# Patient Record
Sex: Female | Born: 1975
Health system: Southern US, Community
[De-identification: ages and names within clinical notes are randomized; demographics above are authoritative.]

## PROBLEM LIST (undated history)

## (undated) DIAGNOSIS — G4700 Insomnia, unspecified: Secondary | ICD-10-CM

## (undated) DIAGNOSIS — Z973 Presence of spectacles and contact lenses: Secondary | ICD-10-CM

## (undated) DIAGNOSIS — J302 Other seasonal allergic rhinitis: Secondary | ICD-10-CM

## (undated) DIAGNOSIS — F411 Generalized anxiety disorder: Secondary | ICD-10-CM

## (undated) DIAGNOSIS — A609 Anogenital herpesviral infection, unspecified: Secondary | ICD-10-CM

## (undated) DIAGNOSIS — Z860101 Personal history of adenomatous and serrated colon polyps: Secondary | ICD-10-CM

## (undated) DIAGNOSIS — F4310 Post-traumatic stress disorder, unspecified: Secondary | ICD-10-CM

## (undated) DIAGNOSIS — F4312 Post-traumatic stress disorder, chronic: Secondary | ICD-10-CM

## (undated) DIAGNOSIS — J449 Chronic obstructive pulmonary disease, unspecified: Secondary | ICD-10-CM

## (undated) DIAGNOSIS — T148XXA Other injury of unspecified body region, initial encounter: Secondary | ICD-10-CM

## (undated) DIAGNOSIS — F3132 Bipolar disorder, current episode depressed, moderate: Secondary | ICD-10-CM

## (undated) DIAGNOSIS — G2581 Restless legs syndrome: Secondary | ICD-10-CM

## (undated) DIAGNOSIS — F32A Depression, unspecified: Secondary | ICD-10-CM

## (undated) DIAGNOSIS — F319 Bipolar disorder, unspecified: Secondary | ICD-10-CM

## (undated) DIAGNOSIS — F419 Anxiety disorder, unspecified: Secondary | ICD-10-CM

## (undated) DIAGNOSIS — J45991 Cough variant asthma: Secondary | ICD-10-CM

## (undated) DIAGNOSIS — K219 Gastro-esophageal reflux disease without esophagitis: Secondary | ICD-10-CM

## (undated) HISTORY — PX: WISDOM TOOTH EXTRACTION: SHX21

## (undated) HISTORY — PX: OTHER SURGICAL HISTORY: SHX169

## (undated) HISTORY — DX: Anogenital herpesviral infection, unspecified: A60.9

## (undated) HISTORY — DX: Other injury of unspecified body region, initial encounter: T14.8XXA

## (undated) HISTORY — DX: Depression, unspecified: F32.A

## (undated) HISTORY — PX: BARTHOLIN CYST MARSUPIALIZATION: SHX5383

## (undated) HISTORY — DX: Other seasonal allergic rhinitis: J30.2

## (undated) HISTORY — DX: Restless legs syndrome: G25.81

## (undated) HISTORY — DX: Anxiety disorder, unspecified: F41.9

---

## 2001-11-22 ENCOUNTER — Other Ambulatory Visit: Admission: RE | Admit: 2001-11-22 | Discharge: 2001-11-22 | Payer: Self-pay | Admitting: Gynecology

## 2002-06-21 ENCOUNTER — Other Ambulatory Visit: Admission: RE | Admit: 2002-06-21 | Discharge: 2002-06-21 | Payer: Self-pay | Admitting: Gynecology

## 2003-01-23 ENCOUNTER — Other Ambulatory Visit: Admission: RE | Admit: 2003-01-23 | Discharge: 2003-01-23 | Payer: Self-pay | Admitting: Gynecology

## 2003-07-04 ENCOUNTER — Other Ambulatory Visit: Admission: RE | Admit: 2003-07-04 | Discharge: 2003-07-04 | Payer: Self-pay | Admitting: Gynecology

## 2004-03-28 ENCOUNTER — Other Ambulatory Visit: Admission: RE | Admit: 2004-03-28 | Discharge: 2004-03-28 | Payer: Self-pay | Admitting: Gynecology

## 2005-04-04 ENCOUNTER — Other Ambulatory Visit: Admission: RE | Admit: 2005-04-04 | Discharge: 2005-04-04 | Payer: Self-pay | Admitting: Gynecology

## 2005-09-16 ENCOUNTER — Inpatient Hospital Stay (HOSPITAL_COMMUNITY): Admission: RE | Admit: 2005-09-16 | Discharge: 2005-09-20 | Payer: Self-pay | Admitting: Psychiatry

## 2005-09-17 ENCOUNTER — Ambulatory Visit: Payer: Self-pay | Admitting: Psychiatry

## 2005-09-21 ENCOUNTER — Inpatient Hospital Stay (HOSPITAL_COMMUNITY): Admission: RE | Admit: 2005-09-21 | Discharge: 2005-09-25 | Payer: Self-pay | Admitting: Psychiatry

## 2006-04-21 ENCOUNTER — Other Ambulatory Visit: Admission: RE | Admit: 2006-04-21 | Discharge: 2006-04-21 | Payer: Self-pay | Admitting: Gynecology

## 2006-05-04 ENCOUNTER — Other Ambulatory Visit (HOSPITAL_COMMUNITY): Admission: RE | Admit: 2006-05-04 | Discharge: 2006-06-03 | Payer: Self-pay | Admitting: Psychiatry

## 2006-05-06 ENCOUNTER — Ambulatory Visit: Payer: Self-pay | Admitting: Psychiatry

## 2006-10-11 ENCOUNTER — Inpatient Hospital Stay (HOSPITAL_COMMUNITY): Admission: AD | Admit: 2006-10-11 | Discharge: 2006-10-15 | Payer: Self-pay | Admitting: Psychiatry

## 2006-10-11 ENCOUNTER — Ambulatory Visit: Payer: Self-pay | Admitting: Psychiatry

## 2006-10-16 ENCOUNTER — Other Ambulatory Visit (HOSPITAL_COMMUNITY): Admission: RE | Admit: 2006-10-16 | Discharge: 2007-01-14 | Payer: Self-pay | Admitting: Psychiatry

## 2013-12-15 HISTORY — PX: BARTHOLIN CYST MARSUPIALIZATION: SHX5383

## 2014-07-05 ENCOUNTER — Encounter (HOSPITAL_COMMUNITY): Payer: Self-pay | Admitting: Emergency Medicine

## 2014-07-05 ENCOUNTER — Emergency Department (INDEPENDENT_AMBULATORY_CARE_PROVIDER_SITE_OTHER)
Admission: EM | Admit: 2014-07-05 | Discharge: 2014-07-05 | Disposition: A | Discharge status: Home / Self Care | Payer: Managed Care, Other (non HMO) | Source: Home / Self Care | Attending: Family Medicine | Admitting: Family Medicine

## 2014-07-05 DIAGNOSIS — N898 Other specified noninflammatory disorders of vagina: Secondary | ICD-10-CM

## 2014-07-05 DIAGNOSIS — R102 Pelvic and perineal pain: Secondary | ICD-10-CM

## 2014-07-05 DIAGNOSIS — N949 Unspecified condition associated with female genital organs and menstrual cycle: Secondary | ICD-10-CM

## 2014-07-05 HISTORY — DX: Bipolar disorder, unspecified: F31.9

## 2014-07-05 LAB — POCT URINALYSIS DIP (DEVICE)
Bilirubin Urine: NEGATIVE
GLUCOSE, UA: NEGATIVE mg/dL
Ketones, ur: NEGATIVE mg/dL
Leukocytes, UA: NEGATIVE
Nitrite: NEGATIVE
PROTEIN: NEGATIVE mg/dL
SPECIFIC GRAVITY, URINE: 1.015 (ref 1.005–1.030)
UROBILINOGEN UA: 0.2 mg/dL (ref 0.0–1.0)
pH: 7 (ref 5.0–8.0)

## 2014-07-05 LAB — POCT PREGNANCY, URINE: Preg Test, Ur: NEGATIVE

## 2014-07-05 MED ORDER — SODIUM BICARBONATE 4 % IV SOLN
INTRAVENOUS | Status: AC
  Filled 2014-07-05: qty 5

## 2014-07-05 MED ORDER — SULFAMETHOXAZOLE-TMP DS 800-160 MG PO TABS: 1.0000 | ORAL_TABLET | Freq: Two times a day (BID) | ORAL | Status: DC

## 2014-07-05 NOTE — ED Provider Notes (Signed)
CSN: [id number]     Arrival date & time 07/05/14  1332 History   First MD Initiated Contact with Patient 07/05/14 1414     Chief Complaint  Patient presents with  . Cyst   (Consider location/radiation/quality/duration/timing/severity/associated sxs/prior Treatment) HPI Comments: 38 year old female presents complaining of a lump on the right side of her vagina. This is been present for about 4 days and is extremely painful to touch. The swelling has gotten gradually worse. There has been no drainage. She has never had this before. No other symptoms. She has been doing sitz baths and applying antibiotic cream at home with no relief   Past Medical History  Diagnosis Date  . Bipolar disorder    History reviewed. No pertinent past surgical history. History reviewed. No pertinent family history. History  Substance Use Topics  . Smoking status: Never Smoker   . Smokeless tobacco: Not on file  . Alcohol Use: Not on file   OB History   Grav Para Term Preterm Abortions TAB SAB Ect Mult Living                 Review of Systems  Genitourinary: Positive for vaginal pain (see history of present illness).  All other systems reviewed and are negative.   Allergies  Review of patient's allergies indicates no known allergies.  Home Medications   Prior to Admission medications   Medication Sig Start Date End Date Taking? Authorizing Provider  FLUoxetine (PROZAC) 20 MG capsule Take 20 mg by mouth daily.   Yes Historical Provider, MD  lamoTRIgine (LAMICTAL) 100 MG tablet Take 75 mg by mouth daily.   Yes Historical Provider, MD  LORazepam (ATIVAN) 2 MG tablet Take 4 mg by mouth every 6 (six) hours as needed for anxiety.   Yes Historical Provider, MD  QUEtiapine (SEROQUEL XR) 400 MG 24 hr tablet Take 400 mg by mouth at bedtime.   Yes Historical Provider, MD  rOPINIRole (REQUIP) 3 MG tablet Take 3 mg by mouth at bedtime.   Yes Historical Provider, MD  zolpidem (AMBIEN) 10 MG tablet Take 10 mg by  mouth at bedtime as needed for sleep.   Yes Historical Provider, MD  sulfamethoxazole-trimethoprim (BACTRIM DS) 800-160 MG per tablet Take 1 tablet by mouth 2 (two) times daily. 07/05/14   Liam Graham, PA-C   BP 111/75  Pulse 88  Temp(Src) 99.4 F (37.4 C) (Oral)  Resp 14  SpO2 97% Physical Exam  Nursing note and vitals reviewed. Constitutional: She is oriented to person, place, and time. Vital signs are normal. She appears well-developed and well-nourished. No distress.  HENT:  Head: Normocephalic and atraumatic.  Pulmonary/Chest: Effort normal. No respiratory distress.  Genitourinary:    There is tenderness on the right labia.  Neurological: She is alert and oriented to person, place, and time. She has normal strength. Coordination normal.  Skin: Skin is warm and dry. No rash noted. She is not diaphoretic.  Psychiatric: She has a normal mood and affect. Judgment normal.    ED Course  Procedures (including critical care time) Labs Review Labs Reviewed  POCT URINALYSIS DIP (DEVICE) - Abnormal; Notable for the following:    Hgb urine dipstick SMALL (*)    All other components within normal limits  POCT PREGNANCY, URINE    Imaging Review No results found.  The tissue was prepped with Betadine swabs x2 and allowed to dry. Field block with local anesthetic, 2% lidocaine with epinephrine mixed 1-10 with bicarb, was injected around the  area with with good anesthesia obtained. A more thorough exam and revealed a firm nodule below this area of swelling that extends superiorly. A single straight incision was made into the area of fluctuance, and there was no drainage or purulence as expected. This incision was expanded slightly with blunt dissection and then probed a bit more, and there is no area to be drained.   MDM   1. Vaginal lesion   2. Vaginal pain    I cannot explain exactly what is causing this patient's symptoms. I expected there to be an abscess, but there was none.  This was too anterior/superior to be a Bartholin's gland in normal anatomy. There may be an infection without an abscess and she just has swelling, there may be a superficial phlebitis or thrombosis. Will attempt to treat this with Bactrim twice a day, if no improvement in 3-4 days she will followup with GYN or just at the [facility] hospital. Continue sitz baths  Discharge Medication List as of 07/05/2014  3:46 PM    START taking these medications   Details  sulfamethoxazole-trimethoprim (BACTRIM DS) 800-160 MG per tablet Take 1 tablet by mouth 2 (two) times daily., Starting 07/05/2014, Until Discontinued, Print           Liam Graham, PA-C 07/05/14 2220

## 2014-07-05 NOTE — ED Notes (Signed)
C/o vaginal irritation & "lump" x 4 days. Minimal relief w OTC and home treatments, antibiotic cream

## 2014-07-07 NOTE — ED Notes (Signed)
Message on answering machine from patient, asking for pain medication, as she was not provided w a Rx . Spoke w Dr Georgina Snell, who dis not see pt (Z Baker not here) patient offered option of returning to [facility] for recheck or to go to [facility] for recheck , if her pain is worsening. Discussed w patient, who seemed agreeable w plan

## 2014-07-07 NOTE — ED Provider Notes (Signed)
Medical screening examination/treatment/procedure(s) were performed by a resident physician or non-physician practitioner and as the supervising physician I was immediately available for consultation/collaboration.  Lynne Leader, MD    Gregor Hams, MD 07/07/14 [phone]

## 2014-07-10 ENCOUNTER — Encounter (HOSPITAL_COMMUNITY): Payer: Self-pay | Admitting: Emergency Medicine

## 2014-07-10 ENCOUNTER — Inpatient Hospital Stay (HOSPITAL_COMMUNITY)
Admission: AD | Admit: 2014-07-10 | Discharge: 2014-07-10 | Discharge status: Against Medical Advice | Payer: Managed Care, Other (non HMO) | Source: Ambulatory Visit | Attending: Obstetrics & Gynecology | Admitting: Obstetrics & Gynecology

## 2014-07-10 ENCOUNTER — Emergency Department (INDEPENDENT_AMBULATORY_CARE_PROVIDER_SITE_OTHER)
Admission: EM | Admit: 2014-07-10 | Discharge: 2014-07-10 | Disposition: A | Discharge status: Home / Self Care | Payer: Managed Care, Other (non HMO) | Source: Home / Self Care | Attending: Family Medicine | Admitting: Family Medicine

## 2014-07-10 DIAGNOSIS — Z5189 Encounter for other specified aftercare: Secondary | ICD-10-CM

## 2014-07-10 DIAGNOSIS — Z4800 Encounter for change or removal of nonsurgical wound dressing: Secondary | ICD-10-CM

## 2014-07-10 NOTE — Discharge Instructions (Signed)
Your wound is healing very well and i would continue sitz baths at home. You may use tylenol, ibuprofen or naproxen as directed on packaging for pain. If symptoms become suddenly worse or severe before you return home, please have yourself re-evaluated at [facility].

## 2014-07-10 NOTE — ED Provider Notes (Signed)
CSN: [id number]     Arrival date & time 07/10/14  1416 History   First MD Initiated Contact with Patient 07/10/14 1430     Chief Complaint  Patient presents with  . Wound Check   (Consider location/radiation/quality/duration/timing/severity/associated sxs/prior Treatment) HPI Comments: Was seen for I&D of small vaginal lesion on 07-05-2014 and is here for re-check. Reports area is still tender but that she is taking tylenol at home for pain.   Patient is a 38 y.o. female presenting with wound check. The history is provided by the patient.  Wound Check    Past Medical History  Diagnosis Date  . Bipolar disorder    History reviewed. No pertinent past surgical history. History reviewed. No pertinent family history. History  Substance Use Topics  . Smoking status: Never Smoker   . Smokeless tobacco: Not on file  . Alcohol Use: Not on file   OB History   Grav Para Term Preterm Abortions TAB SAB Ect Mult Living                 Review of Systems  All other systems reviewed and are negative.   Allergies  Review of patient's allergies indicates no known allergies.  Home Medications   Prior to Admission medications   Medication Sig Start Date End Date Taking? Authorizing Provider  FLUoxetine (PROZAC) 20 MG capsule Take 20 mg by mouth daily.    Historical Provider, MD  lamoTRIgine (LAMICTAL) 100 MG tablet Take 75 mg by mouth daily.    Historical Provider, MD  LORazepam (ATIVAN) 2 MG tablet Take 4 mg by mouth every 6 (six) hours as needed for anxiety.    Historical Provider, MD  QUEtiapine (SEROQUEL XR) 400 MG 24 hr tablet Take 400 mg by mouth at bedtime.    Historical Provider, MD  rOPINIRole (REQUIP) 3 MG tablet Take 3 mg by mouth at bedtime.    Historical Provider, MD  sulfamethoxazole-trimethoprim (BACTRIM DS) 800-160 MG per tablet Take 1 tablet by mouth 2 (two) times daily. 07/05/14   Liam Graham, PA-C  zolpidem (AMBIEN) 10 MG tablet Take 10 mg by mouth at bedtime as  needed for sleep.    Historical Provider, MD   BP 112/74  Pulse 87  Temp(Src) 98.4 F (36.9 C) (Oral)  Resp 20  Ht 5\' 7"  (1.702 m)  Wt 125 lb (56.7 kg)  BMI 19.57 kg/m2  SpO2 96% Physical Exam  Nursing note and vitals reviewed. Constitutional: She is oriented to person, place, and time. She appears well-developed and well-nourished. No distress.  HENT:  Head: Normocephalic and atraumatic.  Eyes: Conjunctivae are normal.  Cardiovascular: Normal rate.   Pulmonary/Chest: Effort normal.  Genitourinary:    Pelvic exam was performed with patient supine.  Musculoskeletal: Normal range of motion.  Neurological: She is alert and oriented to person, place, and time.  Skin: Skin is warm and dry. No rash noted. No erythema.    ED Course  Procedures (including critical care time) Labs Review Labs Reviewed - No data to display  Imaging Review No results found.   MDM   1. Encounter for wound re-check   Wound healing well and without issue. Advised to continue sitz baths as needed and may continue to use tylenol, ibuprofen, or naproxen for pain as directed on packaging.     [place], [place] 07/10/14 1540

## 2014-07-10 NOTE — ED Notes (Signed)
Recheck of perineal lesion w I&D done 7-22. C/o continued pain in her perineal area

## 2014-07-11 NOTE — ED Provider Notes (Signed)
Medical screening examination/treatment/procedure(s) were performed by resident physician or non-physician practitioner and as supervising physician I was immediately available for consultation/collaboration.   Pauline Good MD.   Billy Fischer, MD 07/11/14 [phone]

## 2015-01-21 DIAGNOSIS — F4310 Post-traumatic stress disorder, unspecified: Secondary | ICD-10-CM | POA: Insufficient documentation

## 2016-02-22 ENCOUNTER — Emergency Department (HOSPITAL_COMMUNITY)
Admission: EM | Admit: 2016-02-22 | Discharge: 2016-02-22 | Disposition: A | Discharge status: Home / Self Care | Payer: Commercial Managed Care - HMO | Source: Home / Self Care | Attending: Emergency Medicine | Admitting: Emergency Medicine

## 2016-02-22 ENCOUNTER — Encounter (HOSPITAL_COMMUNITY): Payer: Self-pay | Admitting: *Deleted

## 2016-02-22 DIAGNOSIS — N390 Urinary tract infection, site not specified: Secondary | ICD-10-CM

## 2016-02-22 DIAGNOSIS — N898 Other specified noninflammatory disorders of vagina: Secondary | ICD-10-CM

## 2016-02-22 DIAGNOSIS — R300 Dysuria: Secondary | ICD-10-CM | POA: Diagnosis not present

## 2016-02-22 LAB — POCT URINALYSIS DIP (DEVICE)
BILIRUBIN URINE: NEGATIVE
GLUCOSE, UA: NEGATIVE mg/dL
Hgb urine dipstick: NEGATIVE
Ketones, ur: NEGATIVE mg/dL
NITRITE: NEGATIVE
PH: 6.5 (ref 5.0–8.0)
Protein, ur: NEGATIVE mg/dL
Specific Gravity, Urine: 1.01 (ref 1.005–1.030)
Urobilinogen, UA: 0.2 mg/dL (ref 0.0–1.0)

## 2016-02-22 LAB — POCT PREGNANCY, URINE: PREG TEST UR: NEGATIVE

## 2016-02-22 MED ORDER — FLUCONAZOLE 150 MG PO TABS: 150.0000 mg | ORAL_TABLET | Freq: Once | ORAL | Status: DC

## 2016-02-22 MED ORDER — NITROFURANTOIN MONOHYD MACRO 100 MG PO CAPS: 100.0000 mg | ORAL_CAPSULE | Freq: Two times a day (BID) | ORAL | Status: DC

## 2016-02-22 MED ORDER — PHENAZOPYRIDINE HCL 200 MG PO TABS: 200.0000 mg | ORAL_TABLET | Freq: Three times a day (TID) | ORAL | Status: DC | PRN

## 2016-02-22 NOTE — ED Notes (Signed)
Pt  Reports  Symptoms  Of  Frequency  And  Burning  On  Urination  With  Onset  Of  Symptoms     Yesterday    Pt  Thinks  She  May  Have  A  Yeast  Infection  As  She  Has  Some  Whitish  Discharge  As  Well

## 2016-02-22 NOTE — ED Provider Notes (Signed)
HPI  SUBJECTIVE:  Beth [name] is a 40 y.o. female who presents with UTI like symptoms and a symptoms suggestive of a yeast infection starting yesterday. She reports urinary urgency, frequency, cloudy urine, dysuria, questionable hematuria. She reports non-odorous white vaginal discharge, vulvar itching starting yesterday as well. She reports some occasional nausea, no vomiting or fevers. She tried Azo, Monistat, ice pack. States that ice pack helps. Symptoms are worse with urination. No odorous urine. No genital rash. No  vulvar redness. No abdominal pain, back pain. No recent antibiotics. She is in a long-term monogamous relationship with a female who is asymptomatic. STDs are not a concern today. She states that today's symptoms feel identical to previous yeast infections. Past medical history of yeast infections. No history of UTI, pyelonephritis, nephrolithiasis, diabetes, hypertension. No history of gonorrhea, chlamydia, herpes, HIV, syphilis, BV, Trichomonas. LMP: Earlier this week. PMD: Dr. Bryon Lions.  Past Medical History  Diagnosis Date  . Bipolar disorder ([place])     History reviewed. No pertinent past surgical history.  History reviewed. No pertinent family history.  Social History  Substance Use Topics  . Smoking status: Never Smoker   . Smokeless tobacco: None  . Alcohol Use: None    No current facility-administered medications for this encounter.  Current outpatient prescriptions:  .  ziprasidone (GEODON) 40 MG capsule, Take 40 mg by mouth 2 (two) times daily with a meal., Disp: , Rfl:  .  fluconazole (DIFLUCAN) 150 MG tablet, Take 1 tablet (150 mg total) by mouth once. 1 tab po x 1. May repeat in 72 hours if no improvement, Disp: 2 tablet, Rfl: 1 .  FLUoxetine (PROZAC) 20 MG capsule, Take 20 mg by mouth daily., Disp: , Rfl:  .  lamoTRIgine (LAMICTAL) 100 MG tablet, Take 75 mg by mouth daily., Disp: , Rfl:  .  LORazepam (ATIVAN) 2 MG tablet, Take 4 mg by mouth every 6  (six) hours as needed for anxiety., Disp: , Rfl:  .  nitrofurantoin, macrocrystal-monohydrate, (MACROBID) 100 MG capsule, Take 1 capsule (100 mg total) by mouth 2 (two) times daily. X 5 days, Disp: 10 capsule, Rfl: 0 .  phenazopyridine (PYRIDIUM) 200 MG tablet, Take 1 tablet (200 mg total) by mouth 3 (three) times daily as needed for pain., Disp: 6 tablet, Rfl: 0 .  QUEtiapine (SEROQUEL XR) 400 MG 24 hr tablet, Take 400 mg by mouth at bedtime., Disp: , Rfl:  .  rOPINIRole (REQUIP) 3 MG tablet, Take 3 mg by mouth at bedtime., Disp: , Rfl:  .  zolpidem (AMBIEN) 10 MG tablet, Take 10 mg by mouth at bedtime as needed for sleep., Disp: , Rfl:   No Known Allergies   ROS  As noted in HPI.   Physical Exam  BP 117/72 mmHg  Pulse 67  Temp(Src) 97.7 F (36.5 C) (Oral)  SpO2 100%  LMP 02/17/2016  Constitutional: Well developed, well nourished, no acute distress Eyes:  EOMI, conjunctiva normal bilaterally HENT: Normocephalic, atraumatic,mucus membranes moist Respiratory: Normal inspiratory effort Cardiovascular: Normal rate GI: nondistended soft, nontender. No suprapubic tenderness  back: No CVA tenderness GU: Deferred skin: No rash, skin intact Musculoskeletal: no deformities Neurologic: Alert & oriented x 3, no focal neuro deficits Psychiatric: Speech and behavior appropriate   ED Course   Medications - No data to display  Orders Placed This Encounter  Procedures  . POCT urinalysis dip (device)    Standing Status: Standing     Number of Occurrences: 1     Standing  Expiration Date:   . Pregnancy, urine POC    Standing Status: Standing     Number of Occurrences: 1     Standing Expiration Date:     Results for orders placed or performed during the hospital encounter of 02/22/16 (from the past 24 hour(s))  POCT urinalysis dip (device)     Status: Abnormal   Collection Time: 02/22/16  1:39 PM  Result Value Ref Range   Glucose, UA NEGATIVE NEGATIVE mg/dL   Bilirubin Urine  NEGATIVE NEGATIVE   Ketones, ur NEGATIVE NEGATIVE mg/dL   Specific Gravity, Urine 1.010 1.005 - 1.030   Hgb urine dipstick NEGATIVE NEGATIVE   pH 6.5 5.0 - 8.0   Protein, ur NEGATIVE NEGATIVE mg/dL   Urobilinogen, UA 0.2 0.0 - 1.0 mg/dL   Nitrite NEGATIVE NEGATIVE   Leukocytes, UA SMALL (A) NEGATIVE  Pregnancy, urine POC     Status: None   Collection Time: 02/22/16  1:41 PM  Result Value Ref Range   Preg Test, Ur NEGATIVE NEGATIVE   No results found.  ED Clinical Impression  Dysuria  UTI (lower urinary tract infection)  Vaginal discharge   ED Assessment/Plan  UA has small leukocytes. No hematuria. She is not pregnant.  Patient declined pelvic exam and STD/vaginitis testing. Feel the patient is low risk enough that this can be deferred. She wishes to try treating empirically for UTI and yeast infection. Discussed with patient that we would not be able to rule everything else without doing a pelvic exam, however, patient opted to defer this. If she does not get better in several days, she will return here or follow-up with her primary care physician to be reevaluated.   History and labs most consistent with UTI perhaps with a yeast infection. Will  treat empirically for both with Macrobid and Diflucan. Follow-up with primary care or return here if not getting better. Pt agrees with plan.    *This clinic note was created using Dragon dictation software. Therefore, there may be occasional mistakes despite careful proofreading.  ?     Melynda Ripple, MD 02/22/16 1400

## 2016-08-22 ENCOUNTER — Ambulatory Visit: Payer: Self-pay | Admitting: Gynecology

## 2016-09-15 ENCOUNTER — Ambulatory Visit: Payer: Self-pay | Admitting: Gynecology

## 2016-10-09 ENCOUNTER — Ambulatory Visit: Payer: Self-pay | Admitting: Gynecology

## 2017-01-23 DIAGNOSIS — R69 Illness, unspecified: Secondary | ICD-10-CM | POA: Diagnosis not present

## 2017-03-20 ENCOUNTER — Ambulatory Visit (INDEPENDENT_AMBULATORY_CARE_PROVIDER_SITE_OTHER): Payer: Medicare HMO | Admitting: Gynecology

## 2017-03-20 ENCOUNTER — Encounter: Payer: Self-pay | Admitting: Gynecology

## 2017-03-20 VITALS — BP 112/76 | Ht 67.0 in | Wt 138.0 lb

## 2017-03-20 DIAGNOSIS — Z01419 Encounter for gynecological examination (general) (routine) without abnormal findings: Secondary | ICD-10-CM | POA: Diagnosis not present

## 2017-03-20 DIAGNOSIS — N979 Female infertility, unspecified: Secondary | ICD-10-CM

## 2017-03-20 DIAGNOSIS — Z1151 Encounter for screening for human papillomavirus (HPV): Secondary | ICD-10-CM

## 2017-03-20 DIAGNOSIS — Z8349 Family history of other endocrine, nutritional and metabolic diseases: Secondary | ICD-10-CM

## 2017-03-20 DIAGNOSIS — Z13220 Encounter for screening for lipoid disorders: Secondary | ICD-10-CM | POA: Diagnosis not present

## 2017-03-20 LAB — CBC WITH DIFFERENTIAL/PLATELET
Basophils Absolute: 114 cells/uL (ref 0–200)
Basophils Relative: 2 %
EOS ABS: 399 {cells}/uL (ref 15–500)
Eosinophils Relative: 7 %
HCT: 42.5 % (ref 35.0–45.0)
Hemoglobin: 13.8 g/dL (ref 11.7–15.5)
Lymphocytes Relative: 38 %
Lymphs Abs: 2166 cells/uL (ref 850–3900)
MCH: 30.3 pg (ref 27.0–33.0)
MCHC: 32.5 g/dL (ref 32.0–36.0)
MCV: 93.4 fL (ref 80.0–100.0)
MPV: 10.2 fL (ref 7.5–12.5)
Monocytes Absolute: 570 cells/uL (ref 200–950)
Monocytes Relative: 10 %
Neutro Abs: 2451 cells/uL (ref 1500–7800)
Neutrophils Relative %: 43 %
PLATELETS: 353 10*3/uL (ref 140–400)
RBC: 4.55 MIL/uL (ref 3.80–5.10)
RDW: 12.9 % (ref 11.0–15.0)
WBC: 5.7 10*3/uL (ref 3.8–10.8)

## 2017-03-20 LAB — COMPREHENSIVE METABOLIC PANEL
ALT: 8 U/L (ref 6–29)
AST: 13 U/L (ref 10–30)
Albumin: 4.7 g/dL (ref 3.6–5.1)
Alkaline Phosphatase: 65 U/L (ref 33–115)
BUN: 8 mg/dL (ref 7–25)
CHLORIDE: 106 mmol/L (ref 98–110)
CO2: 25 mmol/L (ref 20–31)
Calcium: 9.8 mg/dL (ref 8.6–10.2)
Creat: 0.88 mg/dL (ref 0.50–1.10)
GLUCOSE: 88 mg/dL (ref 65–99)
Potassium: 4.2 mmol/L (ref 3.5–5.3)
Sodium: 138 mmol/L (ref 135–146)
Total Bilirubin: 0.5 mg/dL (ref 0.2–1.2)
Total Protein: 7.2 g/dL (ref 6.1–8.1)

## 2017-03-20 LAB — LIPID PANEL
Cholesterol: 198 mg/dL (ref <200)
HDL: 63 mg/dL (ref >50)
LDL Cholesterol: 116 mg/dL — ABNORMAL HIGH (ref <100)
Total CHOL/HDL Ratio: 3.1 Ratio (ref <5.0)
Triglycerides: 94 mg/dL (ref <150)
VLDL: 19 mg/dL (ref <30)

## 2017-03-20 LAB — TSH: TSH: 0.96 mIU/L

## 2017-03-20 NOTE — Addendum Note (Signed)
Addended by: Nelva Nay on: 03/20/2017 11:21 AM   Modules accepted: Orders

## 2017-03-20 NOTE — Patient Instructions (Signed)
Call to Schedule your mammogram  Facilities in [place]: 1)  [facility]. [facility], [address] Phone: [phone] 2)  Dr. Bertrand at [facility]  [address] Phone: [phone]     Mammogram A mammogram is an X-ray test to find changes in a woman's breast. You should get a mammogram if:  You are 42 years of age or older  You have risk factors.   Your doctor recommends that you have one.  BEFORE THE TEST  Do not schedule the test the week before your period, especially if your breasts are sore during this time.  On the day of your mammogram:  Wash your breasts and armpits well. After washing, do not put on any deodorant or talcum powder on until after your test.   Eat and drink as you usually do.   Take your medicines as usual.   If you are diabetic and take insulin, make sure you:   Eat before coming for your test.   Take your insulin as usual.   If you cannot keep your appointment, call before the appointment to cancel. Schedule another appointment.  TEST  You will need to undress from the waist up. You will put on a hospital gown.   Your breast will be put on the mammogram machine, and it will press firmly on your breast with a piece of plastic called a compression paddle. This will make your breast flatter so that the machine can X-ray all parts of your breast.   Both breasts will be X-rayed. Each breast will be X-rayed from above and from the side. An X-ray might need to be taken again if the picture is not good enough.   The mammogram will last about 15 to 30 minutes.  AFTER THE TEST Finding out the results of your test Ask when your test results will be ready. Make sure you get your test results.  Document Released: 02/27/2009 Document Revised: 11/20/2011 Document Reviewed: 02/27/2009 Exit[employer] Patient Information 2012 [employer].   

## 2017-03-20 NOTE — Addendum Note (Signed)
Addended by: Joaquin Music on: 03/20/2017 03:46 PM   Modules accepted: Orders

## 2017-03-20 NOTE — Progress Notes (Signed)
    Beth [name] 08-03-1976 [id number]        41 y.o.  G0P0000 for annual exam.  Has not been in the office for a number of years.  Past medical history,surgical history, problem list, medications, allergies, family history and social history were all reviewed and documented as reviewed in the EPIC chart.  ROS:  Performed with pertinent positives and negatives included in the history, assessment and plan.   Additional significant findings :  None   Exam: Caryn Bee assistant Vitals:   03/20/17 1033  BP: 112/76  Weight: 138 lb (62.6 kg)  Height: 5\' 7"  (1.702 m)   Body mass index is 21.61 kg/m.  General appearance:  Normal affect, orientation and appearance. Skin: Grossly normal HEENT: Without gross lesions.  No cervical or supraclavicular adenopathy. Thyroid normal.  Lungs:  Clear without wheezing, rales or rhonchi Cardiac: RR, without RMG Abdominal:  Soft, nontender, without masses, guarding, rebound, organomegaly or hernia Breasts:  Examined lying and sitting without masses, retractions, discharge or axillary adenopathy. Pelvic:  Ext, BUS, Vagina: Normal  Cervix: Normal. Pap smear/HPV  Uterus: Anteverted, normal size, shape and contour, midline and mobile nontender   Adnexa: Without masses or tenderness    Anus and perineum: Normal   Rectovaginal: Normal sphincter tone without palpated masses or tenderness.    Assessment/Plan:  41 y.o. G0P0000 female for annual exam with regular menses no contraception.   1. Contraception. Patient has undergone extensive infertility workup to include failed IVF. Does not want to use contraception and would accept pregnancy if it occurs. Understands that she is on a number of medications that may have adverse effects as far as fetus and she accepts this. Declines referral as far as infertility/prenatal counseling with maternal-fetal. She did ask about checking her fertility reserve. Offered AMH screen and she wants to do this. 2. Pap smear  reported 2 years ago. No records from this. Pap smear/HPV done today. No history of significant abnormal Pap smears previously. 3. Mammography never. Recommended baseline screening mammography with names and numbers provided and she will call to arrange. SBE monthly reviewed. 4. Health maintenance. Patient requests baseline labs. CBC, CMP, lipid profile, TSH due to family history of thyroid dysfunction and urinalysis ordered along with her AMH. Follow up in one year sooner as needed.   Anastasio Auerbach MD, 10:57 AM 03/20/2017

## 2017-03-20 NOTE — Addendum Note (Signed)
Addended by: Joaquin Music on: 03/20/2017 11:52 AM   Modules accepted: Orders

## 2017-03-26 LAB — PAP IG AND HPV HIGH-RISK: HPV DNA HIGH RISK: NOT DETECTED

## 2017-08-27 ENCOUNTER — Other Ambulatory Visit: Payer: Self-pay | Admitting: Gynecology

## 2017-08-27 DIAGNOSIS — Z1231 Encounter for screening mammogram for malignant neoplasm of breast: Secondary | ICD-10-CM

## 2017-09-18 ENCOUNTER — Ambulatory Visit: Payer: Commercial Managed Care - HMO

## 2017-09-24 ENCOUNTER — Ambulatory Visit: Payer: Commercial Managed Care - HMO

## 2018-05-15 DIAGNOSIS — A609 Anogenital herpesviral infection, unspecified: Secondary | ICD-10-CM

## 2018-05-15 HISTORY — DX: Anogenital herpesviral infection, unspecified: A60.9

## 2018-05-28 ENCOUNTER — Encounter: Payer: Self-pay | Admitting: Gynecology

## 2018-05-28 ENCOUNTER — Ambulatory Visit: Payer: Medicare HMO | Admitting: Gynecology

## 2018-05-28 VITALS — BP 118/76

## 2018-05-28 DIAGNOSIS — R102 Pelvic and perineal pain: Secondary | ICD-10-CM | POA: Diagnosis not present

## 2018-05-28 NOTE — Progress Notes (Signed)
    Meleny Tregoning [birth year] [id number]        42 y.o.  G0P0000 resents complaining of questionable labial cyst.  Had pain at the introital opening of the last several weeks.  No precipitating events.  No drainage, discharge, irritation or odor.  Feels uncomfortable to the touch.  Past medical history,surgical history, problem list, medications, allergies, family history and social history were all reviewed and documented in the EPIC chart.  Directed ROS with pertinent positives and negatives documented in the history of present illness/assessment and plan.  Exam: Caryn Bee assistant Vitals:   05/28/18 1009  BP: 118/76   General appearance:  Normal External BUS vagina grossly normal.  The area she is pointing to is at the left posterior fourchette region.  There is no mucosal changes such as erythema, ulcerations or skin breaks.  No palpable underlying induration or masses.  Vaginal exam is normal.  Cervix normal.  Uterus normal size midline mobile nontender.  Adnexa without masses or tenderness  Assessment/Plan:  42 y.o. G0P0000 with reported discomfort at the introital opening on the left.  No evidence of disease on exam.  We discussed the differential to include viral although no skin breaks or other changes to suggest HSV outbreak.  She really has no classic presentation for any etiology such as infectious/boil/Bartholin's.  Very superficial skin and not vaginismus like.  I recommended the patient apply heat to this area and to monitor for now.  She will follow-up if it persists or certainly worsens.  If it totally resolves then will follow expectantly.    Anastasio Auerbach MD, 10:32 AM 05/28/2018

## 2018-05-28 NOTE — Patient Instructions (Signed)
Follow-up if the pain continues.

## 2018-05-31 ENCOUNTER — Telehealth: Payer: Self-pay | Admitting: *Deleted

## 2018-05-31 MED ORDER — FLUCONAZOLE 150 MG PO TABS: 150.0000 mg | ORAL_TABLET | Freq: Once | ORAL | 0 refills | Status: AC

## 2018-05-31 NOTE — Telephone Encounter (Signed)
Rx sent, pt aware 

## 2018-05-31 NOTE — Telephone Encounter (Signed)
Patient called to follow up from [place] on 05/28/18 c/o vaginal itching internally/externally along with irritation, no discharge, no odor. Pt asked if she could have Rx for possible yeast infection? Please advise

## 2018-05-31 NOTE — Telephone Encounter (Signed)
Diflucan 150 mg x 1 dose okay. 

## 2018-06-04 ENCOUNTER — Encounter: Payer: Self-pay | Admitting: Gynecology

## 2018-06-04 ENCOUNTER — Ambulatory Visit: Payer: Medicare PPO | Admitting: Gynecology

## 2018-06-04 ENCOUNTER — Telehealth: Payer: Self-pay | Admitting: *Deleted

## 2018-06-04 VITALS — BP 118/76

## 2018-06-04 DIAGNOSIS — N766 Ulceration of vulva: Secondary | ICD-10-CM

## 2018-06-04 MED ORDER — VALACYCLOVIR HCL 500 MG PO TABS
500.0000 mg | ORAL_TABLET | Freq: Two times a day (BID) | ORAL | 0 refills | Status: DC
Start: 2018-06-04 — End: 2018-06-08

## 2018-06-04 MED ORDER — LIDOCAINE 5 % EX OINT: 1.0000 "application " | TOPICAL_OINTMENT | CUTANEOUS | 0 refills | Status: DC | PRN

## 2018-06-04 NOTE — Telephone Encounter (Signed)
Pt informed will be here in 30 minutes.

## 2018-06-04 NOTE — Patient Instructions (Signed)
Take the Valtrex medication twice daily for 10 days. Apply the lidocaine ointment to the tender areas as needed. Office will call you with the culture results.

## 2018-06-04 NOTE — Progress Notes (Signed)
    Sala Tague [birth year] [id number]        42 y.o.  G0P0000 presents with continuing vulvar pain.  She was seen 05/28/2018 with left lower vulvar pain although no findings on physical exam.  She now presents with right upper vulvar pain and a "white" area.  Past medical history,surgical history, problem list, medications, allergies, family history and social history were all reviewed and documented in the EPIC chart.  Directed ROS with pertinent positives and negatives documented in the history of present illness/assessment and plan.  Exam: Caryn Bee assistant Vitals:   06/04/18 1117  BP: 118/76   General appearance:  Normal External BUS vagina with punctated ulcer upper right labia minora extremely tender to touch.  No cellulitis surrounding.  Tender right inguinal area without significant adenopathy.  With  Assessment/Plan:  42 y.o. 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  PCR testing done.  Discussed my suspicion with the patient.  She understands I am not guaranteeing this is the diagnosis but suspicious for this.  I suspect that the left discomfort she was having previously might have been an abortive outbreak without cutaneous manifestation.  Recommend patient start on Valtrex 500 mg twice daily x1 week.  Lidocaine ointment as needed for her discomfort.  Follow-up on the PCR results.  We had a lengthy discussion about HSV and various options to include daily suppressive Valtrex versus intermittent outbreak treatment.  Patient will follow-up for the PCR results and then will further discuss at that time.    Anastasio Auerbach MD, 11:34 AM 06/04/2018

## 2018-06-04 NOTE — Telephone Encounter (Signed)
Have her come right in 

## 2018-06-04 NOTE — Addendum Note (Signed)
Addended by: Nelva Nay on: 06/04/2018 12:00 PM   Modules accepted: Orders

## 2018-06-04 NOTE — Telephone Encounter (Signed)
Patient called back to follow up from [place] on 05/28/18, states now she has a white bump Vulvar area, painful with wiping and walking, using warming compressions and no relief. Would you like to work patient in for [place] today? Please advise

## 2018-06-07 LAB — SURESWAB HSV, TYPE 1/2 DNA, PCR
HSV 1 DNA: NOT DETECTED
HSV 2 DNA: DETECTED — AB

## 2018-06-08 ENCOUNTER — Other Ambulatory Visit: Payer: Self-pay | Admitting: Gynecology

## 2018-06-08 ENCOUNTER — Encounter: Payer: Self-pay | Admitting: Gynecology

## 2018-06-08 MED ORDER — VALACYCLOVIR HCL 500 MG PO TABS: ORAL_TABLET | ORAL | 6 refills | Status: DC

## 2018-06-14 ENCOUNTER — Telehealth: Payer: Self-pay

## 2018-06-14 MED ORDER — VALACYCLOVIR HCL 1 G PO TABS: 1000.0000 mg | ORAL_TABLET | Freq: Two times a day (BID) | ORAL | 0 refills | Status: DC

## 2018-06-14 NOTE — Telephone Encounter (Signed)
Patient informed. Rx sent 

## 2018-06-14 NOTE — Addendum Note (Signed)
Addended by: Ramond Craver on: 06/14/2018 02:44 PM   Modules accepted: Orders

## 2018-06-14 NOTE — Telephone Encounter (Signed)
I would increase the Valtrex to 1000 mg twice daily x10 days.

## 2018-06-14 NOTE — Telephone Encounter (Signed)
Patient just finished Valtrex x 10 days. She is still experiencing symptoms and is still very uncomfortable. It really did not help much. She said what concerns her is on the left side if feels like it is coming back again. Feels like it felt when it first started.

## 2018-07-12 ENCOUNTER — Ambulatory Visit: Payer: Medicare PPO | Admitting: Gynecology

## 2018-07-12 ENCOUNTER — Encounter: Payer: Self-pay | Admitting: Gynecology

## 2018-07-12 VITALS — BP 122/78 | Ht 67.0 in | Wt 152.0 lb

## 2018-07-12 DIAGNOSIS — Z01419 Encounter for gynecological examination (general) (routine) without abnormal findings: Secondary | ICD-10-CM

## 2018-07-12 DIAGNOSIS — Z9189 Other specified personal risk factors, not elsewhere classified: Secondary | ICD-10-CM | POA: Diagnosis not present

## 2018-07-12 DIAGNOSIS — Z13220 Encounter for screening for lipoid disorders: Secondary | ICD-10-CM

## 2018-07-12 DIAGNOSIS — A609 Anogenital herpesviral infection, unspecified: Secondary | ICD-10-CM | POA: Diagnosis not present

## 2018-07-12 LAB — COMPREHENSIVE METABOLIC PANEL
AG Ratio: 2.2 (calc) (ref 1.0–2.5)
ALBUMIN MSPROF: 4.8 g/dL (ref 3.6–5.1)
ALT: 43 U/L — ABNORMAL HIGH (ref 6–29)
AST: 28 U/L (ref 10–30)
Alkaline phosphatase (APISO): 75 U/L (ref 33–115)
BILIRUBIN TOTAL: 0.4 mg/dL (ref 0.2–1.2)
BUN: 11 mg/dL (ref 7–25)
CALCIUM: 9.4 mg/dL (ref 8.6–10.2)
CO2: 27 mmol/L (ref 20–32)
CREATININE: 0.69 mg/dL (ref 0.50–1.10)
Chloride: 103 mmol/L (ref 98–110)
Globulin: 2.2 g/dL (calc) (ref 1.9–3.7)
Glucose, Bld: 94 mg/dL (ref 65–99)
POTASSIUM: 4.3 mmol/L (ref 3.5–5.3)
Sodium: 137 mmol/L (ref 135–146)
Total Protein: 7 g/dL (ref 6.1–8.1)

## 2018-07-12 LAB — CBC WITH DIFFERENTIAL/PLATELET
Basophils Absolute: 88 cells/uL (ref 0–200)
Basophils Relative: 1.4 %
EOS ABS: 239 {cells}/uL (ref 15–500)
Eosinophils Relative: 3.8 %
HEMATOCRIT: 40.4 % (ref 35.0–45.0)
Hemoglobin: 14 g/dL (ref 11.7–15.5)
LYMPHS ABS: 1884 {cells}/uL (ref 850–3900)
MCH: 31.9 pg (ref 27.0–33.0)
MCHC: 34.7 g/dL (ref 32.0–36.0)
MCV: 92 fL (ref 80.0–100.0)
MPV: 10.8 fL (ref 7.5–12.5)
Monocytes Relative: 9.4 %
NEUTROS PCT: 55.5 %
Neutro Abs: 3497 cells/uL (ref 1500–7800)
PLATELETS: 336 10*3/uL (ref 140–400)
RBC: 4.39 10*6/uL (ref 3.80–5.10)
RDW: 12.8 % (ref 11.0–15.0)
TOTAL LYMPHOCYTE: 29.9 %
WBC: 6.3 10*3/uL (ref 3.8–10.8)
WBCMIX: 592 {cells}/uL (ref 200–950)

## 2018-07-12 LAB — LIPID PANEL
CHOL/HDL RATIO: 3.3 (calc) (ref <5.0)
Cholesterol: 198 mg/dL (ref <200)
HDL: 60 mg/dL (ref >50)
LDL Cholesterol (Calc): 117 mg/dL (calc) — ABNORMAL HIGH
Non-HDL Cholesterol (Calc): 138 mg/dL (calc) — ABNORMAL HIGH (ref <130)
Triglycerides: 106 mg/dL (ref <150)

## 2018-07-12 MED ORDER — VALACYCLOVIR HCL 500 MG PO TABS: 500.0000 mg | ORAL_TABLET | Freq: Every day | ORAL | 12 refills | Status: DC

## 2018-07-12 NOTE — Progress Notes (Signed)
    Beth [name] 01-12-1976 [id number]        42 y.o.  G0P0000 for annual gynecologic exam.  Recently diagnosed with HSV.  Past medical history,surgical history, problem list, medications, allergies, family history and social history were all reviewed and documented as reviewed in the EPIC chart.  ROS:  Performed with pertinent positives and negatives included in the history, assessment and plan.   Additional significant findings : None   Exam: Wandra Scot assistant Vitals:   07/12/18 0832  BP: 122/78  Weight: 152 lb (68.9 kg)  Height: 5\' 7"  (1.702 m)   Body mass index is 23.81 kg/m.  General appearance:  Normal affect, orientation and appearance. Skin: Grossly normal HEENT: Without gross lesions.  No cervical or supraclavicular adenopathy. Thyroid normal.  Lungs:  Clear without wheezing, rales or rhonchi Cardiac: RR, without RMG Abdominal:  Soft, nontender, without masses, guarding, rebound, organomegaly or hernia Breasts:  Examined lying and sitting without masses, retractions, discharge or axillary adenopathy. Pelvic:  Ext, BUS, Vagina: Normal  Cervix: Normal  Uterus: Anteverted, normal size, shape and contour, midline and mobile nontender   Adnexa: Without masses or tenderness    Anus and perineum: Normal   Rectovaginal: Normal sphincter tone without palpated masses or tenderness.    Assessment/Plan:  42 y.o. G0P0000 female for annual gynecologic exam with regular menses, no contraception.   1. No contraception.  Patient accepting pregnancy if recurs.  Is on multivitamin with folic acid.  Underwent infertility evaluation with failed IVF. 2. Pap smear/HPV 2018.  No Pap smear done today.  No history of abnormal Pap smears previously.  Plan repeat Pap smear/HPV at 5-year interval per current screening guidelines 3. Mammography never.  Again strongly recommended screening mammography.  Breast exam normal today. 4. HSV.  Recent vulvar outbreak with diagnoses.  PCR HSV-2  positive.  We discussed again options to include intermittent outbreak treatment with Valtrex versus daily suppression.  At this point the patient feels more comfortable with daily suppression.  Valtrex 500 mg daily refill x1 year provided. 5. Health maintenance.  CBC, CMP, lipid profile ordered.  Follow-up 1 year, sooner as needed.   Anastasio Auerbach MD, 8:58 AM 07/12/2018

## 2018-07-12 NOTE — Patient Instructions (Addendum)
Start on the Valtrex daily as we discussed.  Call me if you have any issues with this.  Follow-up in 1 year for annual exam   Call to Schedule your mammogram  Facilities in [place]: 1)  [facility]. [address] [place] Phone: [phone])  Dr. Isaiah Blakes at [facility] [address] Phone: [phone]     Mammogram A mammogram is an X-ray test to find changes in a woman's breast. You should get a mammogram if:  You are 18 years of age or older  You have risk factors.   Your doctor recommends that you have one.  BEFORE THE TEST  Do not schedule the test the week before your period, especially if your breasts are sore during this time.  On the day of your mammogram:  Wash your breasts and armpits well. After washing, do not put on any deodorant or talcum powder on until after your test.   Eat and drink as you usually do.   Take your medicines as usual.   If you are diabetic and take insulin, make sure you:   Eat before coming for your test.   Take your insulin as usual.   If you cannot keep your appointment, call before the appointment to cancel. Schedule another appointment.  TEST  You will need to undress from the waist up. You will put on a hospital gown.   Your breast will be put on the mammogram machine, and it will press firmly on your breast with a piece of plastic called a compression paddle. This will make your breast flatter so that the machine can X-ray all parts of your breast.   Both breasts will be X-rayed. Each breast will be X-rayed from above and from the side. An X-ray might need to be taken again if the picture is not good enough.   The mammogram will last about 15 to 30 minutes.  AFTER THE TEST Finding out the results of your test Ask when your test results will be ready. Make sure you get your test results.  Document Released: 02/27/2009 Document Revised: 11/20/2011  Document Reviewed: 02/27/2009 [facility] Patient Information 2012 [place].

## 2018-07-13 ENCOUNTER — Other Ambulatory Visit: Payer: Self-pay | Admitting: Gynecology

## 2018-07-13 DIAGNOSIS — R7989 Other specified abnormal findings of blood chemistry: Secondary | ICD-10-CM

## 2018-07-13 DIAGNOSIS — R945 Abnormal results of liver function studies: Principal | ICD-10-CM

## 2018-07-29 ENCOUNTER — Other Ambulatory Visit: Payer: Self-pay | Admitting: Gynecology

## 2018-07-29 DIAGNOSIS — Z1231 Encounter for screening mammogram for malignant neoplasm of breast: Secondary | ICD-10-CM

## 2018-08-19 ENCOUNTER — Telehealth: Payer: Self-pay | Admitting: *Deleted

## 2018-08-19 MED ORDER — VALACYCLOVIR HCL 500 MG PO TABS: ORAL_TABLET | ORAL | 4 refills | Status: DC

## 2018-08-19 NOTE — Telephone Encounter (Signed)
Patient called stating she takes the Valtex 500 mg daily, outbreak started last night. Asked what to do I told her she can take Rx twice daily for 3-5 days and then go back to daily. She asked if I could send new Rx in for with these directions. Rx sent for 3 month supply.

## 2018-08-25 ENCOUNTER — Ambulatory Visit: Payer: Commercial Managed Care - HMO

## 2018-09-02 ENCOUNTER — Other Ambulatory Visit: Payer: Self-pay | Admitting: Psychiatry

## 2018-09-07 LAB — HEPATIC FUNCTION PANEL
AG RATIO: 1.9 (calc) (ref 1.0–2.5)
ALT: 13 U/L (ref 6–29)
AST: 19 U/L (ref 10–30)
Albumin: 4.9 g/dL (ref 3.6–5.1)
Alkaline phosphatase (APISO): 63 U/L (ref 33–115)
BILIRUBIN DIRECT: 0.1 mg/dL (ref 0.0–0.2)
BILIRUBIN INDIRECT: 0.5 mg/dL (ref 0.2–1.2)
BILIRUBIN TOTAL: 0.6 mg/dL (ref 0.2–1.2)
GLOBULIN: 2.6 g/dL (ref 1.9–3.7)
Total Protein: 7.5 g/dL (ref 6.1–8.1)

## 2018-09-07 LAB — VITAMIN B12: Vitamin B-12: 343 pg/mL (ref 200–1100)

## 2018-09-07 LAB — IRON, TOTAL/TOTAL IRON BINDING CAP
%SAT: 19 % (calc) (ref 16–45)
Iron: 69 ug/dL (ref 40–190)
TIBC: 361 mcg/dL (calc) (ref 250–450)

## 2018-09-07 LAB — LAMOTRIGINE LEVEL: LAMOTRIGINE LVL: 3 ug/mL — AB (ref 4.0–18.0)

## 2018-09-10 ENCOUNTER — Encounter: Payer: Self-pay | Admitting: *Deleted

## 2018-09-17 ENCOUNTER — Encounter: Payer: Self-pay | Admitting: Neurology

## 2018-09-17 ENCOUNTER — Ambulatory Visit: Payer: Medicare PPO | Admitting: Psychiatry

## 2018-09-17 ENCOUNTER — Encounter: Payer: Self-pay | Admitting: Psychiatry

## 2018-09-17 VITALS — BP 97/62 | HR 61 | Ht 67.0 in

## 2018-09-17 DIAGNOSIS — F4310 Post-traumatic stress disorder, unspecified: Secondary | ICD-10-CM | POA: Diagnosis not present

## 2018-09-17 DIAGNOSIS — G2581 Restless legs syndrome: Secondary | ICD-10-CM

## 2018-09-17 DIAGNOSIS — F411 Generalized anxiety disorder: Secondary | ICD-10-CM

## 2018-09-17 DIAGNOSIS — F315 Bipolar disorder, current episode depressed, severe, with psychotic features: Secondary | ICD-10-CM

## 2018-09-17 DIAGNOSIS — F5101 Primary insomnia: Secondary | ICD-10-CM

## 2018-09-17 MED ORDER — LAMOTRIGINE 200 MG PO TABS: 300.0000 mg | ORAL_TABLET | Freq: Every day | ORAL | 2 refills | Status: DC

## 2018-09-17 MED ORDER — CLONAZEPAM 0.5 MG PO TABS: ORAL_TABLET | ORAL | 1 refills | Status: DC

## 2018-09-17 MED ORDER — ESZOPICLONE 3 MG PO TABS: 3.0000 mg | ORAL_TABLET | Freq: Every day | ORAL | 1 refills | Status: DC

## 2018-09-17 MED ORDER — DOXAZOSIN MESYLATE 2 MG PO TABS: ORAL_TABLET | ORAL | 1 refills | Status: DC

## 2018-09-17 MED ORDER — DIVALPROEX SODIUM ER 500 MG PO TB24
500.0000 mg | ORAL_TABLET | Freq: Every day | ORAL | 1 refills | Status: DC
Start: 2018-09-17 — End: 2018-09-22

## 2018-09-17 MED ORDER — SERTRALINE HCL 100 MG PO TABS: 100.0000 mg | ORAL_TABLET | Freq: Every day | ORAL | 2 refills | Status: DC

## 2018-09-17 MED ORDER — ROPINIROLE HCL 4 MG PO TABS: 8.0000 mg | ORAL_TABLET | Freq: Every day | ORAL | 2 refills | Status: DC

## 2018-09-17 NOTE — Progress Notes (Signed)
Beth [name] [id number] 09/06/1976 42 y.o.  Subjective:   Patient ID:  Beth [name] is a 42 y.o. (DOB 1976-11-09) female.  Chief Complaint:  Chief Complaint  Patient presents with  . Depression  . Anxiety  . Insomnia    HPI Beth [name] presents to the office today for follow-up of mood, anxiety, and insomnia. Reports that she is repeatedly awakening at night and has difficulty returning to sleep. Reports frequent nightmares and that this significantly contributes to sleep disturbance.  Reports intrusive memories and flashbacks. Denies change in appetite.  She reports worsening RLS and leg pain. Describes mood as "more depressed." Denies any change in irritability. Describes anxiety as "high." Energy has been low and c/o feeling "tired." Motivation is also low. She c/o difficulty with concentration and focus. Denies any recent hypomanic s/s to include impulsive or risky behaviors. Reports having SI recently without intent. Denies current SI. Denies paranoia.  Medications: I have reviewed the patient's current medications. Side effects include RLS and n/v.  Allergies: No Known Allergies  Past Medical History:  Diagnosis Date  . Bipolar disorder ([place])   . HSV (herpes simplex virus) anogenital infection 05/2018  . RLS (restless legs syndrome)     Past Surgical History:  Procedure Laterality Date  . BARTHOLIN CYST MARSUPIALIZATION    . IVF      Family History  Problem Relation Age of Onset  . Thyroid disease Mother   . COPD Mother   . Bipolar disorder Mother   . Parkinson's disease Father   . Breast cancer Paternal Grandmother 57  . Bipolar disorder Sister   . Schizophrenia Other     Social History   Socioeconomic History  . Marital status: Married    Spouse name: Not on file  . Number of children: Not on file  . Years of education: Not on file  . Highest education level: Not on file  Occupational History  . Not on file  Social Needs  . Financial resource  strain: Not on file  . Food insecurity:    Worry: Not on file    Inability: Not on file  . Transportation needs:    Medical: Not on file    Non-medical: Not on file  Tobacco Use  . Smoking status: Former Research scientist (life sciences)  . Smokeless tobacco: Never Used  Substance and Sexual Activity  . Alcohol use: No  . Drug use: No  . Sexual activity: Yes    Birth control/protection: None    Comment: 1st intercourse 17 yo-5 partners  Lifestyle  . Physical activity:    Days per week: Not on file    Minutes per session: Not on file  . Stress: Not on file  Relationships  . Social connections:    Talks on phone: Not on file    Gets together: Not on file    Attends religious service: Not on file    Active member of club or organization: Not on file    Attends meetings of clubs or organizations: Not on file    Relationship status: Not on file  . Intimate partner violence:    Fear of current or ex partner: Not on file    Emotionally abused: Not on file    Physically abused: Not on file    Forced sexual activity: Not on file  Other Topics Concern  . Not on file  Social History Narrative  . Not on file    Past Medical History, Surgical history, Social history, and Family history were  reviewed and updated as appropriate.   Please see review of systems for further details on the patient's review from today.   Review of Systems:  Review of Systems  Gastrointestinal: Positive for vomiting.  Musculoskeletal: Negative for gait problem.  Allergic/Immunologic: Negative.   Neurological: Negative for tremors.       Reports worsening RLS and legs hurting    Objective:   Physical Exam:  BP 97/62   Pulse 61   Ht 5\' 7"  (1.702 m)   BMI 23.81 kg/m   Physical Exam  Constitutional: She is oriented to person, place, and time. She appears well-developed. No distress.  Musculoskeletal: Normal range of motion.  Neurological: She is alert and oriented to person, place, and time. Coordination normal.   Psychiatric: Her speech is normal. Judgment and thought content normal. Her mood appears anxious. Her affect is not angry, not blunt, not labile and not inappropriate. She is slowed and withdrawn. Cognition and memory are normal. She exhibits a depressed mood. She expresses no homicidal and no suicidal ideation. She expresses no suicidal plans and no homicidal plans.    Lab Review:     Component Value Date/Time   NA 137 07/12/2018 0859   K 4.3 07/12/2018 0859   CL 103 07/12/2018 0859   CO2 27 07/12/2018 0859   GLUCOSE 94 07/12/2018 0859   BUN 11 07/12/2018 0859   CREATININE 0.69 07/12/2018 0859   CALCIUM 9.4 07/12/2018 0859   PROT 7.5 09/02/2018 1058   ALBUMIN 4.7 03/20/2017 1100   AST 19 09/02/2018 1058   ALT 13 09/02/2018 1058   ALKPHOS 65 03/20/2017 1100   BILITOT 0.6 09/02/2018 1058       Component Value Date/Time   WBC 6.3 07/12/2018 0859   RBC 4.39 07/12/2018 0859   HGB 14.0 07/12/2018 0859   HCT 40.4 07/12/2018 0859   PLT 336 07/12/2018 0859   MCV 92.0 07/12/2018 0859   MCH 31.9 07/12/2018 0859   MCHC 34.7 07/12/2018 0859   RDW 12.8 07/12/2018 0859   LYMPHSABS 1,884 07/12/2018 0859   MONOABS 570 03/20/2017 1100   EOSABS 239 07/12/2018 0859   BASOSABS 88 07/12/2018 0859     Assessment: Plan:    Patient seen for 30 minutes and greater than 50% of visit spent counseling patient regarding potential treatment options to include ECT considering limited improvement in signs and symptoms with multiple medications.  Case was staffed with Dr. Clovis Pu who recommended having patient scheduled to see him for an hour for a second opinion.  Will also request neurology consult for RLE's that has been unimproved with Requip, gabapentin, Lyrica, benzodiazepines, or reduction in atypical antipsychotics.  Will decrease Depakote ER to 500 mg at bedtime since this may be causing her nausea.  Will increase lamotrigine to 300 mg p.o. daily since patient reports that lamotrigine may have  been the most beneficial for her mood symptoms.  We will also start doxazosin 2 mg one half tab p.o. nightly for 1 week, then increase to 1 tablet p.o. nightly if tolerated for nightmares since patient reports that nightmares significantly interfere with her sleep.  Discussed with patient that doxazosin could lower blood pressure and pulse and to discontinue if dizziness occurs or pulse is low.  Patient reports that she will monitor her pulse and discontinue if pulse is lower than 60.  Patient to follow-up with this provider in 4 to 5 weeks or sooner if clinically indicated. Bipolar disorder, current episode depressed, severe, with psychotic features ([place]) -  Plan: divalproex (DEPAKOTE ER) 500 MG 24 hr tablet, lamoTRIgine (LAMICTAL) 200 MG tablet, sertraline (ZOLOFT) 100 MG tablet  Primary insomnia - Plan: doxazosin (CARDURA) 2 MG tablet, Eszopiclone (ESZOPICLONE) 3 MG TABS  Generalized anxiety disorder - Plan: clonazePAM (KLONOPIN) 0.5 MG tablet, sertraline (ZOLOFT) 100 MG tablet  PTSD (post-traumatic stress disorder) - Plan: doxazosin (CARDURA) 2 MG tablet, sertraline (ZOLOFT) 100 MG tablet  RLS (restless legs syndrome) - Plan: rOPINIRole (REQUIP) 4 MG tablet, Ambulatory referral to Neurology  Please see After Visit Summary for patient specific instructions.  Future Appointments  Date Time Provider [place]  09/21/2018  4:20 PM [facility]    Orders Placed This Encounter  Procedures  . Ambulatory referral to Neurology      -------------------------------

## 2018-09-20 ENCOUNTER — Other Ambulatory Visit: Payer: Self-pay

## 2018-09-21 ENCOUNTER — Telehealth: Payer: Self-pay | Admitting: Psychiatry

## 2018-09-21 ENCOUNTER — Ambulatory Visit
Admission: RE | Admit: 2018-09-21 | Discharge: 2018-09-21 | Disposition: A | Discharge status: Home / Self Care | Payer: Commercial Managed Care - HMO | Source: Ambulatory Visit | Attending: Gynecology | Admitting: Gynecology

## 2018-09-21 DIAGNOSIS — Z1231 Encounter for screening mammogram for malignant neoplasm of breast: Secondary | ICD-10-CM

## 2018-09-21 DIAGNOSIS — F411 Generalized anxiety disorder: Secondary | ICD-10-CM

## 2018-09-21 DIAGNOSIS — F315 Bipolar disorder, current episode depressed, severe, with psychotic features: Secondary | ICD-10-CM

## 2018-09-21 MED ORDER — CLONAZEPAM 0.5 MG PO TABS: ORAL_TABLET | ORAL | 0 refills | Status: DC

## 2018-09-21 MED ORDER — ASENAPINE MALEATE 5 MG SL SUBL: 5.0000 mg | SUBLINGUAL_TABLET | Freq: Every day | SUBLINGUAL | 1 refills | Status: DC

## 2018-09-21 NOTE — Telephone Encounter (Signed)
Pt called and reported to staff that she is not sleeping and asks about resuming Saphris. She also reports clarification is needed on Klonopin QID since quantity sent was #30.   Saphris 5 mg SL QHS sent and Klonopin QID #120 sent to pharmacy and pt notified in My Chart.

## 2018-09-22 ENCOUNTER — Other Ambulatory Visit: Payer: Self-pay

## 2018-09-22 DIAGNOSIS — F315 Bipolar disorder, current episode depressed, severe, with psychotic features: Secondary | ICD-10-CM

## 2018-09-22 MED ORDER — DIVALPROEX SODIUM ER 250 MG PO TB24: 750.0000 mg | ORAL_TABLET | Freq: Every day | ORAL | 0 refills | Status: DC

## 2018-09-29 ENCOUNTER — Telehealth: Payer: Self-pay | Admitting: *Deleted

## 2018-09-29 ENCOUNTER — Other Ambulatory Visit: Payer: Self-pay | Admitting: Gynecology

## 2018-09-29 ENCOUNTER — Other Ambulatory Visit: Payer: Self-pay

## 2018-09-29 DIAGNOSIS — F315 Bipolar disorder, current episode depressed, severe, with psychotic features: Secondary | ICD-10-CM

## 2018-09-29 MED ORDER — DIVALPROEX SODIUM ER 250 MG PO TB24: 750.0000 mg | ORAL_TABLET | Freq: Every day | ORAL | 0 refills | Status: DC

## 2018-09-29 NOTE — Telephone Encounter (Signed)
Patent called had screening mammogram this month and was told by radiologist Georgina Snell to tell me his name) yesterday while she was with her mother's appointment at breast center she will need to have a MRI of breast due to dense breast and being "high risk". I told her the mammogram result in chart notes screening mammogram in 1 year, so no documentation to confirm this "high risk". I told her I would relay to you. Please advise

## 2018-09-29 NOTE — Telephone Encounter (Signed)
Recommend forwarding message to radiologist who read the films for their response

## 2018-09-29 NOTE — Telephone Encounter (Signed)
I explained to patient and advised to call breast center so she can have the radiologist can enter this information in chart.

## 2018-10-11 ENCOUNTER — Telehealth: Payer: Self-pay | Admitting: Gynecology

## 2018-10-11 DIAGNOSIS — G4700 Insomnia, unspecified: Secondary | ICD-10-CM

## 2018-10-11 DIAGNOSIS — F319 Bipolar disorder, unspecified: Secondary | ICD-10-CM

## 2018-10-11 DIAGNOSIS — F411 Generalized anxiety disorder: Secondary | ICD-10-CM

## 2018-10-11 NOTE — Telephone Encounter (Signed)
Tell patient that I spoke with the radiologist who said that they did a risk calculation and that they discussed possible MRI of the breast.  In review of the patient's history she has a paternal grandmother with breast cancer.  Her mammogram showed a breast density category C which is not the densest reading.  Just looking at these factors it does not appear MRI is indicated and my fear is that the insurance company will not pay for it and it would be expensive for her.  If she would like she could discuss her situation with the genetic counselor who can run several risk calculations and if they determined that her risk of breast cancer exceeds 20% lifetime risk then MRI is clearly recommended.  If she is interested in this then arrange an appointment for her.

## 2018-10-12 ENCOUNTER — Encounter: Payer: Self-pay | Admitting: *Deleted

## 2018-10-12 NOTE — Telephone Encounter (Signed)
Sent mychart message

## 2018-10-18 ENCOUNTER — Ambulatory Visit (INDEPENDENT_AMBULATORY_CARE_PROVIDER_SITE_OTHER): Payer: Medicare PPO | Admitting: Neurology

## 2018-10-18 ENCOUNTER — Other Ambulatory Visit: Payer: Self-pay

## 2018-10-18 ENCOUNTER — Other Ambulatory Visit (INDEPENDENT_AMBULATORY_CARE_PROVIDER_SITE_OTHER): Payer: Medicare PPO

## 2018-10-18 ENCOUNTER — Encounter: Payer: Self-pay | Admitting: Neurology

## 2018-10-18 VITALS — BP 108/76 | HR 80 | Ht 67.0 in | Wt 166.0 lb

## 2018-10-18 DIAGNOSIS — G4700 Insomnia, unspecified: Secondary | ICD-10-CM

## 2018-10-18 DIAGNOSIS — F411 Generalized anxiety disorder: Secondary | ICD-10-CM

## 2018-10-18 DIAGNOSIS — G2581 Restless legs syndrome: Secondary | ICD-10-CM

## 2018-10-18 DIAGNOSIS — F319 Bipolar disorder, unspecified: Secondary | ICD-10-CM | POA: Diagnosis not present

## 2018-10-18 LAB — FERRITIN: FERRITIN: 54.5 ng/mL (ref 10.0–291.0)

## 2018-10-18 MED ORDER — CYCLOBENZAPRINE HCL 10 MG PO TABS: 10.0000 mg | ORAL_TABLET | Freq: Every day | ORAL | 3 refills | Status: DC

## 2018-10-18 NOTE — Patient Instructions (Addendum)
1.  At this point, I want to submit for pre-authorization for Horizant.  Once we get started on that, we will start tapering down Requip 2.  In meantime, start cyclobenzaprine 10mg  1 to 2 hours prior to bedtime. 3.  Check ferritin level  Your provider requests that you have LABS drawn today.  We share a lab with [facility] - they are located in [address] (second floor) of this building.  Once you get there, please have a seat and the phlebotomist will call your name.  If you have waited more than 15 minutes, please advise the front desk  4.  Follow up in 3 months.

## 2018-10-18 NOTE — Progress Notes (Signed)
NEUROLOGY CONSULTATION NOTE  Beth [name] MRN: [id number] DOB: 04/27/1976  Referring provider: Thayer Headings, PMHNP Primary care provider: Glendale Chard, MD  Reason for consult: Restless leg syndrome  HISTORY OF PRESENT ILLNESS: Beth [name] is a 42 year oldfemale with Bipolar disorder, PTSD, generalized anxiety disorder and primary insomnia who presents for restless leg syndrome.  History supplemented by referring providers note.  She started experiencing restless leg symptoms 13 years ago when she started bipolar medications.  Every night in bed, she notes painful needles sensation in the legs that causes her to move her legs around.  However, moving the legs or walking around may only mildly help alleviate it .  Comorbidities include bipolar disorder with depression, generalized anxiety disorder, and primary insomnia.  Current medication:  Ropinirole 8mg  at bedtime, Depakote ER 500mg  at bedtime, lamotrigine 300mg  daily, doxazosin 2mg  at bedtime, eszopiclone 3mg  at bedtime, clonazepam 0.5mg  four times daily, sertraline 100mg  daily, gabapentin 800mg /800mg /1600mg , iron supplement  Past medications:  Lyrica (weight gain)  09/02/18 LABS:  Hepatic panel with t bili 0.6, ALP 63, AST 19, ALT 13; iron 69, TIBC 361; B12 343.  Father had Parkinson's disease.  PAST MEDICAL HISTORY: Past Medical History:  Diagnosis Date  . Bipolar disorder ([place])   . HSV (herpes simplex virus) anogenital infection 05/2018  . RLS (restless legs syndrome)     PAST SURGICAL HISTORY: Past Surgical History:  Procedure Laterality Date  . BARTHOLIN CYST MARSUPIALIZATION    . IVF      MEDICATIONS: Current Outpatient Medications on File Prior to Visit  Medication Sig Dispense Refill  . asenapine (SAPHRIS) 5 MG SUBL 24 hr tablet Place 1 tablet (5 mg total) under the tongue at bedtime. 30 tablet 1  . clonazePAM (KLONOPIN) 0.5 MG tablet 1 tab four times daily 120 tablet 0  . divalproex (DEPAKOTE ER) 250  MG 24 hr tablet Take 3 tablets (750 mg total) by mouth at bedtime. 90 tablet 0  . doxazosin (CARDURA) 2 MG tablet Take 1/2 tab po qhs x 3 nights, then increase to 1 tab po qhs as tolerated 30 tablet 1  . Eszopiclone (ESZOPICLONE) 3 MG TABS Take 1 tablet (3 mg total) by mouth at bedtime. Take immediately before bedtime 30 tablet 1  . gabapentin (NEURONTIN) 400 MG capsule Take 800 mg by mouth daily. 1 po BID and 2 po QHS    . lamoTRIgine (LAMICTAL) 200 MG tablet Take 1.5 tablets (300 mg total) by mouth daily. 30 tablet 2  . lidocaine (XYLOCAINE) 5 % ointment Apply 1 application topically as needed. (Patient not taking: Reported on 09/17/2018) 35.44 g 0  . LORazepam (ATIVAN) 1 MG tablet Take 1 mg by mouth 4 (four) times daily.    [name] loxapine (LOXITANE) 10 MG capsule Take 10 mg by mouth at bedtime.    [name] rOPINIRole (REQUIP) 4 MG tablet Take 2 tablets (8 mg total) by mouth at bedtime. 60 tablet 2  . sertraline (ZOLOFT) 100 MG tablet Take 1 tablet (100 mg total) by mouth daily. 30 tablet 2  . valACYclovir (VALTREX) 500 MG tablet Take one tablet by mouth twice daily for 3-5 days then daily as needed. 90 tablet 4   No current facility-administered medications on file prior to visit.     ALLERGIES: No Known Allergies  FAMILY HISTORY: Family History  Problem Relation Age of Onset  . Thyroid disease Mother   . COPD Mother   . Bipolar disorder Mother   . Parkinson's disease Father   .  Breast cancer Paternal Grandmother 43  . Bipolar disorder Sister   . Schizophrenia Other    SOCIAL HISTORY: Social History   Socioeconomic History  . Marital status: Married    Spouse name: Not on file  . Number of children: Not on file  . Years of education: Not on file  . Highest education level: Not on file  Occupational History  . Not on file  Social Needs  . Financial resource strain: Not on file  . Food insecurity:    Worry: Not on file    Inability: Not on file  . Transportation needs:    Medical:  Not on file    Non-medical: Not on file  Tobacco Use  . Smoking status: Former Research scientist (life sciences)  . Smokeless tobacco: Never Used  Substance and Sexual Activity  . Alcohol use: No  . Drug use: No  . Sexual activity: Yes    Birth control/protection: None    Comment: 1st intercourse 18 yo-5 partners  Lifestyle  . Physical activity:    Days per week: Not on file    Minutes per session: Not on file  . Stress: Not on file  Relationships  . Social connections:    Talks on phone: Not on file    Gets together: Not on file    Attends religious service: Not on file    Active member of club or organization: Not on file    Attends meetings of clubs or organizations: Not on file    Relationship status: Not on file  . Intimate partner violence:    Fear of current or ex partner: Not on file    Emotionally abused: Not on file    Physically abused: Not on file    Forced sexual activity: Not on file  Other Topics Concern  . Not on file  Social History Narrative  . Not on file    REVIEW OF SYSTEMS: Constitutional: No fevers, chills, or sweats, no generalized fatigue, change in appetite Eyes: No visual changes, double vision, eye pain Ear, nose and throat: No hearing loss, ear pain, nasal congestion, sore throat Cardiovascular: No chest pain, palpitations Respiratory:  No shortness of breath at rest or with exertion, wheezes GastrointestinaI: No nausea, vomiting, diarrhea, abdominal pain, fecal incontinence Genitourinary:  No dysuria, urinary retention or frequency Musculoskeletal:  No neck pain, back pain Integumentary: No rash, pruritus, skin lesions Neurological: as above Psychiatric: No depression, insomnia, anxiety Endocrine: No palpitations, fatigue, diaphoresis, mood swings, change in appetite, change in weight, increased thirst Hematologic/Lymphatic:  No purpura, petechiae. Allergic/Immunologic: no itchy/runny eyes, nasal congestion, recent allergic reactions, rashes  PHYSICAL  EXAM: Blood pressure 108/76, pulse 80, height 5\' 7"  (1.702 m), weight 166 lb (75.3 kg), SpO2 96 %. General: No acute distress.  Patient appears well-groomed.   Head:  Normocephalic/atraumatic Eyes:  fundi examined but not visualized Neck: supple, no paraspinal tenderness, full range of motion Back: No paraspinal tenderness Heart: regular rate and rhythm Lungs: Clear to auscultation bilaterally. Vascular: No carotid bruits. Neurological Exam: Mental status: alert and oriented to person, place, and time, recent and remote memory intact, fund of knowledge intact, attention and concentration intact, speech fluent and not dysarthric, language intact. Cranial nerves: CN I: not tested CN II: pupils equal, round and reactive to light, visual fields intact CN III, IV, VI:  full range of motion, no nystagmus, no ptosis CN V: facial sensation intact CN VII: upper and lower face symmetric CN VIII: hearing intact CN IX, X: gag intact,  uvula midline CN XI: sternocleidomastoid and trapezius muscles intact CN XII: tongue midline Bulk & Tone: normal, no fasciculations. Motor:  5/5 throughout  Sensation:  temperature and vibration sensation intact.  Deep Tendon Reflexes:  2+ throughout, toes downgoing.   Finger to nose testing:  Without dysmetria.   Heel to shin:  Without dysmetria.   Gait:  Normal station and stride.  Romberg negative  IMPRESSION: Restless leg syndrome.  She has likely reached augmentation with Requip.  PLAN: 1.  I will submit for pre-authorization for Horizant.  She has failed multiple medications including Requip, Klonopin, gabapentin, Lyrica and iron supplementation.  Once we start Horizant, we can start tapering down Requip with goal of then switching over to Mirapex. 2.  Will check ferritin level to determine if she needs to increase iron supplementation (level should be greater than 75). 3.  Follow up in 3 months.  Thank you for allowing me to take part in the care of this  patient.  Metta Clines, DO  CC:  Thayer Headings  Glendale Chard

## 2018-10-19 ENCOUNTER — Other Ambulatory Visit: Payer: Self-pay | Admitting: Neurology

## 2018-10-19 MED ORDER — GABAPENTIN ENACARBIL ER 600 MG PO TBCR: 600.0000 mg | EXTENDED_RELEASE_TABLET | Freq: Every day | ORAL | 5 refills | Status: DC

## 2018-10-21 ENCOUNTER — Ambulatory Visit: Payer: Medicare PPO | Admitting: Psychiatry

## 2018-10-22 ENCOUNTER — Telehealth: Payer: Self-pay | Admitting: Neurology

## 2018-10-22 NOTE — Telephone Encounter (Signed)
Called and advised Pt I rcvd fax from pharmacy yesterday, and I am working on [place]

## 2018-10-22 NOTE — Telephone Encounter (Signed)
Patient states medication horizant is too expensive and states if a prior Josem Kaufmann is done she might can get it cheaper. Patient wanting to know what she needs to do to get medication.

## 2018-10-25 ENCOUNTER — Other Ambulatory Visit: Payer: Self-pay | Admitting: Psychiatry

## 2018-10-25 ENCOUNTER — Encounter: Payer: Self-pay | Admitting: Psychiatry

## 2018-10-25 ENCOUNTER — Ambulatory Visit (INDEPENDENT_AMBULATORY_CARE_PROVIDER_SITE_OTHER): Payer: Medicare PPO | Admitting: Psychiatry

## 2018-10-25 VITALS — BP 114/73 | HR 63 | Resp 16

## 2018-10-25 DIAGNOSIS — G2581 Restless legs syndrome: Secondary | ICD-10-CM | POA: Diagnosis not present

## 2018-10-25 DIAGNOSIS — F5105 Insomnia due to other mental disorder: Secondary | ICD-10-CM

## 2018-10-25 DIAGNOSIS — F4310 Post-traumatic stress disorder, unspecified: Secondary | ICD-10-CM

## 2018-10-25 DIAGNOSIS — F315 Bipolar disorder, current episode depressed, severe, with psychotic features: Secondary | ICD-10-CM

## 2018-10-25 DIAGNOSIS — F411 Generalized anxiety disorder: Secondary | ICD-10-CM | POA: Diagnosis not present

## 2018-10-25 DIAGNOSIS — F99 Mental disorder, not otherwise specified: Secondary | ICD-10-CM

## 2018-10-25 MED ORDER — DIVALPROEX SODIUM ER 500 MG PO TB24: 1000.0000 mg | ORAL_TABLET | Freq: Every day | ORAL | 1 refills | Status: DC

## 2018-10-25 MED ORDER — LORAZEPAM 1 MG PO TABS: 1.0000 mg | ORAL_TABLET | Freq: Four times a day (QID) | ORAL | 1 refills | Status: DC

## 2018-10-25 NOTE — Progress Notes (Signed)
[name] [id number] 1976/03/03 42 y.o.  Subjective:   Patient ID:  Beth [name] is a 42 y.o. (DOB [birth year]) female.  Chief Complaint:  Chief Complaint  Patient presents with  . Medication Problem    multiple intolerances/changes  . Hallucinations  . Sleeping Problem    RLS    HPI Beth [name] presents to the office today for follow-up of unstable mood disorder with hallucinations and RLS.  Multiple med failures and seeing Cottle MD for better control of sx and to explore other options.  Doxazosin helped NM.  Had NM of F every night and would interfere.  Tired of dreaming of him.  They stopped..   Saphris helped her go to sleep.  Before it couldn't get to sleep.  Now average 5 hours sleep.  No naps.   Anxiety all the time generalized about everything always been like that.  Last panic about 2 days ago.  3 times weekly.  Usually triggered by situations like H who might trigger her anger.  Has a lot of anger problems, cycle into that and then into anxiety.  Worry about money, disability review.  Did disability review a couple of months ago.   May avoid going out places DT anxiety and no motivation.  No initiative, enthusiasm.  Also sadness most days.  May cry alone.  M sees it.  Doesn't remember last time depression was under control for at least 8 weeks, not in years.  Blames chronic stressors.  Current stressors $, fear of losing disability.  Hallucinations-hears her F and then a girl.  Negative things that she's not good enough, the world would be better without her.  Not suicidal.  Voices usually once daily, no particular time of day.  Shuts down when hears the voices.  Had good realtionship with father who died 3 years ago.  Remote SI but did not hurt herself, was hospitalized.  Prior psychiatric medication trials include Latuda which is because of restless legs, Fanapt which cause restless legs, Saphris which caused weight gain, olanzapine weight gain, Geodon, Seroquel  weight gain and restless legs, risperidone restless legs, Lyrica weight gain, lithium side effects, lamotrigine, he Equetro with mildly elevated liver enzymes, Lexapro, duloxetine, venlafaxine, bupropion, fluoxetine, doxepin ineffective, Restoril lost effect, sertraline, Ambien, mirtazapine increased appetite, trazodone restlessness.  She has not been on pramipexole to her knowledge  Review of Systems:  Review of Systems  Constitutional: Negative.   HENT: Negative.   Eyes: Negative.   Respiratory: Negative.   Cardiovascular: Negative.   Gastrointestinal: Negative.   Endocrine: Negative.   Genitourinary: Negative.   Musculoskeletal: Negative.   Allergic/Immunologic: Negative.   Neurological: Negative.  Negative for tremors and weakness.  Hematological: Negative.   Psychiatric/Behavioral: Positive for decreased concentration, dysphoric mood and hallucinations. Negative for agitation, behavioral problems, confusion, self-injury, sleep disturbance and suicidal ideas. The patient is nervous/anxious. The patient is not hyperactive.     Medications: I have reviewed the patient's current medications.  Current Outpatient Medications  Medication Sig Dispense Refill  . asenapine (SAPHRIS) 5 MG SUBL 24 hr tablet Place 1 tablet (5 mg total) under the tongue at bedtime. 30 tablet 1  . cyclobenzaprine (FLEXERIL) 10 MG tablet Take 1 tablet (10 mg total) by mouth at bedtime. 30 tablet 3  . divalproex (DEPAKOTE ER) 500 MG 24 hr tablet Take 2 tablets (1,000 mg total) by mouth at bedtime. 60 tablet 1  . doxazosin (CARDURA) 2 MG tablet Take 1/2 tab po qhs x 3 nights, then increase  to 1 tab po qhs as tolerated 30 tablet 1  . Eszopiclone (ESZOPICLONE) 3 MG TABS Take 1 tablet (3 mg total) by mouth at bedtime. Take immediately before bedtime 30 tablet 1  . gabapentin (NEURONTIN) 400 MG capsule Take 800 mg by mouth daily. 1 po BID and 2 po QHS    . lamoTRIgine (LAMICTAL) 200 MG tablet Take 1.5 tablets (300 mg  total) by mouth daily. 30 tablet 2  . Lurasidone HCl (LATUDA) 60 MG TABS Take 60 mg by mouth daily.    [name] rOPINIRole (REQUIP) 4 MG tablet Take 2 tablets (8 mg total) by mouth at bedtime. 60 tablet 2  . sertraline (ZOLOFT) 100 MG tablet Take 1 tablet (100 mg total) by mouth daily. 30 tablet 2  . valACYclovir (VALTREX) 500 MG tablet Take one tablet by mouth twice daily for 3-5 days then daily as needed. 90 tablet 4  . Gabapentin Enacarbil (HORIZANT) 600 MG TBCR Take 1 tablet (600 mg total) by mouth daily. Take with food around 5 PM (Patient not taking: Reported on 10/25/2018) 30 tablet 5  . LORazepam (ATIVAN) 1 MG tablet Take 1 tablet (1 mg total) by mouth 4 (four) times daily. 1201 tablet 1   No current facility-administered medications for this visit.     Medication Side Effects: Other: RLS  Allergies: No Known Allergies  Past Medical History:  Diagnosis Date  . Bipolar disorder ([place])   . HSV (herpes simplex virus) anogenital infection 05/2018  . RLS (restless legs syndrome)     Family History  Problem Relation Age of Onset  . Thyroid disease Mother   . COPD Mother   . Bipolar disorder Mother   . Parkinson's disease Father   . Breast cancer Paternal Grandmother 8  . Bipolar disorder Sister   . Schizophrenia Other     Social History   Socioeconomic History  . Marital status: Married    Spouse name: Not on file  . Number of children: Not on file  . Years of education: Not on file  . Highest education level: Not on file  Occupational History  . Not on file  Social Needs  . Financial resource strain: Not on file  . Food insecurity:    Worry: Not on file    Inability: Not on file  . Transportation needs:    Medical: Not on file    Non-medical: Not on file  Tobacco Use  . Smoking status: Former Research scientist (life sciences)  . Smokeless tobacco: Never Used  Substance and Sexual Activity  . Alcohol use: No  . Drug use: No  . Sexual activity: Yes    Birth control/protection: None     Comment: 1st intercourse 65 yo-5 partners  Lifestyle  . Physical activity:    Days per week: Not on file    Minutes per session: Not on file  . Stress: Not on file  Relationships  . Social connections:    Talks on phone: Not on file    Gets together: Not on file    Attends religious service: Not on file    Active member of club or organization: Not on file    Attends meetings of clubs or organizations: Not on file    Relationship status: Not on file  . Intimate partner violence:    Fear of current or ex partner: Not on file    Emotionally abused: Not on file    Physically abused: Not on file    Forced sexual activity: Not on  file  Other Topics Concern  . 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     Past Medical History, Surgical history, Social history, and Family history were reviewed and updated as appropriate.   Seeing neurologist Dr. [name] for RLS.  On disability for psych reasons for 10 years.  Not seeing therapist in the last several months.  Stopped for $ reasons.  I need to work on that.  Please see review of systems for further details on the patient's review from today.   Objective:   Physical Exam:  There were no vitals taken for this visit.  Physical Exam  Constitutional: She appears well-nourished. She is cooperative.  Neurological: She is alert. She displays no tremor. Coordination and gait normal.  Psychiatric: Her speech is normal and behavior is normal. Judgment and thought content normal. Her mood appears anxious. Her affect is angry. She is actively hallucinating. Thought content is not paranoid. Cognition and memory are normal. She exhibits a depressed mood. She expresses no homicidal and no suicidal ideation.  Insight and judgment fair. Voices critical. She is attentive.    Lab Review:      Component Value Date/Time   NA 137 07/12/2018 0859   K 4.3 07/12/2018 0859   CL 103 07/12/2018 0859   CO2 27 07/12/2018 0859   GLUCOSE 94 07/12/2018 0859   BUN 11 07/12/2018 0859   CREATININE 0.69 07/12/2018 0859   CALCIUM 9.4 07/12/2018 0859   PROT 7.5 09/02/2018 1058   ALBUMIN 4.7 03/20/2017 1100   AST 19 09/02/2018 1058   ALT 13 09/02/2018 1058   ALKPHOS 65 03/20/2017 1100   BILITOT 0.6 09/02/2018 1058       Component Value Date/Time   WBC 6.3 07/12/2018 0859   RBC 4.39 07/12/2018 0859   HGB 14.0 07/12/2018 0859   HCT 40.4 07/12/2018 0859   PLT 336 07/12/2018 0859   MCV 92.0 07/12/2018 0859   MCH 31.9 07/12/2018 0859   MCHC 34.7 07/12/2018 0859   RDW 12.8 07/12/2018 0859   LYMPHSABS 1,884 07/12/2018 0859   MONOABS 570 03/20/2017 1100   EOSABS 239 07/12/2018 0859   BASOSABS 88 07/12/2018 0859    No results found for: POCLITH, LITHIUM   No results found for: PHENYTOIN, PHENOBARB, VALPROATE, CBMZ   .res Assessment: Plan:    Bipolar disorder, current episode depressed, severe, with psychotic features ([place]) - Plan: divalproex (DEPAKOTE ER) 500 MG 24 hr tablet  Generalized anxiety disorder  Insomnia due to other mental disorder  Restless legs syndrome  Please see After Visit Summary for patient specific instructions. Greater than 50% of face to face time with patient was spent on counseling and coordination of care. We discussed difficulty in finding medication that will stabilize her mood and help with the auditory hallucinations which will not also worsen the restless legs.  Extensive history of psychiatric meds tried discussed with the patient.  We discussed in detail the interaction between antipsychotics and restless legs medicines in regard to dopamine.  She has severe chronic mental illness and it is uncertain how much wellness she can achieve but it is certainly reasonable to try some more options. We will continue the doxazosin because it has resolved the  nightmares. Clozapine option.  Discussed this in detail including the risk of aplastic  anemia. Option retry CBZ and follow liver enzymes as they were only mildly elevated. Not adequate response for depression from [place], wean by dropping dosage to 30mg  and then maybe she can tolerate more Saphris for voices and as mood stabilizer.  Will defer increase in Saphris to 10 or higher until she tries Horizant and the higher dosage of Depakote. It is quite possible that the combination of Latuda and Saphris are aggravating the restless legs because of the combination which may be less of a problem if she is only on 1 of the 2 alternatives.  She feels the Saphris has helped her with her sleep. Alternative pramipexole to ropinirole to help RLS and depression.  Defer until she's had trial of Horizant per neuro. Increase Depakote to Er 1000 HS a more typical dosage and maybe higher. Hold off loxapine for now, but it remains an alternative to Saphris.  Discussed potential metabolic side effects associated with atypical antipsychotics, as well as potential risk for movement side effects. Advised pt to contact office if movement side effects occur.   Likes Ativan better than clonazepam, but relaxed. Therefore, DC clonazepam and return to lorazepam. We discussed the short-term risks associated with benzodiazepines including sedation and increased fall risk among others.  Discussed long-term side effect risk including dependence, potential withdrawal symptoms, and the potential eventual dose-related risk of dementia. We discussed side effects of each of the meds that she is on.   She agrees to this plan  FU 3-4 weeks.  Today's appt 50 mins  Rec restart psychotherapy.  Lynder Parents, MD, DFAPA   Future Appointments  Date Time Provider [place]  11/26/2018  1:30 PM Cottle, Billey Co., MD [facility] None  02/09/2019  9:50 AM Pieter Partridge, DO [facility] None    No orders of the defined types were  placed in this encounter.     -------------------------------

## 2018-10-25 NOTE — Patient Instructions (Signed)
Cut Latuda to 1/2 of 30 mg daily with meal.  Increase Depakote ER to 1000mg  daily at night.

## 2018-10-27 ENCOUNTER — Telehealth: Payer: Self-pay

## 2018-10-27 NOTE — Telephone Encounter (Signed)
Called Pt, LMOVM for return call

## 2018-10-27 NOTE — Telephone Encounter (Signed)
-----   Message from Pieter Partridge, DO sent at 10/20/2018 11:38 AM EST ----- Ferritin level is okay.  It is 54.5.  For restless leg, it is recommended that the level be over 50.  If the restless leg is severe, we aim for over 75.  If she is already taking ferrous sulfate 325mg  daily, we consider IV iron supplementation.  However, insurance typically won't cover it and I doubt it will make much difference.

## 2018-10-28 ENCOUNTER — Telehealth: Payer: Self-pay

## 2018-10-28 NOTE — Telephone Encounter (Signed)
Called and talked with Pt, advised her of results

## 2018-10-28 NOTE — Telephone Encounter (Signed)
-----   Message from Pieter Partridge, DO sent at 10/20/2018 11:38 AM EST ----- Ferritin level is okay.  It is 54.5.  For restless leg, it is recommended that the level be over 50.  If the restless leg is severe, we aim for over 75.  If she is already taking ferrous sulfate 325mg  daily, we consider IV iron supplementation.  However, insurance typically won't cover it and I doubt it will make much difference.

## 2018-11-08 ENCOUNTER — Other Ambulatory Visit: Payer: Self-pay | Admitting: Psychiatry

## 2018-11-08 ENCOUNTER — Telehealth: Payer: Self-pay | Admitting: Neurology

## 2018-11-08 ENCOUNTER — Telehealth: Payer: Self-pay | Admitting: Psychiatry

## 2018-11-08 MED ORDER — ZOLPIDEM TARTRATE 10 MG PO TABS: 5.0000 mg | ORAL_TABLET | Freq: Every evening | ORAL | 0 refills | Status: DC | PRN

## 2018-11-08 MED ORDER — PRAMIPEXOLE DIHYDROCHLORIDE 1 MG PO TABS: 2.0000 mg | ORAL_TABLET | ORAL | 3 refills | Status: DC

## 2018-11-08 NOTE — Telephone Encounter (Signed)
Called and spoke with Pt. 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 She wants to start Mirapex

## 2018-11-08 NOTE — Telephone Encounter (Signed)
Discuss with provider

## 2018-11-08 NOTE — Telephone Encounter (Signed)
Pt called and said that her lunesta that was prescribed for sleep is not working. The pt would like to back on [place].

## 2018-11-08 NOTE — Telephone Encounter (Addendum)
Called and advised Pt 

## 2018-11-08 NOTE — Progress Notes (Signed)
OK retry zolpidem.  Will only give a 15 day supply bc it did not work last time she took it.

## 2018-11-08 NOTE — Telephone Encounter (Signed)
Patient left vm about medication not working. Please call her back at [phone]. Thanks!

## 2018-11-08 NOTE — Telephone Encounter (Signed)
Stop Requip.  Start Mirapex 2mg  two to three hours before bedtime.

## 2018-11-09 NOTE — Telephone Encounter (Signed)
She's aware. 

## 2018-11-16 ENCOUNTER — Encounter

## 2018-11-16 ENCOUNTER — Ambulatory Visit: Payer: Commercial Managed Care - HMO | Admitting: Neurology

## 2018-11-26 ENCOUNTER — Encounter: Payer: Self-pay | Admitting: Psychiatry

## 2018-11-26 ENCOUNTER — Ambulatory Visit (INDEPENDENT_AMBULATORY_CARE_PROVIDER_SITE_OTHER): Payer: Medicare PPO | Admitting: Psychiatry

## 2018-11-26 DIAGNOSIS — F5101 Primary insomnia: Secondary | ICD-10-CM

## 2018-11-26 DIAGNOSIS — F315 Bipolar disorder, current episode depressed, severe, with psychotic features: Secondary | ICD-10-CM | POA: Diagnosis not present

## 2018-11-26 DIAGNOSIS — F5105 Insomnia due to other mental disorder: Secondary | ICD-10-CM | POA: Diagnosis not present

## 2018-11-26 DIAGNOSIS — G2581 Restless legs syndrome: Secondary | ICD-10-CM | POA: Diagnosis not present

## 2018-11-26 DIAGNOSIS — F411 Generalized anxiety disorder: Secondary | ICD-10-CM

## 2018-11-26 DIAGNOSIS — F4310 Post-traumatic stress disorder, unspecified: Secondary | ICD-10-CM

## 2018-11-26 DIAGNOSIS — F99 Mental disorder, not otherwise specified: Secondary | ICD-10-CM

## 2018-11-26 MED ORDER — LAMOTRIGINE 200 MG PO TABS: 300.0000 mg | ORAL_TABLET | Freq: Every day | ORAL | 0 refills | Status: DC

## 2018-11-26 MED ORDER — LORAZEPAM 1 MG PO TABS: 1.0000 mg | ORAL_TABLET | Freq: Four times a day (QID) | ORAL | 1 refills | Status: DC

## 2018-11-26 MED ORDER — LOXAPINE SUCCINATE 10 MG PO CAPS: ORAL_CAPSULE | ORAL | 1 refills | Status: DC

## 2018-11-26 MED ORDER — DOXAZOSIN MESYLATE 2 MG PO TABS: 2.0000 mg | ORAL_TABLET | Freq: Every day | ORAL | 2 refills | Status: DC

## 2018-11-26 MED ORDER — ZOLPIDEM TARTRATE 10 MG PO TABS: 5.0000 mg | ORAL_TABLET | Freq: Every evening | ORAL | 1 refills | Status: DC | PRN

## 2018-11-26 MED ORDER — GABAPENTIN 400 MG PO CAPS: ORAL_CAPSULE | ORAL | 1 refills | Status: DC

## 2018-11-26 MED ORDER — SERTRALINE HCL 100 MG PO TABS: 100.0000 mg | ORAL_TABLET | Freq: Every day | ORAL | 0 refills | Status: DC

## 2018-11-26 NOTE — Patient Instructions (Addendum)
Stop the Latuda  Stop the Saphris  Start loxapine 10 mg twice a day for 1 week and then 1 in the morning and 2 at night  Call if that is not helping you sleep or if your sleep gets worse.  Bring meds to the next appointment

## 2018-11-26 NOTE — Progress Notes (Signed)
Beth [name] [id number] 1976-03-15 42 y.o.  Subjective:   Patient ID:  Beth [name] is a 42 y.o. (DOB 08/30/1976) female.  Chief Complaint:  Chief Complaint  Patient presents with  . Depression  . Anxiety  . Sleeping Problem    HPI Beth [name] presents to the office today for follow-up of bipolar unstable and sleep problems. At last visit latuda was cut in 1/2 without problems and plan to wean DT NR.  Increased Depakote.  Neuro changed her from Ropinirole to Mirapex but she doesn't know the dosage.  Lunesta NR.  Sleep better with Ambien.  No naps.  Doxazosin helped NM.  Had NM of F every night and would interfere.  Tired of dreaming of him.  They stopped..   Saphris helped her go to sleep.  Before it couldn't get to sleep.  Now average 5 hours sleep.  No naps.   Anxiety all the time generalized about everything always been like that.  Last panic about 2 days ago.  3 times weekly.  Usually triggered by situations like H who might trigger her anger.  Has a lot of anger problems, cycle into that and then into anxiety.  Worry about money, disability review.  Did disability review a couple of months ago.   May avoid going out places DT anxiety and no motivation.  No initiative, enthusiasm.  Also sadness most days.  May cry alone.  M sees it.  Doesn't remember last time depression was under control for at least 8 weeks, not in years.  Blames chronic stressors. Depressed and anxious continue.   Insurance change demand she stop Saphris.  Current stressors $, fear of losing disability.  Hallucinations-hears her F and then a girl.  Negative things that she's not good enough, the world would be better without her.  Not suicidal.  Voices usually once daily, no particular time of day.  Shuts down when hears the voices.  Had good realtionship with father who died 3 years ago.  Remote SI but did not hurt herself, was hospitalized.  Prior psychiatric medication trials include Latuda which is  because of restless legs, Fanapt which cause restless legs, Saphris which caused weight gain, olanzapine weight gain, Geodon, Seroquel weight gain and restless legs, risperidone restless legs, Lyrica weight gain, lithium side effects, lamotrigine, he Equetro with mildly elevated liver enzymes, Lexapro, duloxetine, venlafaxine, bupropion, fluoxetine, doxepin ineffective, Restoril lost effect, sertraline, Ambien, mirtazapine increased appetite, trazodone restlessness.   Review of Systems:  Review of Systems  Neurological: Negative for tremors, weakness and headaches.       RLS controlled   Psychiatric/Behavioral: Positive for decreased concentration, dysphoric mood, hallucinations and sleep disturbance. Negative for agitation, behavioral problems, confusion, self-injury and suicidal ideas. The patient is nervous/anxious. The patient is not hyperactive.     Medications: I have reviewed the patient's current medications.  Current Outpatient Medications  Medication Sig Dispense Refill  . cyclobenzaprine (FLEXERIL) 10 MG tablet Take 1 tablet (10 mg total) by mouth at bedtime. 30 tablet 3  . divalproex (DEPAKOTE ER) 500 MG 24 hr tablet TAKE 2 TABLETS(1000 MG) BY MOUTH AT BEDTIME 180 tablet 1  . doxazosin (CARDURA) 2 MG tablet Take 1/2 tab po qhs x 3 nights, then increase to 1 tab po qhs as tolerated (Patient taking differently: Take 2 mg by mouth at bedtime. ) 30 tablet 1  . gabapentin (NEURONTIN) 400 MG capsule Take 800 mg by mouth daily. 1 po BID and 2 po QHS    .  lamoTRIgine (LAMICTAL) 200 MG tablet Take 1.5 tablets (300 mg total) by mouth daily. 30 tablet 2  . LORazepam (ATIVAN) 1 MG tablet Take 1 tablet (1 mg total) by mouth 4 (four) times daily. 1201 tablet 1  . Lurasidone HCl (LATUDA) 60 MG TABS Take 30 mg by mouth daily.     . pramipexole (MIRAPEX) 1 MG tablet Take 2 tablets (2 mg total) by mouth as directed. Take 2-3 hours before bedtime 60 tablet 3  . sertraline (ZOLOFT) 100 MG tablet Take 1  tablet (100 mg total) by mouth daily. 30 tablet 2  . valACYclovir (VALTREX) 500 MG tablet Take one tablet by mouth twice daily for 3-5 days then daily as needed. 90 tablet 4  . zolpidem (AMBIEN) 10 MG tablet Take 0.5-1 tablets (5-10 mg total) by mouth at bedtime as needed for sleep. 15 tablet 0  . asenapine (SAPHRIS) 5 MG SUBL 24 hr tablet Place 1 tablet (5 mg total) under the tongue at bedtime. 30 tablet 1  . Gabapentin Enacarbil (HORIZANT) 600 MG TBCR Take 1 tablet (600 mg total) by mouth daily. Take with food around 5 PM (Patient not taking: Reported on 10/25/2018) 30 tablet 5   No current facility-administered medications for this visit.     Medication Side Effects: Other: RLS  Allergies: No Known Allergies  Past Medical History:  Diagnosis [name]  . Bipolar disorder ([place])   . HSV (herpes simplex virus) anogenital infection 05/2018  . RLS (restless legs syndrome)     Family History  Problem Relation Age of Onset  . Thyroid disease Mother   . COPD Mother   . Bipolar disorder Mother   . Parkinson's disease Father   . Breast cancer Paternal Grandmother 69  . Bipolar disorder Sister   . Schizophrenia Other     Social History   Socioeconomic History  . Marital status: Married    Spouse name: Not on file  . Number of children: Not on file  . Years of education: Not on file  . Highest education level: Not on file  Occupational History  . Not on file  Social Needs  . Financial resource strain: Not on file  . Food insecurity:    Worry: Not on file    Inability: Not on file  . Transportation needs:    Medical: Not on file    Non-medical: Not on file  Tobacco Use  . Smoking status: Former Research scientist (life sciences)  . Smokeless tobacco: Never Used  Substance and Sexual Activity  . Alcohol use: No  . Drug use: No  . Sexual activity: Yes    Birth control/protection: None    Comment: 1st intercourse 60 yo-5 partners  Lifestyle  . Physical activity:    Days per week: Not on file     Minutes per session: Not on file  . Stress: Not on file  Relationships  . Social connections:    Talks on phone: Not on file    Gets together: Not on file    Attends religious service: Not on file    Active member of club or organization: Not on file    Attends meetings of clubs or organizations: Not on file    Relationship status: Not on file  . Intimate partner violence:    Fear of current or ex partner: Not on file    Emotionally abused: Not on file    Physically abused: Not on file    Forced sexual activity: Not on file  Other Topics  Concern  . 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     Past Medical History, Surgical history, Social history, and Family history were reviewed and updated as appropriate.   Please see review of systems for further details on the patient's review from today.   Objective:   Physical Exam:  There were no vitals taken for this visit.  Physical Exam Constitutional:      Appearance: Normal appearance.  Neurological:     Mental Status: She is alert.     Motor: No tremor.     Gait: Gait normal.  Psychiatric:        Attention and Perception: She is attentive. She perceives auditory hallucinations.        Mood and Affect: Mood is anxious and depressed.        Speech: Speech normal.        Behavior: Behavior normal. Behavior is cooperative.        Thought Content: Thought content is paranoid. Thought content does not include homicidal or suicidal ideation.        Cognition and Memory: Cognition normal.     Comments: Insight and judgment fair. Paranoid a long time that someone is out to get her.     Lab Review:     Component Value [name]/Time   NA 137 07/12/2018 0859   K 4.3 07/12/2018 0859   CL 103 07/12/2018 0859   CO2 27 07/12/2018 0859   GLUCOSE 94 07/12/2018 0859   BUN 11  07/12/2018 0859   CREATININE 0.69 07/12/2018 0859   CALCIUM 9.4 07/12/2018 0859   PROT 7.5 09/02/2018 1058   ALBUMIN 4.7 03/20/2017 1100   AST 19 09/02/2018 1058   ALT 13 09/02/2018 1058   ALKPHOS 65 03/20/2017 1100   BILITOT 0.6 09/02/2018 1058       Component Value [name]/Time   WBC 6.3 07/12/2018 0859   RBC 4.39 07/12/2018 0859   HGB 14.0 07/12/2018 0859   HCT 40.4 07/12/2018 0859   PLT 336 07/12/2018 0859   MCV 92.0 07/12/2018 0859   MCH 31.9 07/12/2018 0859   MCHC 34.7 07/12/2018 0859   RDW 12.8 07/12/2018 0859   LYMPHSABS 1,884 07/12/2018 0859   MONOABS 570 03/20/2017 1100   EOSABS 239 07/12/2018 0859   BASOSABS 88 07/12/2018 0859    No results found for: POCLITH, LITHIUM   No results found for: PHENYTOIN, PHENOBARB, VALPROATE, CBMZ   .res Assessment: Plan:    Bipolar disorder, current episode depressed, severe, with psychotic features ([place])  Generalized anxiety disorder  Insomnia due to other mental disorder  Restless legs syndrome  PTSD (post-traumatic stress disorder)   Brexley has treatment resistant bipolar disorder with psychotic features versus schizoaffective disorder and other psychiatric diagnoses as well.  She has been very difficult to treat in part because she is very sensitive to antipsychotics and yet has chronic auditory hallucinations.  We discussed difficulty in finding medication that will stabilize her mood and help with the auditory hallucinations which will not also worsen the restless legs.  Extensive history of psychiatric meds tried discussed with the patient.  We discussed in detail the interaction between antipsychotics and restless legs medicines in regard to dopamine.  She has severe chronic mental illness and it is uncertain how much wellness she can  achieve but it is certainly reasonable to try some more options. We will continue the doxazosin because it has resolved the nightmares. Clozapine option.  Discussed this in detail including  the risk of aplastic anemia. Option retry CBZ and follow liver enzymes as they were only mildly elevated.  Option Abilify but wouldn't help sleep.    We will pursue the Loxitane option because it is something that could also help with sleep and since she stopping the Saphris she is likely to to have more trouble with sleep and she is worried about that.  This could also help with voices and is fairly low risk and causing extraparametal side effects.  We will start very low Stop the Latuda  Stop the Saphris  Start loxapine 10 mg twice a day for 1 week and then 1 in the morning and 2 at night  Call if that is not helping you sleep or if your sleep gets worse.  Bring meds next time to review the pramipexole.  Consider using it as an antidepressant.  That is clearly off label and there is some risk of psychosis which was discussed.  It is also less predictable when used with an antipsychotic regarding the fact  DC Latuda DT NR.    This was a 40-minute appointment.  Follow-up 4 weeks  Lynder Parents MD, DFAPA  Please see After Visit Summary for patient specific instructions.  Future Appointments  [name] Time Provider [place]  02/09/2019  9:50 AM Metta Clines R, DO [facility] None    No orders of the defined types were placed in this encounter.     -------------------------------

## 2018-12-06 ENCOUNTER — Telehealth: Payer: Self-pay | Admitting: Psychiatry

## 2018-12-06 NOTE — Telephone Encounter (Signed)
She's only been on loxapine 20mg  for 3 days.  That's not long enough to help the voices, but could help the sleep in a few doses. She's been difficult to treat so give this more time.  No change in the meds.  Call back next week if not better.

## 2018-12-06 NOTE — Telephone Encounter (Signed)
Left voicemail with  Detailed information

## 2018-12-06 NOTE — Telephone Encounter (Signed)
PATIENT LM ON VM STATING SHE IS STILL NOT SLEEPING,HEARING VOICES, HAVING NIGHTMARES ALONG WITH RESTLESS LEG SYNDROME, PT IS CURRENTLY TAKING 2 MG OF MIRAPEX

## 2018-12-14 ENCOUNTER — Telehealth: Payer: Self-pay | Admitting: Psychiatry

## 2018-12-14 NOTE — Telephone Encounter (Signed)
Pt. Called and said that she is not sleeping and having nightmares again. What do you suggest to do. Please call [phone]

## 2018-12-14 NOTE — Telephone Encounter (Signed)
Called pt. back. She verbalized understanding to increase the doxazosin to 3 of the 2 mg tablets at night. She understands it may increase dizziness in the morning so she needs to give her self time to adjust before getting up to quickly. Pt. Stated that she did start the loxapine and is up to 3 daily. She will call if she has any further questions or complications.

## 2018-12-14 NOTE — Telephone Encounter (Signed)
Tell her to increase the doxazosin to 3 of the 2 mg tablets at night.  She will need to be careful about dizziness in the morning.  This should resolve the nightmares.  Did she start the loxapine?  Did she get up to 3 daily?  Lynder Parents, MD, DFAPA

## 2019-01-04 ENCOUNTER — Ambulatory Visit: Payer: Medicare PPO | Admitting: Psychiatry

## 2019-01-20 ENCOUNTER — Other Ambulatory Visit: Payer: Self-pay | Admitting: Neurology

## 2019-01-21 ENCOUNTER — Other Ambulatory Visit: Payer: Self-pay

## 2019-01-21 MED ORDER — ROPINIROLE HCL 4 MG PO TABS: 8.0000 mg | ORAL_TABLET | Freq: Every day | ORAL | 3 refills | Status: DC

## 2019-01-26 ENCOUNTER — Other Ambulatory Visit: Payer: Self-pay | Admitting: Psychiatry

## 2019-02-01 ENCOUNTER — Encounter: Payer: Self-pay | Admitting: Psychiatry

## 2019-02-01 ENCOUNTER — Other Ambulatory Visit: Payer: Self-pay | Admitting: Psychiatry

## 2019-02-01 ENCOUNTER — Ambulatory Visit (INDEPENDENT_AMBULATORY_CARE_PROVIDER_SITE_OTHER): Payer: Medicare HMO | Admitting: Psychiatry

## 2019-02-01 DIAGNOSIS — F5105 Insomnia due to other mental disorder: Secondary | ICD-10-CM

## 2019-02-01 DIAGNOSIS — F4310 Post-traumatic stress disorder, unspecified: Secondary | ICD-10-CM

## 2019-02-01 DIAGNOSIS — G2581 Restless legs syndrome: Secondary | ICD-10-CM

## 2019-02-01 DIAGNOSIS — F5101 Primary insomnia: Secondary | ICD-10-CM

## 2019-02-01 DIAGNOSIS — F411 Generalized anxiety disorder: Secondary | ICD-10-CM | POA: Diagnosis not present

## 2019-02-01 DIAGNOSIS — F99 Mental disorder, not otherwise specified: Secondary | ICD-10-CM

## 2019-02-01 DIAGNOSIS — F315 Bipolar disorder, current episode depressed, severe, with psychotic features: Secondary | ICD-10-CM

## 2019-02-01 MED ORDER — ARIPIPRAZOLE 10 MG PO TABS: 10.0000 mg | ORAL_TABLET | Freq: Every day | ORAL | 1 refills | Status: DC

## 2019-02-01 MED ORDER — DOXAZOSIN MESYLATE 2 MG PO TABS: 4.0000 mg | ORAL_TABLET | Freq: Every day | ORAL | 2 refills | Status: DC

## 2019-02-01 NOTE — Patient Instructions (Addendum)
Call if worsening voices or mood swings.  Stop loxapine.   Increase doxazosin to 4 mg at night for nightmares  Start Abilify, aripiprazole, 10 mg 1 daily for mood and voices

## 2019-02-01 NOTE — Progress Notes (Signed)
Beth [name] [id number] [birth year] 43 y.o.  Subjective:   Patient ID:  Beth [name] is a 43 y.o. (DOB 06-10-1976) female.  Chief Complaint:  Chief Complaint  Patient presents with  . Follow-up    Medication Management  . Medication Problem    availability DT insurance  . Weight Gain  . Depression   Last seen December 13. HPI  Beth [name] presents to the office today for follow-up of TRD bipolar unstable and sleep problems.  At last visit Latuda stopped sna started loxapine.  Noticed increase weight 10#.  Did not help sleep.  EFA.  Not much sleep.  Depressed a lot without change. NM 4 times weekly. Taking ambien 10 and doxazosin.  Doxazosin helped NM.  Had NM of F every night and would interfere.  Tired of dreaming of him.  They stopped..   Saphris helped her go to sleep.  Before it couldn't get to sleep.  Now average 5 hours sleep.  No naps.  No RLS right now with ropinirole better than pramipexole.  Anxiety all the time generalized about everything always been like that.  Last panic about 2 days ago.  3 times weekly.  Usually triggered by situations like H who might trigger her anger.  Has a lot of anger problems, cycle into that and then into anxiety.  Worry about money, disability review.  Did disability review a couple of months ago.   May avoid going out places DT anxiety and no motivation.  No initiative, enthusiasm.  Also sadness most days.  May cry alone.  M sees it.  Doesn't remember last time depression was under control for at least 8 weeks, not in years.  Blames chronic stressors. Depressed and anxious continue.  Last manic sx a couple of days a few weeks ago.  Insurance change demand she stop Saphris, Latuda, loxapine is $100/month.  She looked up what she can afford risperidone, Geodon, haloperidol, Tegretol  Current stressors $, fear of losing disability. Expects to hear about disability in a month.  Hallucinations-hears her F and then a girl.  Negative things  that she's not good enough, the world would be better without her.  Not suicidal.  Voices usually once daily, no particular time of day.  Shuts down when hears the voices.  Had good realtionship with father who died 3 years ago.  Remote SI but did not hurt herself, was hospitalized.  Prior psychiatric medication trials include Latuda  restless legs, Fanapt which cause restless legs, Saphris which caused weight gain, olanzapine weight gain, Geodon, Seroquel weight gain and restless legs, risperidone restless legs, Lyrica weight gain, lithium side effects, lamotrigine, Equetro with mildly elevated liver enzymes, Lexapro, duloxetine, venlafaxine, bupropion, fluoxetine, doxepin ineffective, Restoril lost effect, sertraline, Ambien, mirtazapine increased appetite, trazodone restlessness, loxapine cost and weight, Fanapt samples, gabapentin.  Review of Systems:  Review of Systems  Neurological: Negative for tremors, weakness and headaches.       RLS controlled   Psychiatric/Behavioral: Positive for decreased concentration, dysphoric mood, hallucinations and sleep disturbance. Negative for agitation, behavioral problems, confusion, self-injury and suicidal ideas. The patient is nervous/anxious. The patient is not hyperactive.     Medications: I have reviewed the patient's current medications.  Current Outpatient Medications  Medication Sig Dispense Refill  . cyclobenzaprine (FLEXERIL) 10 MG tablet Take 1 tablet (10 mg total) by mouth at bedtime. 30 tablet 3  . divalproex (DEPAKOTE ER) 500 MG 24 hr tablet TAKE 2 TABLETS(1000 MG) BY MOUTH AT BEDTIME 180 tablet 1  .  doxazosin (CARDURA) 2 MG tablet Take 1 tablet (2 mg total) by mouth at bedtime. 30 tablet 2  . gabapentin (NEURONTIN) 400 MG capsule 1 po BID and 2 po QHS 360 capsule 1  . lamoTRIgine (LAMICTAL) 200 MG tablet Take 1.5 tablets (300 mg total) by mouth daily. 135 tablet 0  . LORazepam (ATIVAN) 1 MG tablet TAKE 1 TABLET(1 MG) BY MOUTH FOUR  TIMES DAILY 120 tablet 0  . loxapine (LOXITANE) 10 MG capsule 1 twice daily for 1 week, then 1 in am and 2 at night 90 capsule 1  . rOPINIRole (REQUIP) 4 MG tablet Take 2 tablets (8 mg total) by mouth at bedtime. 60 tablet 3  . sertraline (ZOLOFT) 100 MG tablet Take 1 tablet (100 mg total) by mouth daily. 90 tablet 0  . valACYclovir (VALTREX) 500 MG tablet Take one tablet by mouth twice daily for 3-5 days then daily as needed. 90 tablet 4  . zolpidem (AMBIEN) 10 MG tablet TAKE 1/2 TO 1 TABLET(5 TO 10 MG) BY MOUTH AT BEDTIME AS NEEDED FOR SLEEP 30 tablet 0   No current facility-administered medications for this visit.     Medication Side Effects: Other: RLS  Allergies: No Known Allergies  Past Medical History:  Diagnosis Date  . Bipolar disorder ([place])   . HSV (herpes simplex virus) anogenital infection 05/2018  . RLS (restless legs syndrome)     Family History  Problem Relation Age of Onset  . Thyroid disease Mother   . COPD Mother   . Bipolar disorder Mother   . Parkinson's disease Father   . Breast cancer Paternal Grandmother 69  . Bipolar disorder Sister   . Schizophrenia Other     Social History   Socioeconomic History  . Marital status: Married    Spouse name: Not on file  . Number of children: Not on file  . Years of education: Not on file  . Highest education level: Not on file  Occupational History  . Not on file  Social Needs  . Financial resource strain: Not on file  . Food insecurity:    Worry: Not on file    Inability: Not on file  . Transportation needs:    Medical: Not on file    Non-medical: Not on file  Tobacco Use  . Smoking status: Former Research scientist (life sciences)  . Smokeless tobacco: Never Used  Substance and Sexual Activity  . Alcohol use: No  . Drug use: No  . Sexual activity: Yes    Birth control/protection: None    Comment: 1st intercourse 28 yo-5 partners  Lifestyle  . Physical activity:    Days per week: Not on file    Minutes per session: Not on  file  . Stress: Not on file  Relationships  . Social connections:    Talks on phone: Not on file    Gets together: Not on file    Attends religious service: Not on file    Active member of club or organization: Not on file    Attends meetings of clubs or organizations: Not on file    Relationship status: Not on file  . Intimate partner violence:    Fear of current or ex partner: Not on file    Emotionally abused: Not on file    Physically abused: Not on file    Forced sexual activity: Not on file  Other Topics Concern  . 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     Past Medical History, Surgical history, Social history, and Family history were reviewed and updated as appropriate.   Please see review of systems for further details on the patient's review from today.   Objective:   Physical Exam:  There were no vitals taken for this visit.  Physical Exam Constitutional:      Appearance: Normal appearance.  Neurological:     Mental Status: She is alert.     Motor: No tremor.     Gait: Gait normal.  Psychiatric:        Attention and Perception: She is attentive. She perceives auditory hallucinations.        Mood and Affect: Mood is anxious and depressed.        Speech: Speech normal.        Behavior: Behavior normal. Behavior is cooperative.        Thought Content: Thought content is paranoid. Thought content does not include homicidal or suicidal ideation.        Cognition and Memory: Cognition normal.     Comments: Insight and judgment fair. Paranoid a long time that someone is out to get her.     Lab Review:     Component Value Date/Time   NA 137 07/12/2018 0859   K 4.3 07/12/2018 0859   CL 103 07/12/2018 0859   CO2 27 07/12/2018 0859   GLUCOSE 94 07/12/2018 0859   BUN 11 07/12/2018 0859   CREATININE  0.69 07/12/2018 0859   CALCIUM 9.4 07/12/2018 0859   PROT 7.5 09/02/2018 1058   ALBUMIN 4.7 03/20/2017 1100   AST 19 09/02/2018 1058   ALT 13 09/02/2018 1058   ALKPHOS 65 03/20/2017 1100   BILITOT 0.6 09/02/2018 1058       Component Value Date/Time   WBC 6.3 07/12/2018 0859   RBC 4.39 07/12/2018 0859   HGB 14.0 07/12/2018 0859   HCT 40.4 07/12/2018 0859   PLT 336 07/12/2018 0859   MCV 92.0 07/12/2018 0859   MCH 31.9 07/12/2018 0859   MCHC 34.7 07/12/2018 0859   RDW 12.8 07/12/2018 0859   LYMPHSABS 1,884 07/12/2018 0859   MONOABS 570 03/20/2017 1100   EOSABS 239 07/12/2018 0859   BASOSABS 88 07/12/2018 0859    No results found for: POCLITH, LITHIUM   No results found for: PHENYTOIN, PHENOBARB, VALPROATE, CBMZ   .res Assessment: Plan:    Bipolar disorder, current episode depressed, severe, with psychotic features ([place])  Insomnia due to other mental disorder  Generalized anxiety disorder  RLS (restless legs syndrome)  PTSD (post-traumatic stress disorder)   Beth [name] has treatment resistant bipolar disorder with psychotic features versus schizoaffective disorder and other psychiatric diagnoses as well.  She has been very difficult to treat in part because she is very sensitive to antipsychotics and yet has chronic auditory hallucinations.  We discussed difficulty in finding medication that will stabilize her mood and help with the auditory hallucinations which will not also worsen the restless legs.  Extensive history of psychiatric meds tried discussed with the patient.  We discussed in detail the interaction between antipsychotics and restless legs medicines in regard to dopamine.  She has severe chronic mental illness and it is uncertain how much wellness she can achieve but it is certainly reasonable to try some more options. We will increase the doxazosin to  resolve the nightmares to 4 mg HS.[name]  Clozapine option.  Discussed this in detail including the risk of aplastic  anemia and weight gain.  Option retry CBZ and follow liver enzymes as they were only mildly elevated.Disc DDI complications of CBZ and would need to check liver enzymes DT past history of mild elevations.  This is the only option that won't worsen RLS except clozapine.  Option Abilify but wouldn't help sleep.  Easier than CBZ yes. Rx Abilify 10 mg bc needs a minimum dosage.  Discussed potential metabolic side effects associated with atypical antipsychotics, as well as potential risk for movement side effects. Advised pt to contact office if movement side effects occur.   Call if that is not helping you sleep or if your sleep gets worse.  Bring meds next time to review the pramipexole.  Did not bring meds as asked.  Consider using it as an antidepressant.  That is clearly off label and there is some risk of psychosis which was discussed.  It is also less predictable when used with an antipsychotic regarding the fact  All the insurance preferred antipsychotics she listed are highly likely to worsen RLS based on her sensitivity.  Wean loxapine DT cost.  This was a 30-minute appointment.    Follow-up 6 weeks  Call if worsening voices or mood swings.  Lynder Parents MD, DFAPA  Please see After Visit Summary for patient specific instructions.  Future Appointments  Date Time Provider [place]  02/09/2019  9:50 AM Metta Clines R, DO [facility] None    No orders of the defined types were placed in this encounter.     -------------------------------

## 2019-02-02 ENCOUNTER — Other Ambulatory Visit: Payer: Self-pay

## 2019-02-02 ENCOUNTER — Telehealth: Payer: Self-pay | Admitting: Psychiatry

## 2019-02-02 NOTE — Telephone Encounter (Signed)
Doxazosin 2mg  for 2 hs sent to pharmacy yesterday is for only #30- Please update to #60 for one month supply at [facility]

## 2019-02-02 NOTE — Progress Notes (Signed)
Pharmacy sent message about a 90 supply of doxazosin and the quantity incorrectly submitted from provider. Verified with provider rx doxazosin 4 mg tablet one at bedtime #90 no refills

## 2019-02-02 NOTE — Telephone Encounter (Signed)
Pt aware and pharmacy updated with correct rx

## 2019-02-07 NOTE — Progress Notes (Signed)
NEUROLOGY FOLLOW UP OFFICE NOTE  Beth [name] [id number]  HISTORY OF PRESENT ILLNESS: Beth [name] is a 43 year old woman with bipolar disorder, PTSD, generalized anxiety disorder and primary insomnia who follows up for restless leg syndrome.  UPDATE: Her insurance would not cover [place].  Her insurance would not cover regular gabapentin anymore as well.  Instead, she was switched from ropinirole to Mirapex 2 mg 2 to 3 hours before bedtime.  However, she didn't think it was helpful so her psychiatrist changed her back to ropinirole.  Her psychiatrist also started her on Abilify which may aggravated restless leg.  Current medications: ropinirole 8mg  at bedtime, Depakote ER 500mg  at bedtime, lamotrigine 300mg  daily, doxazosin 2mg  at bedtime, eszopiclone 3mg  at bedtime, clonazepam 0.5mg  four times daily, sertraline 100mg  daily, ferrous sulfate 325mg , cyclobenzaprine 10mg  at bedtime  Ferritin level was 54.5  HISTORY:  She started experiencing restless leg symptoms when she was 43 years old after initiating medications for her bipolar disorder.  Every night in bed, she notes painful needles sensation in the legs that causes her to move her legs around.  However, moving the legs or walking around may only mildly help alleviate it.  Comorbidities include bipolar disorder with depression, generalized anxiety disorder, and primary insomnia.  Past medications: Lyrica (weight gain), Mirapex 2mg  2-3 hours before bedtime, gabapentin 800mg /800mg /1600mg , iron supplement  Her father had Parkinson's disease.  PAST MEDICAL HISTORY: Past Medical History:  Diagnosis Date  . Bipolar disorder ([place])   . HSV (herpes simplex virus) anogenital infection 05/2018  . RLS (restless legs syndrome)     MEDICATIONS: Current Outpatient Medications on File Prior to Visit  Medication Sig Dispense Refill  . ARIPiprazole (ABILIFY) 10 MG tablet Take 1 tablet (10 mg total) by mouth daily. 30 tablet 1  .  cyclobenzaprine (FLEXERIL) 10 MG tablet Take 1 tablet (10 mg total) by mouth at bedtime. 30 tablet 3  . divalproex (DEPAKOTE ER) 500 MG 24 hr tablet TAKE 2 TABLETS(1000 MG) BY MOUTH AT BEDTIME 180 tablet 1  . doxazosin (CARDURA) 4 MG tablet Take 1 tablet (4 mg total) by mouth at bedtime. 90 tablet 0  . gabapentin (NEURONTIN) 400 MG capsule 1 po BID and 2 po QHS 360 capsule 1  . lamoTRIgine (LAMICTAL) 200 MG tablet Take 1.5 tablets (300 mg total) by mouth daily. 135 tablet 0  . LORazepam (ATIVAN) 1 MG tablet TAKE 1 TABLET(1 MG) BY MOUTH FOUR TIMES DAILY 120 tablet 0  . rOPINIRole (REQUIP) 4 MG tablet Take 2 tablets (8 mg total) by mouth at bedtime. 60 tablet 3  . sertraline (ZOLOFT) 100 MG tablet Take 1 tablet (100 mg total) by mouth daily. 90 tablet 0  . valACYclovir (VALTREX) 500 MG tablet Take one tablet by mouth twice daily for 3-5 days then daily as needed. 90 tablet 4  . zolpidem (AMBIEN) 10 MG tablet TAKE 1/2 TO 1 TABLET(5 TO 10 MG) BY MOUTH AT BEDTIME AS NEEDED FOR SLEEP 30 tablet 0   No current facility-administered medications on file prior to visit.     ALLERGIES: No Known Allergies  FAMILY HISTORY: Family History  Problem Relation Age of Onset  . Thyroid disease Mother   . COPD Mother   . Bipolar disorder Mother   . Parkinson's disease Father   . Breast cancer Paternal Grandmother 20  . Bipolar disorder Sister   . Schizophrenia Other    SOCIAL HISTORY: Social History   Socioeconomic History  . Marital status: Married  Spouse name: Not on file  . Number of children: Not on file  . Years of education: Not on file  . Highest education level: Not on file  Occupational History  . Not on file  Social Needs  . Financial resource strain: Not on file  . Food insecurity:    Worry: Not on file    Inability: Not on file  . Transportation needs:    Medical: Not on file    Non-medical: Not on file  Tobacco Use  . Smoking status: Former Research scientist (life sciences)  . Smokeless tobacco:  Never Used  Substance and Sexual Activity  . Alcohol use: No  . Drug use: No  . Sexual activity: Yes    Birth control/protection: None    Comment: 1st intercourse 43 yo-5 partners  Lifestyle  . Physical activity:    Days per week: Not on file    Minutes per session: Not on file  . Stress: Not on file  Relationships  . Social connections:    Talks on phone: Not on file    Gets together: Not on file    Attends religious service: Not on file    Active member of club or organization: Not on file    Attends meetings of clubs or organizations: Not on file    Relationship status: Not on file  . Intimate partner violence:    Fear of current or ex partner: Not on file    Emotionally abused: Not on file    Physically abused: Not on file    Forced sexual activity: Not on file  Other Topics Concern  . 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     REVIEW OF SYSTEMS: Constitutional: No fevers, chills, or sweats, no generalized fatigue, change in appetite Eyes: No visual changes, double vision, eye pain Ear, nose and throat: No hearing loss, ear pain, nasal congestion, sore throat Cardiovascular: No chest pain, palpitations Respiratory:  No shortness of breath at rest or with exertion, wheezes GastrointestinaI: No nausea, vomiting, diarrhea, abdominal pain, fecal incontinence Genitourinary:  No dysuria, urinary retention or frequency Musculoskeletal:  No neck pain, back pain Integumentary: No rash, pruritus, skin lesions Neurological: as above Psychiatric: anxiety, insomnia Endocrine: No palpitations, fatigue, diaphoresis, mood swings, change in appetite, change in weight, increased thirst Hematologic/Lymphatic:  No purpura, petechiae. Allergic/Immunologic: no itchy/runny eyes, nasal congestion, recent allergic reactions,  rashes  PHYSICAL EXAM: Blood pressure 104/76, pulse (!) 108, height 5\' 7"  (1.702 m), weight 169 lb (76.7 kg), SpO2 97 %. General: No acute distress.  Patient appears well-groomed.  Head:  Normocephalic/atraumatic Eyes:  Fundi examined but not visualized Neck: supple, no paraspinal tenderness, full range of motion Heart:  Regular rate and rhythm Lungs:  Clear to auscultation bilaterally Back: No paraspinal tenderness Neurological Exam: alert and oriented to person, place, and time. Attention span and concentration intact, recent and remote memory intact, fund of knowledge intact.  Speech fluent and not dysarthric, language intact.  CN II-XII intact. Bulk and tone normal, muscle strength 5/5 throughout.  Sensation to light touch  intact.  Deep tendon reflexes 2+ throughout.  Finger to nose testing intact.  Gait normal, Romberg negative.  IMPRESSION: Restless leg syndrome  PLAN: 1.  In addition to ropinirole 8mg  2-3 hours before bedtime, take carbidopa-levodopa CR 25/100mg  2.  Increase cyclobenzaprine to 20mg  at bedtime 3.  Follow up in 4 months  20 minutes spent face to face with patient, over 50% spent discussing management.  Metta Clines, DO  CC: Glendale Chard, MD

## 2019-02-09 ENCOUNTER — Ambulatory Visit (INDEPENDENT_AMBULATORY_CARE_PROVIDER_SITE_OTHER): Payer: Medicare HMO | Admitting: Neurology

## 2019-02-09 ENCOUNTER — Encounter: Payer: Self-pay | Admitting: Neurology

## 2019-02-09 VITALS — BP 104/76 | HR 108 | Ht 67.0 in | Wt 169.0 lb

## 2019-02-09 DIAGNOSIS — G2581 Restless legs syndrome: Secondary | ICD-10-CM | POA: Diagnosis not present

## 2019-02-09 MED ORDER — CARBIDOPA-LEVODOPA ER 25-100 MG PO TBCR: 1.0000 | EXTENDED_RELEASE_TABLET | Freq: Every day | ORAL | 3 refills | Status: DC

## 2019-02-09 MED ORDER — CARBIDOPA-LEVODOPA 25-100 MG PO TABS: 1.0000 | ORAL_TABLET | Freq: Every day | ORAL | 3 refills | Status: DC

## 2019-02-09 MED ORDER — CYCLOBENZAPRINE HCL 10 MG PO TABS: 20.0000 mg | ORAL_TABLET | Freq: Every day | ORAL | 3 refills | Status: DC

## 2019-02-09 NOTE — Patient Instructions (Addendum)
1.  In addition to ropinirole 8mg  2-3 hours at bedtime, take carbidopa-levodopa CR 25/100mg .  Increase cyclobenzaprine to 20mg  at bedtime 2.  Follow up in 4 months

## 2019-02-19 ENCOUNTER — Other Ambulatory Visit: Payer: Self-pay | Admitting: Psychiatry

## 2019-02-19 DIAGNOSIS — F5101 Primary insomnia: Secondary | ICD-10-CM

## 2019-02-19 DIAGNOSIS — F4310 Post-traumatic stress disorder, unspecified: Secondary | ICD-10-CM

## 2019-02-20 ENCOUNTER — Other Ambulatory Visit: Payer: Self-pay | Admitting: Psychiatry

## 2019-02-20 DIAGNOSIS — F315 Bipolar disorder, current episode depressed, severe, with psychotic features: Secondary | ICD-10-CM

## 2019-02-23 ENCOUNTER — Other Ambulatory Visit: Payer: Self-pay

## 2019-02-23 MED ORDER — LORAZEPAM 1 MG PO TABS: ORAL_TABLET | ORAL | 0 refills | Status: DC

## 2019-02-23 MED ORDER — ZOLPIDEM TARTRATE 10 MG PO TABS: ORAL_TABLET | ORAL | 0 refills | Status: DC

## 2019-02-23 NOTE — Progress Notes (Signed)
Refill request from [facility] for Zolpidem 10mg  #30 and Lorazepam 1mg  #120  Next office visit 03/14/2019

## 2019-03-14 ENCOUNTER — Other Ambulatory Visit: Payer: Self-pay

## 2019-03-14 ENCOUNTER — Ambulatory Visit (INDEPENDENT_AMBULATORY_CARE_PROVIDER_SITE_OTHER): Payer: Medicare HMO | Admitting: Psychiatry

## 2019-03-14 ENCOUNTER — Encounter: Payer: Self-pay | Admitting: Psychiatry

## 2019-03-14 DIAGNOSIS — F4310 Post-traumatic stress disorder, unspecified: Secondary | ICD-10-CM

## 2019-03-14 DIAGNOSIS — F411 Generalized anxiety disorder: Secondary | ICD-10-CM | POA: Diagnosis not present

## 2019-03-14 DIAGNOSIS — F5105 Insomnia due to other mental disorder: Secondary | ICD-10-CM | POA: Diagnosis not present

## 2019-03-14 DIAGNOSIS — F99 Mental disorder, not otherwise specified: Secondary | ICD-10-CM

## 2019-03-14 DIAGNOSIS — F315 Bipolar disorder, current episode depressed, severe, with psychotic features: Secondary | ICD-10-CM

## 2019-03-14 MED ORDER — DIVALPROEX SODIUM ER 500 MG PO TB24: ORAL_TABLET | ORAL | 1 refills | Status: DC

## 2019-03-14 MED ORDER — SERTRALINE HCL 100 MG PO TABS: 100.0000 mg | ORAL_TABLET | Freq: Every day | ORAL | 0 refills | Status: DC

## 2019-03-14 MED ORDER — HALOPERIDOL 2 MG PO TABS: 2.0000 mg | ORAL_TABLET | Freq: Every day | ORAL | 1 refills | Status: DC

## 2019-03-14 NOTE — Progress Notes (Signed)
Beth [name] [id number] Mar 08, 1976 43 y.o.  Subjective:   Patient ID:  Beth [name] is a 43 y.o. (DOB 1976/11/12) female.  Chief Complaint:  Chief Complaint  Patient presents with  . Depression  . Sleeping Problem    nightmares  . Hallucinations  . Follow-up    med changes  . Anxiety  HPI Beth [name] presents to the office today for follow-up of TRD bipolar unstable and sleep problems.  Depression is worst sx without motivation.  Last seen February 01, 2019.  She has been very difficult to treat failed met multiple meds as noted below.  She needed some sort of mood stabilizer and last visit we prescribed Abilify 10 mg.  She was also having nightmares and was prescribed doxazosin 4 mg to improve sleep and get rid of the nightmares.  More depressed than normal.  Abilify making me gain weightand no known benefit..  So hard to lose weight.  Current 167 without change since here.  Weight shot up with Lyrica and lithium.  Some mood swings over the last few months but mostly depression.  Pt reports that mood is Anxious, Depressed, Hopeless and Worthless and describes anxiety as Moderate. Anxiety symptoms include: Excessive Worry,. Pt reports has interrupted sleep, has frequent nighttime awakenings and total 4-5 hours nightly. Unchanged since here and for years.. Pt reports that appetite is good. Pt reports that energy is poor and loss of interest or pleasure in usual activities and poor motivation. Concentration is poor. Suicidal thoughts:  denied by patient.  No history of suicide attempts.  Meds for RLS changed by doctor and it's better.No dizziness with doxazosin.  "Is there anything to take besides Abilify?."  NM 4 times weekly reduced to 1 weekly.. Taking ambien 10 and doxazosin.  Doxazosin helped NM.  Had NM of F every night and would interfere.  Tired of dreaming of him.  They stopped..   Saphris helped her go to sleep.  Before it couldn't get to sleep.  Now average 5 hours sleep.   No naps.  No RLS right now with ropinirole better than pramipexole.  Anxiety all the time generalized about everything always been like that.  Last panic about 2 days ago.  3 times weekly.  Usually triggered by situations like H who might trigger her anger.  Has a lot of anger problems, cycle into that and then into anxiety.  Worry about money, disability review.  Did disability review a couple of months ago.   May avoid going out places DT anxiety and no motivation.  No initiative, enthusiasm.  Also sadness most days.  May cry alone.  M sees it.  Doesn't remember last time depression was under control for at least 8 weeks, not in years.  Blames chronic stressors. Depressed and anxious continue.  Last manic sx a couple of days a few weeks ago.  Insurance change demand she stop Saphris, Latuda, loxapine is $100/month.  She looked up what she can afford risperidone, Geodon, haloperidol, Tegretol  She can't afford Vraylar.  Current stressors $, fear of losing disability. Expects to hear about disability soon.  Hallucinations-hears her F and then a girl especially at night.   Mind won't shut off at night.  Negative things that she's not good enough, the world would be better without her.  Not suicidal.  Voices usually once daily, no particular time of day.  Shuts down when hears the voices.  Had good realtionship with father who died 3 years ago.  Remote SI  but did not hurt herself, was hospitalized.  Prior psychiatric medication trials include Latuda  restless legs, Fanapt which cause restless legs, Loxapine weight, Abilfiy 10, Saphris which caused weight gain and $, olanzapine weight gain, Geodon, Seroquel weight gain and restless legs, risperidone restless legs, Lyrica weight gain, lithium side effects, lamotrigine, Equetro with mildly elevated liver enzymes, Lexapro, duloxetine, venlafaxine, bupropion, fluoxetine, doxepin ineffective, Restoril lost effect, sertraline, Ambien, mirtazapine  increased appetite, trazodone restlessness, loxapine cost and weight, Fanapt samples, gabapentin. Latuda helped mood but can't afford.  Liked saphris some.  Review of Systems:  Review of Systems  Neurological: Negative for tremors, weakness and headaches.       RLS controlled   Psychiatric/Behavioral: Positive for decreased concentration, dysphoric mood, hallucinations and sleep disturbance. Negative for agitation, behavioral problems, confusion, self-injury and suicidal ideas. The patient is nervous/anxious. The patient is not hyperactive.     Medications: I have reviewed the patient's current medications.  Current Outpatient Medications  Medication Sig Dispense Refill  . ARIPiprazole (ABILIFY) 10 MG tablet Take 1 tablet (10 mg total) by mouth daily. 30 tablet 1  . Carbidopa-Levodopa ER (SINEMET CR) 25-100 MG tablet controlled release Take 1 tablet by mouth at bedtime. 30 tablet 3  . cyclobenzaprine (FLEXERIL) 10 MG tablet Take 2 tablets (20 mg total) by mouth at bedtime. 60 tablet 3  . divalproex (DEPAKOTE ER) 500 MG 24 hr tablet TAKE 2 TABLETS(1000 MG) BY MOUTH AT BEDTIME 180 tablet 1  . doxazosin (CARDURA) 4 MG tablet Take 1 tablet (4 mg total) by mouth at bedtime. 90 tablet 0  . lamoTRIgine (LAMICTAL) 200 MG tablet TAKE 1 AND 1/2 TABLETS(300 MG) BY MOUTH DAILY 135 tablet 0  . LORazepam (ATIVAN) 1 MG tablet TAKE 1 TABLET(1 MG) BY MOUTH FOUR TIMES DAILY 120 tablet 0  . rOPINIRole (REQUIP) 4 MG tablet Take 2 tablets (8 mg total) by mouth at bedtime. 60 tablet 3  . sertraline (ZOLOFT) 100 MG tablet Take 1 tablet (100 mg total) by mouth daily. 90 tablet 0  . valACYclovir (VALTREX) 500 MG tablet Take one tablet by mouth twice daily for 3-5 days then daily as needed. 90 tablet 4  . zolpidem (AMBIEN) 10 MG tablet TAKE 1/2 TO 1 TABLET(5 TO 10 MG) BY MOUTH AT BEDTIME AS NEEDED FOR SLEEP 30 tablet 0   No current facility-administered medications for this visit.     Medication Side Effects:  Other: RLS  Allergies: No Known Allergies  Past Medical History:  Diagnosis Date  . Bipolar disorder ([place])   . HSV (herpes simplex virus) anogenital infection 05/2018  . RLS (restless legs syndrome)     Family History  Problem Relation Age of Onset  . Thyroid disease Mother   . COPD Mother   . Bipolar disorder Mother   . Parkinson's disease Father   . Breast cancer Paternal Grandmother 63  . Bipolar disorder Sister   . Schizophrenia Other     Social History   Socioeconomic History  . Marital status: Married    Spouse name: Not on file  . Number of children: Not on file  . Years of education: Not on file  . Highest education level: Not on file  Occupational History  . Not on file  Social Needs  . Financial resource strain: Not on file  . Food insecurity:    Worry: Not on file    Inability: Not on file  . Transportation needs:    Medical: Not on file  Non-medical: Not on file  Tobacco Use  . Smoking status: Former Research scientist (life sciences)  . Smokeless tobacco: Never Used  Substance and Sexual Activity  . Alcohol use: No  . Drug use: No  . Sexual activity: Yes    Birth control/protection: None    Comment: 1st intercourse 47 yo-5 partners  Lifestyle  . Physical activity:    Days per week: Not on file    Minutes per session: Not on file  . Stress: Not on file  Relationships  . Social connections:    Talks on phone: Not on file    Gets together: Not on file    Attends religious service: Not on file    Active member of club or organization: Not on file    Attends meetings of clubs or organizations: Not on file    Relationship status: Not on file  . Intimate partner violence:    Fear of current or ex partner: Not on file    Emotionally abused: Not on file    Physically abused: Not on file    Forced sexual activity: Not on file  Other Topics Concern  . 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     Past Medical History, Surgical history, Social history, and Family history were reviewed and updated as appropriate.   Please see review of systems for further details on the patient's review from today.   Objective:   Physical Exam:  There were no vitals taken for this visit.  Physical Exam Constitutional:      Appearance: Normal appearance.  Neurological:     Mental Status: She is alert.     Motor: No tremor.     Gait: Gait normal.  Psychiatric:        Attention and Perception: She is attentive. She perceives auditory hallucinations.        Mood and Affect: Mood is anxious and depressed.        Speech: Speech normal.        Behavior: Behavior normal. Behavior is cooperative.        Thought Content: Thought content is paranoid. Thought content does not include homicidal or suicidal ideation.        Cognition and Memory: Cognition normal.     Comments: Insight and judgment fair. Paranoid a long time that someone is out to get her Effectively and objectively she does not appear as distressed as she describes.  This observation is chronic.     Lab Review:     Component Value Date/Time   NA 137 07/12/2018 0859   K 4.3 07/12/2018 0859   CL 103 07/12/2018 0859   CO2 27 07/12/2018 0859   GLUCOSE 94 07/12/2018 0859   BUN 11 07/12/2018 0859   CREATININE 0.69 07/12/2018 0859   CALCIUM 9.4 07/12/2018 0859   PROT 7.5 09/02/2018 1058   ALBUMIN 4.7 03/20/2017 1100   AST 19 09/02/2018 1058   ALT 13 09/02/2018 1058   ALKPHOS 65 03/20/2017 1100   BILITOT 0.6 09/02/2018 1058       Component Value Date/Time   WBC 6.3 07/12/2018 0859   RBC 4.39 07/12/2018 0859   HGB 14.0 07/12/2018 0859   HCT 40.4 07/12/2018 0859   PLT 336 07/12/2018 0859   MCV 92.0 07/12/2018 0859  MCH 31.9 07/12/2018 0859   MCHC 34.7 07/12/2018 0859   RDW 12.8 07/12/2018 0859   LYMPHSABS 1,884 07/12/2018 0859    MONOABS 570 03/20/2017 1100   EOSABS 239 07/12/2018 0859   BASOSABS 88 07/12/2018 0859    No results found for: POCLITH, LITHIUM   No results found for: PHENYTOIN, PHENOBARB, VALPROATE, CBMZ   .res Assessment: Plan:    Bipolar disorder, current episode depressed, severe, with psychotic features ([place])  Insomnia due to other mental disorder  Generalized anxiety disorder  PTSD (post-traumatic stress disorder)   Beth [name] has treatment resistant bipolar disorder with psychotic features versus schizoaffective disorder and other psychiatric diagnoses as well.  She has been very difficult to treat in part because she is very sensitive to antipsychotics and yet has chronic auditory hallucinations.  We discussed difficulty in finding medication that will stabilize her mood and help with the auditory hallucinations which will not also worsen the restless legs.  Extensive history of psychiatric meds tried discussed with the patient.  We discussed in detail the interaction between antipsychotics and restless legs medicines in regard to dopamine.  She has severe chronic mental illness and it is uncertain how much wellness she can achieve but it is certainly reasonable to try some more options.  Clozapine option.  Discussed this in detail including the risk of aplastic anemia and weight gain.  Option retry CBZ and follow liver enzymes as they were only mildly elevated.Disc DDI complications of CBZ and would need to check liver enzymes DT past history of mild elevations.  There are a lot of medication interactions.  This is the only option that won't worsen RLS except clozapine.  Option increase lamotrigine but DDI with Depakote and already at decent dose.  DT insurance  Discussed potential metabolic side effects associated with atypical antipsychotics, as well as potential risk for movement side effects. Advised pt to contact office if movement side effects occur.   Call if that is not helping you sleep  or if your sleep gets worse.  Her other physician changed her restless med legs medicine back to ropinirole and added Sinemet and her restless legs is improved.  She is failed multiple antidepressants and multiple classes.  Given the insurance limitations and options the medic next best option might be low-dose haloperidol because she wants one with low weight gain risk. Prescribed haloperidol 2 mg tablet 1 nightly for bipolar disorder chronic hallucinations and insomnia and hopefully it will help her depression.  All the insurance preferred antipsychotics she listed are highly likely to worsen RLS based on her sensitivity.  The prognosis for improvement is very guarded from the use of medication.  She is very prone to EPS or restless legs from medications.  Her insurance will not pay for branded medications which tend to be better for depression and that is her chief complaint.  She is waiting to hear about her disability review and this may be affecting her symptoms.  She could consider ECT.  This was a 45-minute appointment.    Follow-up 6 weeks  Lynder Parents MD, DFAPA  Please see After Visit Summary for patient specific instructions.  Future Appointments  Date Time Provider Finger  06/15/2019 10:50 AM Metta Clines R, DO [facility] None    No orders of the defined types were placed in this encounter.     -------------------------------

## 2019-03-21 ENCOUNTER — Telehealth: Payer: Self-pay | Admitting: Psychiatry

## 2019-03-21 NOTE — Telephone Encounter (Signed)
Patient stated Haldol  Is giving her facial ticks and restless leg. Having nightmares also. Please advise

## 2019-03-21 NOTE — Telephone Encounter (Signed)
Patient has failed multiple, multiple medications in part because she has restless legs with most antipsychotic medications and is concerned about weight gain limiting options.  Stop haloperidol until the restless legs is resolved and then retry it at one half of a 2 mg tablet daily

## 2019-03-22 ENCOUNTER — Other Ambulatory Visit: Payer: Self-pay | Admitting: Psychiatry

## 2019-03-22 DIAGNOSIS — F411 Generalized anxiety disorder: Secondary | ICD-10-CM

## 2019-03-22 DIAGNOSIS — F4310 Post-traumatic stress disorder, unspecified: Secondary | ICD-10-CM

## 2019-03-22 DIAGNOSIS — F315 Bipolar disorder, current episode depressed, severe, with psychotic features: Secondary | ICD-10-CM

## 2019-03-22 NOTE — Telephone Encounter (Signed)
Left voicemail to call back and get recommendation from Dr. Clovis Pu

## 2019-03-23 NOTE — Telephone Encounter (Signed)
Left voicemail to call back for information

## 2019-03-24 ENCOUNTER — Other Ambulatory Visit: Payer: Self-pay | Admitting: Psychiatry

## 2019-03-24 NOTE — Telephone Encounter (Signed)
90 day was submitted by provider 03/14/2019

## 2019-03-24 NOTE — Telephone Encounter (Signed)
Using [facility] or [facility]

## 2019-03-24 NOTE — Telephone Encounter (Signed)
She's aware and verbalized understanding.

## 2019-04-25 ENCOUNTER — Ambulatory Visit: Payer: Medicare HMO | Admitting: Psychiatry

## 2019-04-25 ENCOUNTER — Encounter: Payer: Self-pay | Admitting: Psychiatry

## 2019-04-25 ENCOUNTER — Other Ambulatory Visit: Payer: Self-pay

## 2019-04-25 DIAGNOSIS — F5101 Primary insomnia: Secondary | ICD-10-CM

## 2019-04-25 DIAGNOSIS — F411 Generalized anxiety disorder: Secondary | ICD-10-CM

## 2019-04-25 DIAGNOSIS — G2581 Restless legs syndrome: Secondary | ICD-10-CM

## 2019-04-25 DIAGNOSIS — F315 Bipolar disorder, current episode depressed, severe, with psychotic features: Secondary | ICD-10-CM | POA: Diagnosis not present

## 2019-04-25 DIAGNOSIS — F4310 Post-traumatic stress disorder, unspecified: Secondary | ICD-10-CM | POA: Diagnosis not present

## 2019-04-25 DIAGNOSIS — F5105 Insomnia due to other mental disorder: Secondary | ICD-10-CM | POA: Diagnosis not present

## 2019-04-25 DIAGNOSIS — F99 Mental disorder, not otherwise specified: Secondary | ICD-10-CM

## 2019-04-25 MED ORDER — DOXAZOSIN MESYLATE 4 MG PO TABS: 4.0000 mg | ORAL_TABLET | Freq: Every day | ORAL | 0 refills | Status: DC

## 2019-04-25 MED ORDER — GABAPENTIN 400 MG PO CAPS: ORAL_CAPSULE | ORAL | 2 refills | Status: DC

## 2019-04-25 NOTE — Progress Notes (Signed)
Beth [name] [id number] [birth year] 43 y.o.   Virtual Visit via Telephone Note  I connected with pt by telephone and verified that I am speaking with the correct person using two identifiers.   I discussed the limitations, risks, security and privacy concerns of performing an evaluation and management service by telephone and the availability of in person appointments. I also discussed with the patient that there may be a patient responsible charge related to this service. The patient expressed understanding and agreed to proceed.  I discussed the assessment and treatment plan with the patient. The patient was provided an opportunity to ask questions and all were answered. The patient agreed with the plan and demonstrated an understanding of the instructions.   The patient was advised to call back or seek an in-person evaluation if the symptoms worsen or if the condition fails to improve as anticipated.  I provided 30 minutes of non-face-to-face time during this encounter. The call started at 1030  and ended at 1100. The patient was located at home and the provider was located office.   Subjective:   Patient ID:  Beth [name] is a 43 y.o. (DOB 1976/01/02) female.  Chief Complaint:  Chief Complaint  Patient presents with  . Follow-up    Medication Management  . Anxiety    Increased  . Depression    Increased  . Medication Reaction    Haldol restless legs, Depakote Nausea and vomiting  Anxiety  Symptoms include decreased concentration and nervous/anxious behavior. Patient reports no confusion or suicidal ideas.    Depression         Associated symptoms include decreased concentration.  Associated symptoms include no headaches and no suicidal ideas.  Past medical history includes anxiety.    Beth [name] presents to the office today for follow-up of TRD bipolar unstable and sleep problems.    Last seen March 14, 2019.  At that time low-dose Haldol 2 mg was added for her chronic  hallucinations and Abilify was stopped.  She CO lack of benefit and weight gain with it..  She called back on April 6 stating it was causing facial tics and restless legs and having nightmares.  She was instructed to stop the medication wait till the symptoms resolve and then retry at one half of a 2 mg tablet.  She is failed multiple psych meds because of restless legs with most antipsychotic medications and is concerned about weight gain and insurance will not pay for branded medications which greatly limits options for treatment.  Still has RLS but not as bad  As on the haloperidol 1 mg.  Facial tics resolved.  Still really depressed, moreso than normal but doesn't know why.  Can get gabapentin through [facility] but insurance wouldn't cover. Wants to restart it.  It helped with anxiety and some with RLS.  Was on 400 mg 1 BID and 2 HS..  Tolerated it well.    Voices are some better with the Haldol.  Takes a long time to go to sleep bc mind races generally with focus mainly at night.  Not limited to worry subjects.  Problem with Depakote causing vomiting at night.  Going on for awhile.    .  She has been very difficult to treat failed met multiple meds as noted below.   She was also having nightmares and was prescribed doxazosin 4 mg to improve sleep and get rid of the nightmares.  More depressed than normal.  Abilify making me gain weightand no known  benefit..  So hard to lose weight.  Current 167 without change since here.  Weight shot up with Lyrica and lithium.  Some mood swings over the last few months but mostly depression.  Pt reports that mood is Anxious, Depressed, Hopeless and Worthless and describes anxiety as Moderate. Anxiety symptoms include: Excessive Worry,. Pt reports has interrupted sleep, has frequent nighttime awakenings and total 4-5 hours nightly. Unchanged since here and for years.. Pt reports that appetite is good. Pt reports that energy is poor and loss of interest or pleasure in  usual activities and poor motivation. Concentration is poor. Suicidal thoughts:  denied by patient.  No history of suicide attempts.  Meds for RLS changed by doctor and it's better.No dizziness with doxazosin.  NM 4 times weekly reduced to 1 weekly.. Taking ambien 10 and doxazosin.  Doxazosin helped NM.  Had NM of F every night and would interfere.  Tired of dreaming of him.  They stopped..   Saphris helped her go to sleep.  Before it couldn't get to sleep.  Now average 5 hours sleep.  No naps.  No RLS right now with ropinirole better than pramipexole.  Anxiety all the time generalized about everything always been like that.  Last panic about 2 days ago.  3 times weekly.  Usually triggered by situations like H who might trigger her anger.  Has a lot of anger problems, cycle into that and then into anxiety.  Worry about money, disability review.  Did disability review a couple of months ago.   May avoid going out places DT anxiety and no motivation.  No initiative, enthusiasm.  Also sadness most days.  May cry alone.  M sees it.  Doesn't remember last time depression was under control for at least 8 weeks, not in years.  Blames chronic stressors. Depressed and anxious continue.  Insurance change demand she stop Saphris, Latuda, loxapine is $100/month.  She looked up what she can afford risperidone, Geodon, haloperidol, Tegretol  She can't afford Vraylar.  Current stressors $, fear of losing disability. Expects to hear about disability soon.  Hallucinations-hears her F and then a girl especially at night.   Mind won't shut off at night.  Negative things that she's not good enough, the world would be better without her.  Not suicidal.  Voices usually once daily, no particular time of day.  Shuts down when hears the voices.  Had good realtionship with father who died 3 years ago.  Remote SI but did not hurt herself, was hospitalized.  Prior psychiatric medication trials include Latuda  restless  legs, Fanapt which cause restless legs, Loxapine weight, Abilfiy 10, Saphris which caused weight gain and $, olanzapine weight gain, Geodon, Seroquel weight gain and restless legs, risperidone restless legs, Lyrica weight gain, lithium side effects, lamotrigine, Equetro with mildly elevated liver enzymes, Lexapro, duloxetine, venlafaxine, bupropion, fluoxetine, doxepin ineffective, Restoril lost effect, sertraline, Ambien, mirtazapine increased appetite, trazodone restlessness, loxapine cost and weight, Fanapt samples, gabapentin. Latuda helped mood but can't afford.  Liked saphris some.  Review of Systems:  Review of Systems  Neurological: Negative for tremors, weakness and headaches.       RLS controlled   Psychiatric/Behavioral: Positive for decreased concentration, depression, dysphoric mood, hallucinations and sleep disturbance. Negative for agitation, behavioral problems, confusion, self-injury and suicidal ideas. The patient is nervous/anxious. The patient is not hyperactive.     Medications: I have reviewed the patient's current medications.  Current Outpatient Medications  Medication Sig Dispense Refill  .  Carbidopa-Levodopa ER (SINEMET CR) 25-100 MG tablet controlled release Take 1 tablet by mouth at bedtime. 30 tablet 3  . cyclobenzaprine (FLEXERIL) 10 MG tablet Take 2 tablets (20 mg total) by mouth at bedtime. 60 tablet 3  . divalproex (DEPAKOTE ER) 500 MG 24 hr tablet TAKE 2 TABLETS(1000 MG) BY MOUTH AT BEDTIME 180 tablet 1  . doxazosin (CARDURA) 4 MG tablet Take 1 tablet (4 mg total) by mouth at bedtime. 90 tablet 0  . haloperidol (HALDOL) 2 MG tablet Take 1 tablet (2 mg total) by mouth at bedtime. (Patient taking differently: Take 1 mg by mouth at bedtime. ) 30 tablet 1  . lamoTRIgine (LAMICTAL) 200 MG tablet TAKE 1 AND 1/2 TABLETS(300 MG) BY MOUTH DAILY 135 tablet 0  . LORazepam (ATIVAN) 1 MG tablet TAKE 1 TABLET BY MOUTH FOUR TIMES DAILY 120 tablet 2  . rOPINIRole (REQUIP) 4 MG  tablet Take 2 tablets (8 mg total) by mouth at bedtime. 60 tablet 3  . sertraline (ZOLOFT) 100 MG tablet Take 1 tablet (100 mg total) by mouth daily. 90 tablet 0  . valACYclovir (VALTREX) 500 MG tablet Take one tablet by mouth twice daily for 3-5 days then daily as needed. 90 tablet 4  . zolpidem (AMBIEN) 10 MG tablet TAKE 1/2 TO 1 TABLET BY MOUTH EVERY NIGHT AT BEDTIME AS NEEDED FOR SLEEP 30 tablet 2  . gabapentin (NEURONTIN) 400 MG capsule 1 twice daily and 2 in the evening (Patient not taking: Reported on 04/25/2019) 120 capsule 2   No current facility-administered medications for this visit.     Medication Side Effects: Other: RLS  Allergies: No Known Allergies  Past Medical History:  Diagnosis Date  . Bipolar disorder ([place])   . HSV (herpes simplex virus) anogenital infection 05/2018  . RLS (restless legs syndrome)     Family History  Problem Relation Age of Onset  . Thyroid disease Mother   . COPD Mother   . Bipolar disorder Mother   . Parkinson's disease Father   . Breast cancer Paternal Grandmother 70  . Bipolar disorder Sister   . Schizophrenia Other     Social History   Socioeconomic History  . Marital status: Married    Spouse name: Not on file  . Number of children: Not on file  . Years of education: Not on file  . Highest education level: Not on file  Occupational History  . Not on file  Social Needs  . Financial resource strain: Not on file  . Food insecurity:    Worry: Not on file    Inability: Not on file  . Transportation needs:    Medical: Not on file    Non-medical: Not on file  Tobacco Use  . Smoking status: Former Research scientist (life sciences)  . Smokeless tobacco: Never Used  Substance and Sexual Activity  . Alcohol use: No  . Drug use: No  . Sexual activity: Yes    Birth control/protection: None    Comment: 1st intercourse 53 yo-5 partners  Lifestyle  . Physical activity:    Days per week: Not on file    Minutes per session: Not on file  . Stress: Not on  file  Relationships  . Social connections:    Talks on phone: Not on file    Gets together: Not on file    Attends religious service: Not on file    Active member of club or organization: Not on file    Attends meetings of clubs or organizations:  Not on file    Relationship status: Not on file  . Intimate partner violence:    Fear of current or ex partner: Not on file    Emotionally abused: Not on file    Physically abused: Not on file    Forced sexual activity: Not on file  Other Topics Concern  . 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     Past Medical History, Surgical history, Social history, and Family history were reviewed and updated as appropriate.   Please see review of systems for further details on the patient's review from today.   Objective:   Physical Exam:  There were no vitals taken for this visit.  Physical Exam Constitutional:      Appearance: Normal appearance.  Neurological:     Mental Status: She is alert.     Motor: No tremor.     Gait: Gait normal.  Psychiatric:        Attention and Perception: She is attentive. She perceives auditory hallucinations.        Mood and Affect: Mood is anxious and depressed.        Speech: Speech normal.        Behavior: Behavior normal. Behavior is cooperative.        Thought Content: Thought content is paranoid. Thought content does not include homicidal or suicidal ideation.        Cognition and Memory: Cognition normal.     Comments: Insight and judgment fair. Paranoid a long time that someone is out to get her Effectively and objectively she does not appear as distressed as she describes.  This observation is chronic.     Lab Review:     Component Value Date/Time   NA 137 07/12/2018 0859   K 4.3 07/12/2018 0859   CL 103 07/12/2018 0859   CO2 27  07/12/2018 0859   GLUCOSE 94 07/12/2018 0859   BUN 11 07/12/2018 0859   CREATININE 0.69 07/12/2018 0859   CALCIUM 9.4 07/12/2018 0859   PROT 7.5 09/02/2018 1058   ALBUMIN 4.7 03/20/2017 1100   AST 19 09/02/2018 1058   ALT 13 09/02/2018 1058   ALKPHOS 65 03/20/2017 1100   BILITOT 0.6 09/02/2018 1058       Component Value Date/Time   WBC 6.3 07/12/2018 0859   RBC 4.39 07/12/2018 0859   HGB 14.0 07/12/2018 0859   HCT 40.4 07/12/2018 0859   PLT 336 07/12/2018 0859   MCV 92.0 07/12/2018 0859   MCH 31.9 07/12/2018 0859   MCHC 34.7 07/12/2018 0859   RDW 12.8 07/12/2018 0859   LYMPHSABS 1,884 07/12/2018 0859   MONOABS 570 03/20/2017 1100   EOSABS 239 07/12/2018 0859   BASOSABS 88 07/12/2018 0859    No results found for: POCLITH, LITHIUM   No results found for: PHENYTOIN, PHENOBARB, VALPROATE, CBMZ   .res Assessment: Plan:    Bipolar disorder, current episode depressed, severe, with psychotic features ([place])  Primary insomnia - Plan: doxazosin (CARDURA) 4 MG tablet  PTSD (post-traumatic stress disorder) - Plan: doxazosin (CARDURA) 4 MG tablet  Insomnia due to other mental disorder  RLS (restless legs syndrome)  Generalized anxiety disorder   Beth [name] has treatment resistant bipolar disorder with psychotic features versus schizoaffective disorder and other psychiatric diagnoses as well.  She  has been very difficult to treat in part because she is very sensitive to antipsychotics and yet has chronic auditory hallucinations.  We discussed difficulty in finding medication that will stabilize her mood and help with the auditory hallucinations which will not also worsen the restless legs.  Extensive history of psychiatric meds tried discussed with the patient.  We discussed in detail the interaction between antipsychotics and restless legs medicines in regard to dopamine.  She has severe chronic mental illness and it is uncertain how much wellness she can achieve but it is certainly  reasonable to try some more options.  Clozapine option.  Discussed this in detail including the risk of aplastic anemia and weight gain.  Option retry CBZ and follow liver enzymes as they were only mildly elevated.Disc DDI complications of CBZ and would need to check liver enzymes DT past history of mild elevations.  There are a lot of medication interactions.  This is the only option that won't worsen RLS except clozapine.  Option increase lamotrigine but DDI with Depakote and already at decent dose. Split Depakote to 500 ER BID should stop the N/V.  She agrees.  DT insurance  Discussed potential metabolic side effects associated with atypical antipsychotics, as well as potential risk for movement side effects. Advised pt to contact office if movement side effects occur.   Call if that is not helping you sleep or if your sleep gets worse.  Doxazosin is helping NM.    Restart Gabapentin for off label anxiety and RLS at previous dosage.  $00 BID and 800 pm.  Disc SE and timing.  Her other physician changed her restless med legs medicine back to ropinirole and added Sinemet and her restless legs is improved.  She is failed multiple antidepressants and multiple classes.  Given the insurance limitations and options the medic next best option might be low-dose haloperidol because she wants one with low weight gain risk. She reports some improvement in hallucinations on the haloperidol 1 mg and is tolerating it reasonably well.  All the insurance preferred antipsychotics she listed are highly likely to worsen RLS based on her sensitivity.  The prognosis for improvement is very guarded from the use of medication.  She is very prone to EPS or restless legs from medications.  Her insurance will not pay for branded medications which tend to be better for depression and that is her chief complaint.  She is waiting to hear about her disability review and this may be affecting her symptoms.  She could  consider ECT.  Follow-up 6 weeks  Lynder Parents MD, DFAPA  Please see After Visit Summary for patient specific instructions.  Future Appointments  Date Time Provider [place]  06/07/2019 10:30 AM Cottle, Billey Co., MD [facility] None  06/15/2019 10:50 AM Pieter Partridge, DO [facility] None    No orders of the defined types were placed in this encounter.     -------------------------------

## 2019-04-26 ENCOUNTER — Telehealth: Payer: Self-pay | Admitting: Neurology

## 2019-04-26 NOTE — Telephone Encounter (Signed)
Called and advised Pt, Per Dr. [name], psychiatrist should make that decision since that Dr is changing and adding medications.

## 2019-04-26 NOTE — Telephone Encounter (Signed)
Patient called regarding her taking Carbidopa Levodopa medication and her Psychiatrist putting her on Neurontin. She is needing to know should she stop her Levodopa? Please Call. Thanks

## 2019-05-17 ENCOUNTER — Other Ambulatory Visit: Payer: Self-pay

## 2019-05-17 MED ORDER — ROPINIROLE HCL 4 MG PO TABS: 8.0000 mg | ORAL_TABLET | Freq: Every day | ORAL | 5 refills | Status: DC

## 2019-05-20 ENCOUNTER — Other Ambulatory Visit: Payer: Self-pay | Admitting: Psychiatry

## 2019-05-20 DIAGNOSIS — F315 Bipolar disorder, current episode depressed, severe, with psychotic features: Secondary | ICD-10-CM

## 2019-06-07 ENCOUNTER — Ambulatory Visit (INDEPENDENT_AMBULATORY_CARE_PROVIDER_SITE_OTHER): Payer: Medicare HMO | Admitting: Psychiatry

## 2019-06-07 ENCOUNTER — Encounter: Payer: Self-pay | Admitting: Psychiatry

## 2019-06-07 ENCOUNTER — Other Ambulatory Visit: Payer: Self-pay

## 2019-06-07 DIAGNOSIS — F5105 Insomnia due to other mental disorder: Secondary | ICD-10-CM

## 2019-06-07 DIAGNOSIS — F4310 Post-traumatic stress disorder, unspecified: Secondary | ICD-10-CM | POA: Diagnosis not present

## 2019-06-07 DIAGNOSIS — G2581 Restless legs syndrome: Secondary | ICD-10-CM

## 2019-06-07 DIAGNOSIS — F315 Bipolar disorder, current episode depressed, severe, with psychotic features: Secondary | ICD-10-CM | POA: Diagnosis not present

## 2019-06-07 DIAGNOSIS — F99 Mental disorder, not otherwise specified: Secondary | ICD-10-CM

## 2019-06-07 DIAGNOSIS — F411 Generalized anxiety disorder: Secondary | ICD-10-CM

## 2019-06-07 MED ORDER — DIVALPROEX SODIUM ER 500 MG PO TB24: ORAL_TABLET | ORAL | 1 refills | Status: DC

## 2019-06-07 MED ORDER — SERTRALINE HCL 100 MG PO TABS: 100.0000 mg | ORAL_TABLET | Freq: Every day | ORAL | 1 refills | Status: DC

## 2019-06-07 NOTE — Progress Notes (Signed)
Beth [name] [id number] 1976/03/24 43 y.o.    Subjective:   Patient ID:  Beth [name] is a 42 y.o. (DOB Apr 22, 1976) female.  Chief Complaint:  Chief Complaint  Patient [name] with  . Hallucinations  . Anxiety  . Depression  . Medication Problem    NV  Anxiety Symptoms include decreased concentration and nervous/anxious behavior. Patient reports no confusion or suicidal ideas.    Depression        Associated symptoms include decreased concentration.  Associated symptoms include no headaches and no suicidal ideas.  Past medical history includes anxiety.    Beth [name] to the office today for follow-up of TRD bipolar unstable which is difficult to treat due to med sensitivity and sleep problems.    Last seen May 11.  The Depakote was split to 500 twice daily in hopes of reducing complaints of nausea and vomiting.  Also for gabapentin due to anxiety it was increased to 400 twice daily and 800 mg nightly and also hopes of helping her sleep.  Overall still gets NV only at night after meds.  Doesn't like Haldol, no benefit and gets RLS with it.  No mood swings.  Pt reports that mood is Anxious, Depressed and both moderate.  Anxiety symptoms include: Excessive Worry,. Pt reports sleep affecting by NV when goes to bed and 5-6 hours now. Can't nap DT RLS Pt reports that appetite is good. Exercise and losing weight.  Pt reports that energy is improved and improved. Concentration is improved. Suicidal thoughts:  denied by patient.  Can get gabapentin through [facility] but insurance wouldn't cover.   It helped with anxiety and some with RLS on 400 mg 1 BID and 2 HS..  Tolerated it well.    Voices not as bad.  Month of 06/26/2023 is hard bc Dad died 4 years ago and father's day and his birthday is this month.  Problem with Depakote causing vomiting at night.  Going on for awhile.    On Sinemet 3 mos per neuro and sees him next month.  Reduced caffeine to 1 coffee am and 1 soda.  .   She has been very difficult to treat failed met multiple meds as noted below.   She was also having nightmares and was prescribed doxazosin 4 mg to improve sleep and get rid of the nightmares.  Meds for RLS changed by doctor and it's better.No dizziness with doxazosin.  NM 4 times weekly reduced to 1 weekly.. Taking ambien 10 and doxazosin.  Doxazosin helped NM.  Had NM of F every night and would interfere.  Tired of dreaming of him.  They stopped..   Saphris helped her go to sleep.  Before it couldn't get to sleep.  Now average 5 hours sleep.  No naps.  No RLS right now with ropinirole better than pramipexole.  Insurance change demand she stop Saphris, Latuda, loxapine is $100/month.  She looked up what she can afford risperidone, Geodon, haloperidol, Tegretol  She can't afford Vraylar.  Current stressors $, fear of losing disability. Expects to hear about disability soon but hasn't yet.  Hallucinations-hears her F and then a girl especially at night.   Mind won't shut off at night.  Negative things that she's not good enough, the world would be better without her.  Not suicidal.  Voices usually once daily, no particular time of day.  Shuts down when hears the voices.  Had good realtionship with father who died 3 years ago.  Remote SI  but did not hurt herself, was hospitalized.  Prior psychiatric medication trials include Latuda  restless legs, Fanapt which cause restless legs, Loxapine weight, Abilfiy 10, Saphris which caused weight gain and $, olanzapine weight gain, Geodon, Seroquel weight gain and restless legs, risperidone restless legs, Lyrica weight gain, lithium side effects, lamotrigine, Equetro with mildly elevated liver enzymes, Lexapro, duloxetine, venlafaxine, bupropion, fluoxetine, doxepin ineffective, Restoril lost effect, sertraline, Ambien, mirtazapine increased appetite, trazodone restlessness, loxapine cost and weight, Fanapt samples, gabapentin.  Haloperidol 2 Latuda  helped mood but can't afford.  Liked saphris some.  Review of Systems:  Review of Systems  Neurological: Negative for tremors, weakness and headaches.       RLS controlled   Psychiatric/Behavioral: Positive for decreased concentration, depression, dysphoric mood, hallucinations and sleep disturbance. Negative for agitation, behavioral problems, confusion, self-injury and suicidal ideas. The patient is nervous/anxious. The patient is not hyperactive.     Medications: I have reviewed the patient's current medications.  Current Outpatient Medications  Medication Sig Dispense Refill  . Carbidopa-Levodopa ER (SINEMET CR) 25-100 MG tablet controlled release Take 1 tablet by mouth at bedtime. 30 tablet 3  . cyclobenzaprine (FLEXERIL) 10 MG tablet Take 2 tablets (20 mg total) by mouth at bedtime. 60 tablet 3  . divalproex (DEPAKOTE ER) 500 MG 24 hr tablet TAKE 2 TABLETS(1000 MG) BY MOUTH AT BEDTIME 180 tablet 1  . doxazosin (CARDURA) 4 MG tablet Take 1 tablet (4 mg total) by mouth at bedtime. 90 tablet 0  . gabapentin (NEURONTIN) 400 MG capsule 1 twice daily and 2 in the evening 120 capsule 2  . haloperidol (HALDOL) 2 MG tablet Take 1 tablet (2 mg total) by mouth at bedtime. (Patient taking differently: Take 1 mg by mouth at bedtime. ) 30 tablet 1  . lamoTRIgine (LAMICTAL) 200 MG tablet TAKE 1 AND 1/2 TABLETS(300 MG) BY MOUTH DAILY 135 tablet 0  . LORazepam (ATIVAN) 1 MG tablet TAKE 1 TABLET BY MOUTH FOUR TIMES DAILY 120 tablet 2  . rOPINIRole (REQUIP) 4 MG tablet Take 2 tablets (8 mg total) by mouth at bedtime. 60 tablet 5  . sertraline (ZOLOFT) 100 MG tablet Take 1 tablet (100 mg total) by mouth daily. 90 tablet 0  . valACYclovir (VALTREX) 500 MG tablet Take one tablet by mouth twice daily for 3-5 days then daily as needed. 90 tablet 4  . zolpidem (AMBIEN) 10 MG tablet TAKE 1/2 TO 1 TABLET BY MOUTH EVERY NIGHT AT BEDTIME AS NEEDED FOR SLEEP 30 tablet 2   No current facility-administered  medications for this visit.     Medication Side Effects: Other: RLS  Allergies: No Known Allergies  Past Medical History:  Diagnosis Date  . Bipolar disorder ([place])   . HSV (herpes simplex virus) anogenital infection 05/2018  . RLS (restless legs syndrome)     Family History  Problem Relation Age of Onset  . Thyroid disease Mother   . COPD Mother   . Bipolar disorder Mother   . Parkinson's disease Father   . Breast cancer Paternal Grandmother 45  . Bipolar disorder Sister   . Schizophrenia Other     Social History   Socioeconomic History  . Marital status: Married    Spouse name: Not on file  . Number of children: Not on file  . Years of education: Not on file  . Highest education level: Not on file  Occupational History  . Not on file  Social Needs  . Financial resource strain: Not on  file  . Food insecurity    Worry: Not on file    Inability: Not on file  . Transportation needs    Medical: Not on file    Non-medical: Not on file  Tobacco Use  . Smoking status: Former Research scientist (life sciences)  . Smokeless tobacco: Never Used  Substance and Sexual Activity  . Alcohol use: No  . Drug use: No  . Sexual activity: Yes    Birth control/protection: None    Comment: 1st intercourse 39 yo-5 partners  Lifestyle  . Physical activity    Days per week: Not on file    Minutes per session: Not on file  . Stress: Not on file  Relationships  . Social Herbalist on phone: Not on file    Gets together: Not on file    Attends religious service: Not on file    Active member of club or organization: Not on file    Attends meetings of clubs or organizations: Not on file    Relationship status: Not on file  . Intimate partner violence    Fear of current or ex partner: Not on file    Emotionally abused: Not on file    Physically abused: Not on file    Forced sexual activity: Not on file  Other Topics Concern  . 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     Past Medical History, Surgical history, Social history, and Family history were reviewed and updated as appropriate.   Please see review of systems for further details on the patient's review from today.   Objective:   Physical Exam:  There were no vitals taken for this visit.  Physical Exam Constitutional:      Appearance: Normal appearance.  Neurological:     Mental Status: She is alert.     Motor: No tremor.     Gait: Gait normal.  Psychiatric:        Attention and Perception: She is attentive. She perceives auditory hallucinations.        Mood and Affect: Mood is anxious and depressed.        Speech: Speech normal. Speech is not rapid and pressured.        Behavior: Behavior normal. Behavior is cooperative.        Thought Content: Thought content is paranoid. Thought content does not include homicidal or suicidal ideation.        Cognition and Memory: Cognition normal.     Comments: Insight and judgment fair. Paranoid a long time that someone is out to get her Effectively and objectively she does not appear as distressed as she describes.  This observation is chronic.     Lab Review:     Component Value Date/Time   NA 137 07/12/2018 0859   K 4.3 07/12/2018 0859   CL 103 07/12/2018 0859   CO2 27 07/12/2018 0859   GLUCOSE 94 07/12/2018 0859   BUN 11 07/12/2018 0859   CREATININE 0.69 07/12/2018 0859   CALCIUM 9.4 07/12/2018 0859   PROT 7.5 09/02/2018 1058   ALBUMIN 4.7 03/20/2017 1100   AST 19 09/02/2018 1058   ALT 13 09/02/2018 1058   ALKPHOS 65 03/20/2017 1100   BILITOT 0.6 09/02/2018 1058       Component Value  Date/Time   WBC 6.3 07/12/2018 0859   RBC 4.39 07/12/2018 0859   HGB 14.0 07/12/2018 0859   HCT 40.4 07/12/2018 0859   PLT 336 07/12/2018 0859   MCV 92.0 07/12/2018 0859   MCH 31.9 07/12/2018 0859   MCHC  34.7 07/12/2018 0859   RDW 12.8 07/12/2018 0859   LYMPHSABS 1,884 07/12/2018 0859   MONOABS 570 03/20/2017 1100   EOSABS 239 07/12/2018 0859   BASOSABS 88 07/12/2018 0859    No results found for: POCLITH, LITHIUM   No results found for: PHENYTOIN, PHENOBARB, VALPROATE, CBMZ   .res Assessment: Plan:    Beth [name] was seen today for hallucinations, anxiety, depression and medication problem.  Diagnoses and all orders for this visit:  Insomnia due to other mental disorder  Bipolar disorder, current episode depressed, severe, with psychotic features (HCC) -     divalproex (DEPAKOTE ER) 500 MG 24 hr tablet; TAKE 2 TABLETS(1000 MG) BY MOUTH AT BEDTIME -     sertraline (ZOLOFT) 100 MG tablet; Take 1 tablet (100 mg total) by mouth daily.  Generalized anxiety disorder -     sertraline (ZOLOFT) 100 MG tablet; Take 1 tablet (100 mg total) by mouth daily.  PTSD (post-traumatic stress disorder) -     sertraline (ZOLOFT) 100 MG tablet; Take 1 tablet (100 mg total) by mouth daily.  Restless legs syndrome    Greater than 50% of face to face time with patient was spent on counseling and coordination of care. We discussed the following: Beth [name] has treatment resistant bipolar disorder with psychotic features versus schizoaffective disorder and other psychiatric diagnoses as well.  She has been very difficult to treat in part because she is very sensitive to antipsychotics and yet has chronic auditory hallucinations.  We discussed difficulty in finding medication that will stabilize her mood and help with the auditory hallucinations which will not also worsen the restless legs.  Extensive history of psychiatric meds tried discussed with the patient.  We discussed in detail the interaction between antipsychotics and restless legs medicines in regard to dopamine.  She has severe chronic mental illness and it is uncertain how much wellness she can achieve but it is certainly reasonable to try some more  options.  Clozapine option.  Discussed this in detail including the risk of aplastic anemia and weight gain.  Option retry CBZ and follow liver enzymes as they were only mildly elevated.Disc DDI complications of CBZ and would need to check liver enzymes DT past history of mild elevations.  There are a lot of medication interactions.  This is the only option that won't worsen RLS except clozapine.  Option increase lamotrigine but DDI with Depakote and already at decent dose. Split Depakote to 500 ER BID and get it away from HS to see if it stop the N/V.  She agrees.  NV may be coming from Sinemet with ropinirole.  DT insurance  Discussed potential metabolic side effects associated with atypical antipsychotics, as well as potential risk for movement side effects. Advised pt to contact office if movement side effects occur.  Stop haloperidol 1 mg DT NR at low dose and SE at higher dosage.  Chronic AH are manageable at this time.  Don't want to start clozapine unless necesssary DT weight gain and other risks.  Doxazosin is helping NM.    continue Gabapentin for off label anxiety and RLS at previous dosage.  400 BID and 800 pm.  Disc SE and timing.  Ports this is helped her  anxiety.  Her other physician changed her restless med legs medicine back to ropinirole and added Sinemet and her restless legs is improved. Try stop caffeine after noon DT RLS.  All the insurance preferred antipsychotics she listed are highly likely to worsen RLS based on her sensitivity.  The prognosis for improvement is very guarded from the use of medication.  She is very prone to EPS or restless legs from medications.  Her insurance will not pay for branded medications which tend to be better for depression and that is her chief complaint.  She is waiting to hear about her disability review and this may be affecting her symptoms.  She could consider ECT.  Follow-up in a couple of months.  Call if symptoms get  worse  35-minute appointment  Lynder Parents MD, DFAPA  Please see After Visit Summary for patient specific instructions.  Future Appointments  Date Time Provider [place]  06/15/2019 10:50 AM Metta Clines R, DO [facility] None    No orders of the defined types were placed in this encounter.     -------------------------------

## 2019-06-12 ENCOUNTER — Other Ambulatory Visit: Payer: Self-pay | Admitting: Psychiatry

## 2019-06-12 DIAGNOSIS — F411 Generalized anxiety disorder: Secondary | ICD-10-CM

## 2019-06-12 DIAGNOSIS — F315 Bipolar disorder, current episode depressed, severe, with psychotic features: Secondary | ICD-10-CM

## 2019-06-12 DIAGNOSIS — F4310 Post-traumatic stress disorder, unspecified: Secondary | ICD-10-CM

## 2019-06-13 ENCOUNTER — Other Ambulatory Visit: Payer: Self-pay | Admitting: Neurology

## 2019-06-13 ENCOUNTER — Telehealth: Payer: Self-pay | Admitting: Neurology

## 2019-06-13 MED ORDER — CYCLOBENZAPRINE HCL 10 MG PO TABS: 20.0000 mg | ORAL_TABLET | Freq: Every day | ORAL | 3 refills | Status: DC

## 2019-06-13 NOTE — Telephone Encounter (Signed)
Patient is calling in about muscle relaxer refill at [facility]- this is too expensive for her there and she said she could get it cheaper at the [facility] on [address]. Please resubmit this to new pharm. Thanks!

## 2019-06-14 NOTE — Progress Notes (Signed)
Virtual Visit via Video Note The purpose of this virtual visit is to provide medical care while limiting exposure to the novel coronavirus.    Consent was obtained for video visit:  Yes.   Answered questions that patient had about telehealth interaction:  Yes.   I discussed the limitations, risks, security and privacy concerns of performing an evaluation and management service by telemedicine. I also discussed with the patient that there may be a patient responsible charge related to this service. The patient expressed understanding and agreed to proceed.  Pt location: Home Physician Location: Home Name of referring provider:  Glendale Chard, MD I connected with Beth [name] at patients initiation/request on 06/15/2019 at 10:50 AM EDT by video enabled telemedicine application and verified that I am speaking with the correct person using two identifiers. Pt MRN:  [id number] Pt DOB:  06-02-1976 Video Participants:  Beth [name]   History of Present Illness:  Beth [name] is a 43 year old woman with bipolar disorder, PTSD, generalized anxiety disorder and primary insomnia who follows up for restless leg syndrome.  UPDATE: Since last visit, her psychiatrist restarted her on gabapentin.    Current medications: ropinirole 8mg  at bedtime, carbidopa-levodopa CR 25/100mg  at bedtime; gabapentin 400mg /400mg /800mg ; Depakote ER 500mg  at bedtime, lamotrigine 300mg  daily, doxazosin 2mg  at bedtime, eszopiclone 3mg  at bedtime, clonazepam 0.5mg  four times daily, sertraline 100mg  daily, ferrous sulfate 325mg , cyclobenzaprine 10mg  at bedtime  Restless leg is improved.  She thinks it is the gabapentin that is most helpful and would like to discontinue the Sinemet.    HISTORY:  She started experiencing restless leg symptoms when she was 43 years old after initiating medications for her bipolar disorder.  Every night in bed, she notes painful needles sensation in the legs that causes her to move her  legs around.  However, moving the legs or walking around may only mildly help alleviate it.  Comorbidities include bipolar disorder with depression, generalized anxiety disorder, and primary insomnia.  Ferritin level was 54.5.  Past medications: Lyrica (weight gain), Mirapex 2mg  2-3 hours before bedtime, gabapentin 800mg /800mg /1600mg , iron supplement.  Her insurance would not cover [place]  Her father had Parkinson's disease.  Past Medical History: Past Medical History:  Diagnosis Date  . Bipolar disorder ([place])   . HSV (herpes simplex virus) anogenital infection 05/2018  . RLS (restless legs syndrome)     Medications: Outpatient Encounter Medications as of 06/15/2019  Medication Sig  . Carbidopa-Levodopa ER (SINEMET CR) 25-100 MG tablet controlled release Take 1 tablet by mouth at bedtime.  . cyclobenzaprine (FLEXERIL) 10 MG tablet TAKE 2 TABLETS(20 MG) BY MOUTH AT BEDTIME  . cyclobenzaprine (FLEXERIL) 10 MG tablet Take 2 tablets (20 mg total) by mouth at bedtime.  . divalproex (DEPAKOTE ER) 500 MG 24 hr tablet TAKE 2 TABLETS(1000 MG) BY MOUTH AT BEDTIME  . doxazosin (CARDURA) 4 MG tablet Take 1 tablet (4 mg total) by mouth at bedtime.  . gabapentin (NEURONTIN) 400 MG capsule 1 twice daily and 2 in the evening  . haloperidol (HALDOL) 2 MG tablet Take 1 tablet (2 mg total) by mouth at bedtime. (Patient taking differently: Take 1 mg by mouth at bedtime. )  . lamoTRIgine (LAMICTAL) 200 MG tablet TAKE 1 AND 1/2 TABLETS(300 MG) BY MOUTH DAILY  . LORazepam (ATIVAN) 1 MG tablet TAKE 1 TABLET BY MOUTH FOUR TIMES DAILY  . rOPINIRole (REQUIP) 4 MG tablet Take 2 tablets (8 mg total) by mouth at bedtime.  . sertraline (ZOLOFT) 100 MG tablet TAKE  ONE TABLET BY MOUTH DAILY  . valACYclovir (VALTREX) 500 MG tablet Take one tablet by mouth twice daily for 3-5 days then daily as needed.  . zolpidem (AMBIEN) 10 MG tablet TAKE 1/2 TO 1 TABLET BY MOUTH EVERY NIGHT AT BEDTIME AS NEEDED FOR SLEEP   No  facility-administered encounter medications on file as of 06/15/2019.     Allergies: No Known Allergies  Family History: Family History  Problem Relation Age of Onset  . Thyroid disease Mother   . COPD Mother   . Bipolar disorder Mother   . Parkinson's disease Father   . Breast cancer Paternal Grandmother 1  . Bipolar disorder Sister   . Schizophrenia Other     Social History: Social History   Socioeconomic History  . Marital status: Married    Spouse name: Not on file  . Number of children: Not on file  . Years of education: Not on file  . Highest education level: Not on file  Occupational History  . Not on file  Social Needs  . Financial resource strain: Not on file  . Food insecurity    Worry: Not on file    Inability: Not on file  . Transportation needs    Medical: Not on file    Non-medical: Not on file  Tobacco Use  . Smoking status: Former Research scientist (life sciences)  . Smokeless tobacco: Never Used  Substance and Sexual Activity  . Alcohol use: No  . Drug use: No  . Sexual activity: Yes    Birth control/protection: None    Comment: 1st intercourse 26 yo-5 partners  Lifestyle  . Physical activity    Days per week: Not on file    Minutes per session: Not on file  . Stress: Not on file  Relationships  . Social Herbalist on phone: Not on file    Gets together: Not on file    Attends religious service: Not on file    Active member of club or organization: Not on file    Attends meetings of clubs or organizations: Not on file    Relationship status: Not on file  . Intimate partner violence    Fear of current or ex partner: Not on file    Emotionally abused: Not on file    Physically abused: Not on file    Forced sexual activity: Not on file  Other Topics Concern  . 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    Observations/Objective:   Height 5\' 7"  (1.702 m), weight 155 lb (70.3 kg). No acute distress.  Alert and oriented.  Speech fluent and not dysarthric.  Language intact.  Face symmetric.  Assessment and Plan:   Restless leg syndrome.  1.  She will discontinue Sinemet CR 2.  Continue gabapentin, ropirole and iron supplement 3.  Follow up in 6 months.  Follow Up Instructions:    -I discussed the assessment and treatment plan with the patient. The patient was provided an opportunity to ask questions and all were answered. The patient agreed with the plan and demonstrated an understanding of the instructions.   The patient was advised to call back or seek an in-person evaluation if the symptoms worsen or if the condition fails to improve as anticipated.    Total Time  spent in visit with the patient was:  8 minutes   Dudley Major, DO

## 2019-06-15 ENCOUNTER — Encounter: Payer: Self-pay | Admitting: Neurology

## 2019-06-15 ENCOUNTER — Telehealth (INDEPENDENT_AMBULATORY_CARE_PROVIDER_SITE_OTHER): Payer: Medicare HMO | Admitting: Neurology

## 2019-06-15 ENCOUNTER — Other Ambulatory Visit: Payer: Self-pay

## 2019-06-15 ENCOUNTER — Ambulatory Visit: Payer: Medicare HMO | Admitting: Neurology

## 2019-06-15 VITALS — Ht 67.0 in | Wt 155.0 lb

## 2019-06-15 DIAGNOSIS — G2581 Restless legs syndrome: Secondary | ICD-10-CM

## 2019-06-16 ENCOUNTER — Other Ambulatory Visit: Payer: Self-pay

## 2019-06-16 MED ORDER — ZOLPIDEM TARTRATE 10 MG PO TABS: ORAL_TABLET | ORAL | 2 refills | Status: DC

## 2019-06-16 MED ORDER — LORAZEPAM 1 MG PO TABS: ORAL_TABLET | ORAL | 2 refills | Status: DC

## 2019-07-01 ENCOUNTER — Telehealth: Payer: Self-pay | Admitting: Psychiatry

## 2019-07-01 ENCOUNTER — Other Ambulatory Visit: Payer: Self-pay | Admitting: Psychiatry

## 2019-07-01 NOTE — Telephone Encounter (Signed)
Patient left vm today @9 :03 stated that she is not sleeping, wakes up several times during the night, she mentioned about taking Ambien CR but at the time insurance did not cover so she wants to know if she can get the Ambien CR through [facility] at [facility].

## 2019-07-01 NOTE — Telephone Encounter (Signed)
Pt picked up zolpidem #30 on 06/16/19.  Bring bottle Monday to office for [place] to verify not overtaking and we will agree to zolpidem CR trial then.  Otherwise will have to wait until zolpidem refill is due in August.

## 2019-07-01 NOTE — Telephone Encounter (Signed)
Left VM for her to return call.

## 2019-07-04 NOTE — Telephone Encounter (Signed)
Left Vm to return my call.

## 2019-07-05 NOTE — Telephone Encounter (Signed)
Pt. Made aware and verbalized understanding. She is not sure at the moment which option she will take since the month is almost over. She will let us know.

## 2019-07-18 ENCOUNTER — Other Ambulatory Visit: Payer: Self-pay

## 2019-07-18 ENCOUNTER — Telehealth: Payer: Self-pay | Admitting: Psychiatry

## 2019-07-18 MED ORDER — ZOLPIDEM TARTRATE ER 12.5 MG PO TBCR: 12.5000 mg | EXTENDED_RELEASE_TABLET | Freq: Every evening | ORAL | 0 refills | Status: DC | PRN

## 2019-07-18 NOTE — Telephone Encounter (Signed)
Will pend for approval

## 2019-07-18 NOTE — Telephone Encounter (Signed)
Please see previous note. Patient called and said that she needs a refill on ambien cr to be filled at the [facility] on [address].

## 2019-07-19 ENCOUNTER — Encounter: Payer: Medicare HMO | Admitting: Gynecology

## 2019-07-29 ENCOUNTER — Other Ambulatory Visit: Payer: Self-pay | Admitting: Psychiatry

## 2019-07-29 DIAGNOSIS — F5101 Primary insomnia: Secondary | ICD-10-CM

## 2019-07-29 DIAGNOSIS — F4310 Post-traumatic stress disorder, unspecified: Secondary | ICD-10-CM

## 2019-08-09 ENCOUNTER — Ambulatory Visit (INDEPENDENT_AMBULATORY_CARE_PROVIDER_SITE_OTHER): Payer: Medicare HMO | Admitting: Psychiatry

## 2019-08-09 ENCOUNTER — Other Ambulatory Visit: Payer: Self-pay

## 2019-08-09 ENCOUNTER — Encounter: Payer: Self-pay | Admitting: Psychiatry

## 2019-08-09 DIAGNOSIS — F411 Generalized anxiety disorder: Secondary | ICD-10-CM

## 2019-08-09 DIAGNOSIS — F5105 Insomnia due to other mental disorder: Secondary | ICD-10-CM

## 2019-08-09 DIAGNOSIS — F5101 Primary insomnia: Secondary | ICD-10-CM

## 2019-08-09 DIAGNOSIS — F99 Mental disorder, not otherwise specified: Secondary | ICD-10-CM

## 2019-08-09 DIAGNOSIS — G2581 Restless legs syndrome: Secondary | ICD-10-CM

## 2019-08-09 DIAGNOSIS — F315 Bipolar disorder, current episode depressed, severe, with psychotic features: Secondary | ICD-10-CM | POA: Diagnosis not present

## 2019-08-09 DIAGNOSIS — F4310 Post-traumatic stress disorder, unspecified: Secondary | ICD-10-CM

## 2019-08-09 MED ORDER — ZOLPIDEM TARTRATE 10 MG PO TABS: 10.0000 mg | ORAL_TABLET | Freq: Every evening | ORAL | 0 refills | Status: DC | PRN

## 2019-08-09 NOTE — Patient Instructions (Addendum)
  Stop caffeine after noon.  Stop Ambien CR and start Regular Ambien  Depakote 1 in the morning and 1 at noon.

## 2019-08-09 NOTE — Progress Notes (Signed)
Beth [name] [id number] [birth year] 43 y.o.    Subjective:   Patient ID:  Beth [name] is a 43 y.o. (DOB [birth year]) female.  Chief Complaint:  Chief Complaint  Patient presents with  . Follow-up    Medication Management  . Anxiety    Medication Management  . Other    Bipolar  . Medication Problem    nausea  . Sleeping Problem  Anxiety Symptoms include decreased concentration and nervous/anxious behavior. Patient reports no confusion or suicidal ideas.    Depression        Associated symptoms include decreased concentration.  Associated symptoms include no headaches and no suicidal ideas.  Past medical history includes anxiety.    Beth [name] presents to the office today for follow-up of TRD bipolar unstable which is difficult to treat due to med sensitivity and sleep problems.    Last seen June 07, 2019.  No major medication changes.  Thinks Depakote makes her sick at night.  Nausea lasts 1-2 hours but does not affect her in the morning dose.  Split the dose.  Hard time sleeping.  Ambien CR not working out.  Can't fall asleep.  Then sleeps 4 hours.  May nap an hour.  Is drowsy during day.  NM not often.  RLS resolved.  Reads a lot.  Exercises.    Disability review approved.  Overall still gets NV only at night after meds.  Doesn't like Haldol, no benefit and gets RLS with it.  No mood swings.  Pt reports that mood is Anxious, Depressed and both moderate.  Anxiety symptoms include: Excessive Worry,. Pt reports sleep affecting by NV when goes to bed and 5-6 hours now. Can't nap DT RLS Pt reports that appetite is good. Exercise and losing weight.  Pt reports that energy is improved and improved. Concentration is improved. Suicidal thoughts:  denied by patient.  Can get gabapentin through [facility] but insurance wouldn't cover.   It helped with anxiety and some with RLS on 400 mg 1 BID and 2 HS..  Tolerated it well.    Voices not as bad.  Month of 2023/06/19 is hard bc Dad died 4 years  ago and father's day and his birthday is this month.  Problem with Depakote causing vomiting at night.  Going on for awhile.    On Sinemet 3 mos per neuro and sees him next month.  Reduced caffeine to 1 coffee am still some in the afternoon "more than I should".  Coffee and couple mountain DEW.  Meds for RLS changed by doctor and it's better.No dizziness with doxazosin.  NM 4 times weekly reduced to 1 weekly.. Taking ambien 10 and doxazosin.  Doxazosin helped NM.  Had NM of F every night and would interfere.  Tired of dreaming of him.  They stopped..   Saphris helped her go to sleep.  Before it couldn't get to sleep.  Now average 5 hours sleep.  No naps.  No RLS right now with ropinirole better than pramipexole.  Insurance change demand she stop Saphris, Latuda, loxapine is $100/month.  She looked up what she can afford risperidone, Geodon, haloperidol, Tegretol  She can't afford Vraylar.  Current stressors $, fear of losing disability. Expects to hear about disability soon but hasn't yet.  Hallucinations-hears her F and then a girl especially at night.   Mind won't shut off at night.  Negative things that she's not good enough, the world would be better without her.  Not suicidal.  Voices usually  once daily, no particular time of day.  Shuts down when hears the voices.  Had good realtionship with father who died 3 years ago.  Remote SI but did not hurt herself, was hospitalized.  Prior psychiatric medication trials include Latuda  restless legs, Fanapt which cause restless legs, Loxapine weight, Abilfiy 10, Saphris which caused weight gain and $, olanzapine weight gain, Geodon, Seroquel weight gain and restless legs, risperidone restless legs, Lyrica weight gain, lithium side effects, lamotrigine, Equetro with mildly elevated liver enzymes, Lexapro, duloxetine, venlafaxine, bupropion, fluoxetine, doxepin ineffective, Restoril lost effect, sertraline, Ambien, mirtazapine increased  appetite, trazodone restlessness, loxapine cost and weight, Fanapt samples, gabapentin.  Haloperidol 2 RLS Latuda helped mood but can't afford.  Liked saphris some.  Review of Systems:  Review of Systems  Neurological: Negative for tremors, weakness and headaches.       RLS controlled   Psychiatric/Behavioral: Positive for decreased concentration, depression, dysphoric mood, hallucinations and sleep disturbance. Negative for agitation, behavioral problems, confusion, self-injury and suicidal ideas. The patient is nervous/anxious. The patient is not hyperactive.     Medications: I have reviewed the patient's current medications.  Current Outpatient Medications  Medication Sig Dispense Refill  . cyclobenzaprine (FLEXERIL) 10 MG tablet TAKE 2 TABLETS(20 MG) BY MOUTH AT BEDTIME 60 tablet 3  . cyclobenzaprine (FLEXERIL) 10 MG tablet Take 2 tablets (20 mg total) by mouth at bedtime. 60 tablet 3  . divalproex (DEPAKOTE ER) 500 MG 24 hr tablet TAKE 2 TABLETS(1000 MG) BY MOUTH AT BEDTIME 180 tablet 1  . doxazosin (CARDURA) 4 MG tablet TAKE 1 TABLET(4 MG) BY MOUTH AT BEDTIME 90 tablet 0  . gabapentin (NEURONTIN) 400 MG capsule 1 twice daily and 2 in the evening 120 capsule 2  . lamoTRIgine (LAMICTAL) 200 MG tablet TAKE 1 AND 1/2 TABLETS(300 MG) BY MOUTH DAILY 135 tablet 0  . LORazepam (ATIVAN) 1 MG tablet TAKE 1 TABLET BY MOUTH FOUR TIMES DAILY 120 tablet 2  . rOPINIRole (REQUIP) 4 MG tablet Take 2 tablets (8 mg total) by mouth at bedtime. 60 tablet 5  . sertraline (ZOLOFT) 100 MG tablet TAKE ONE TABLET BY MOUTH DAILY 90 tablet 0  . valACYclovir (VALTREX) 500 MG tablet Take one tablet by mouth twice daily for 3-5 days then daily as needed. 90 tablet 4   No current facility-administered medications for this visit.     Medication Side Effects: Other: RLS  Allergies: No Known Allergies  Past Medical History:  Diagnosis Date  . Bipolar disorder ([place])   . HSV (herpes simplex virus) anogenital  infection 05/2018  . RLS (restless legs syndrome)     Family History  Problem Relation Age of Onset  . Thyroid disease Mother   . COPD Mother   . Bipolar disorder Mother   . Parkinson's disease Father   . Breast cancer Paternal Grandmother 29  . Bipolar disorder Sister   . Schizophrenia Other     Social History   Socioeconomic History  . Marital status: Married    Spouse name: Not on file  . Number of children: Not on file  . Years of education: Not on file  . Highest education level: Associate degree: academic program  Occupational History  . Occupation: unemployed  Social Needs  . Financial resource strain: Not on file  . Food insecurity    Worry: Not on file    Inability: Not on file  . Transportation needs    Medical: Not on file    Non-medical: Not  on file  Tobacco Use  . Smoking status: Former Research scientist (life sciences)  . Smokeless tobacco: Never Used  Substance and Sexual Activity  . Alcohol use: No  . Drug use: No  . Sexual activity: Yes    Birth control/protection: None    Comment: 1st intercourse 38 yo-5 partners  Lifestyle  . Physical activity    Days per week: Not on file    Minutes per session: Not on file  . Stress: Not on file  Relationships  . Social Herbalist on phone: Not on file    Gets together: Not on file    Attends religious service: Not on file    Active member of club or organization: Not on file    Attends meetings of clubs or organizations: Not on file    Relationship status: Not on file  . Intimate partner violence    Fear of current or ex partner: Not on file    Emotionally abused: Not on file    Physically abused: Not on file    Forced sexual activity: Not on file  Other Topics Concern  . 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     Past Medical History, Surgical history,  Social history, and Family history were reviewed and updated as appropriate.   Please see review of systems for further details on the patient's review from today.   Objective:   Physical Exam:  There were no vitals taken for this visit.  Physical Exam Constitutional:      Appearance: Normal appearance.  Neurological:     Mental Status: She is alert.     Motor: No tremor.     Gait: Gait normal.  Psychiatric:        Attention and Perception: She is attentive. She does not perceive auditory hallucinations.        Mood and Affect: Mood is anxious and depressed.        Speech: Speech normal. Speech is not rapid and pressured.        Behavior: Behavior normal. Behavior is cooperative.        Thought Content: Thought content is paranoid. Thought content does not include homicidal or suicidal ideation.        Cognition and Memory: Cognition normal.     Comments: Insight and judgment fair. Paranoid a long time that someone is out to get her Effectively and objectively she does not appear as distressed as she describes.  This observation is chronic.     Lab Review:     Component Value Date/Time   NA 137 07/12/2018 0859   K 4.3 07/12/2018 0859   CL 103 07/12/2018 0859   CO2 27 07/12/2018 0859   GLUCOSE 94 07/12/2018 0859   BUN 11 07/12/2018 0859   CREATININE 0.69 07/12/2018 0859   CALCIUM 9.4 07/12/2018 0859   PROT 7.5 09/02/2018 1058   ALBUMIN 4.7 03/20/2017 1100   AST 19 09/02/2018 1058   ALT 13 09/02/2018 1058   ALKPHOS 65 03/20/2017 1100   BILITOT 0.6 09/02/2018 1058       Component Value Date/Time   WBC 6.3 07/12/2018 0859   RBC 4.39 07/12/2018 0859   HGB 14.0 07/12/2018 0859   HCT 40.4 07/12/2018 0859   PLT 336 07/12/2018 0859  MCV 92.0 07/12/2018 0859   MCH 31.9 07/12/2018 0859   MCHC 34.7 07/12/2018 0859   RDW 12.8 07/12/2018 0859   LYMPHSABS 1,884 07/12/2018 0859   MONOABS 570 03/20/2017 1100   EOSABS 239 07/12/2018 0859   BASOSABS 88 07/12/2018 0859     No results found for: POCLITH, LITHIUM   No results found for: PHENYTOIN, PHENOBARB, VALPROATE, CBMZ   .res Assessment: Plan:    Beth [name] was seen today for follow-up, anxiety, other, medication problem and sleeping problem.  Diagnoses and all orders for this visit:  Bipolar disorder, current episode depressed, severe, with psychotic features ([place])  Generalized anxiety disorder  PTSD (post-traumatic stress disorder)  Insomnia due to other mental disorder  Primary insomnia  Restless legs syndrome  Greater than 50% of face to face time with patient was spent on counseling and coordination of care. We discussed the following: Eri has treatment resistant bipolar disorder with psychotic features versus schizoaffective disorder and other psychiatric diagnoses as well.  She has been very difficult to treat in part because she is very sensitive to antipsychotics and yet has chronic auditory hallucinations.  She is chronically highly symptomatic with multiple diagnoses.  She is chronically anxious and depressed but the level of depression is not severe at this time. Extensive history of psychiatric meds tried discussed with the patient.   Her chief complaints at this visit are insomnia and anxiety.  As well as nausea after taking the evening dose of Depakote.  Her level of depression is tolerable at this time.  Since the last visit her auditory hallucinations have stopped.  But we can still consider if needed, Clozapine option.  Discussed this in detail including the risk of aplastic anemia and weight gain.  Option retry CBZ and follow liver enzymes as they were only mildly elevated.Disc DDI complications of CBZ and would need to check liver enzymes DT past history of mild elevations.  There are a lot of medication interactions.  This is the only option that won't worsen RLS except clozapine.  All the insurance preferred antipsychotics she listed are highly likely to worsen RLS based on her  sensitivity.  Fortunately her auditory hallucinations have stopped since her last visit.  Option increase lamotrigine but DDI with Depakote and already at decent dose. Split Depakote to 500 ER BID and get it away from HS to see if it stop the N/V.  She really didn't do that as requested last visit.  Take it AM and Noon.  She agrees.  NV may be coming from Sinemet with ropinirole.  Doxazosin is helping NM.  Discussed the risk of morning dizziness.  Stop caffeine after noon.  She did not comply with this direction at the last visit.  It was explained in detail that this could be the entire cause of her insomnia but at least is likely a major contributor.  She feels that Ambien CR is not helpful as it is harder to go to sleep.  She is also not staying asleep any better.  She wants to switch back to regular Ambien.  We discussed the risk of 10 mg in a woman causing amnesia.  She denies having any amnesia.  Also discussed the importance of not combining it with benzodiazepines which further increases the amnesia risk. DC Ambien CR Zolpidem 10 mg 1 nightly  continue Gabapentin for off label anxiety and RLS at previous dosage.  400 BID and 800 pm.    The prognosis for improvement is very guarded from  the use of medication.  She is very prone to EPS or restless legs from medications.  Her insurance will not pay for branded medications which tend to be better for depression and that is her chief complaint.    Follow-up in a couple of months.  Call if symptoms get worse  35-minute appointment  Lynder Parents MD, DFAPA  Please see After Visit Summary for patient specific instructions.  Future Appointments  Date Time Provider [place]  08/25/2019 11:00 AM Fontaine, Belinda Block, MD [facility]  12/19/2019  1:30 PM Pieter Partridge, DO [facility] None    No orders of the defined types were placed in this encounter.     -------------------------------

## 2019-08-24 ENCOUNTER — Other Ambulatory Visit: Payer: Self-pay

## 2019-08-25 ENCOUNTER — Ambulatory Visit (INDEPENDENT_AMBULATORY_CARE_PROVIDER_SITE_OTHER): Payer: Medicare HMO | Admitting: Gynecology

## 2019-08-25 ENCOUNTER — Encounter: Payer: Self-pay | Admitting: Gynecology

## 2019-08-25 VITALS — BP 120/70 | Ht 66.0 in | Wt 145.8 lb

## 2019-08-25 DIAGNOSIS — Z13220 Encounter for screening for lipoid disorders: Secondary | ICD-10-CM

## 2019-08-25 DIAGNOSIS — Z9289 Personal history of other medical treatment: Secondary | ICD-10-CM

## 2019-08-25 DIAGNOSIS — Z01419 Encounter for gynecological examination (general) (routine) without abnormal findings: Secondary | ICD-10-CM

## 2019-08-25 NOTE — Progress Notes (Signed)
    Beth [name] January 08, 1976 [id number]        42 y.o.  G0P0000 for annual gynecologic exam.  Without gynecologic complaints.    Past medical history,surgical history, problem list, medications, allergies, family history and social history were all reviewed and documented as reviewed in the EPIC chart.  ROS:  Performed with pertinent positives and negatives included in the history, assessment and plan.   Additional significant findings : None   Exam: Copywriter, advertising Vitals:   08/25/19 1057  BP: 120/70  Weight: 145 lb 12.8 oz (66.1 kg)  Height: 5\' 6"  (1.676 m)   Body mass index is 23.53 kg/m.  General appearance:  Normal affect, orientation and appearance. Skin: Grossly normal HEENT: Without gross lesions.  No cervical or supraclavicular adenopathy. Thyroid normal.  Lungs:  Clear without wheezing, rales or rhonchi Cardiac: RR, without RMG Abdominal:  Soft, nontender, without masses, guarding, rebound, organomegaly or hernia Breasts:  Examined lying and sitting without masses, retractions, discharge or axillary adenopathy. Pelvic:  Ext, BUS, Vagina: Normal  Cervix: Normal  Uterus: Anteverted, normal size, shape and contour, midline and mobile nontender   Adnexa: Without masses or tenderness    Anus and perineum: Normal   Rectovaginal: Normal sphincter tone without palpated masses or tenderness.    Assessment/Plan:  43 y.o. G0P0000 female for annual gynecologic exam.  With regular menses, no contraception  1. No contraception.  Would except pregnancy if it occurred. 2. Mammography coming due and patient will call to schedule.  Breast exam normal today. 3. Pap smear/HPV 2018.  No Pap smear done today.  No history of abnormal Pap smears.  Plan repeat Pap smear/HPV at 5-year interval per current screening guidelines. 4. History of HSV.  No recent outbreaks.  Has supply of Valtrex and uses intermittently as needed.  Will call when needs more. 5. Health maintenance.  Baseline  CBC, CMP and lipid profile ordered.  Follow-up 1 year, sooner as needed.   Anastasio Auerbach MD, 11:12 AM 08/25/2019

## 2019-08-25 NOTE — Patient Instructions (Addendum)
Schedule your mammogram at the breast center.  Follow-up in 1 year for annual exam, sooner as needed

## 2019-08-26 LAB — CBC WITH DIFFERENTIAL/PLATELET
Absolute Monocytes: 485 cells/uL (ref 200–950)
Basophils Absolute: 101 cells/uL (ref 0–200)
Basophils Relative: 1.6 %
Eosinophils Absolute: 435 cells/uL (ref 15–500)
Eosinophils Relative: 6.9 %
HCT: 41 % (ref 35.0–45.0)
Hemoglobin: 13.8 g/dL (ref 11.7–15.5)
Lymphs Abs: 2281 cells/uL (ref 850–3900)
MCH: 31.9 pg (ref 27.0–33.0)
MCHC: 33.7 g/dL (ref 32.0–36.0)
MCV: 94.9 fL (ref 80.0–100.0)
MPV: 11 fL (ref 7.5–12.5)
Monocytes Relative: 7.7 %
Neutro Abs: 2999 cells/uL (ref 1500–7800)
Neutrophils Relative %: 47.6 %
Platelets: 312 10*3/uL (ref 140–400)
RBC: 4.32 10*6/uL (ref 3.80–5.10)
RDW: 11.8 % (ref 11.0–15.0)
Total Lymphocyte: 36.2 %
WBC: 6.3 10*3/uL (ref 3.8–10.8)

## 2019-08-26 LAB — COMPREHENSIVE METABOLIC PANEL
AG Ratio: 2.4 (calc) (ref 1.0–2.5)
ALT: 13 U/L (ref 6–29)
AST: 15 U/L (ref 10–30)
Albumin: 4.7 g/dL (ref 3.6–5.1)
Alkaline phosphatase (APISO): 82 U/L (ref 31–125)
BUN: 10 mg/dL (ref 7–25)
CO2: 22 mmol/L (ref 20–32)
Calcium: 9.6 mg/dL (ref 8.6–10.2)
Chloride: 106 mmol/L (ref 98–110)
Creat: 0.81 mg/dL (ref 0.50–1.10)
Globulin: 2 g/dL (calc) (ref 1.9–3.7)
Glucose, Bld: 98 mg/dL (ref 65–99)
Potassium: 3.9 mmol/L (ref 3.5–5.3)
Sodium: 138 mmol/L (ref 135–146)
Total Bilirubin: 0.4 mg/dL (ref 0.2–1.2)
Total Protein: 6.7 g/dL (ref 6.1–8.1)

## 2019-08-26 LAB — LIPID PANEL
Cholesterol: 176 mg/dL (ref <200)
HDL: 59 mg/dL (ref >=50)
LDL Cholesterol (Calc): 102 mg/dL (calc) — ABNORMAL HIGH
Non-HDL Cholesterol (Calc): 117 mg/dL (calc) (ref <130)
Total CHOL/HDL Ratio: 3 (calc) (ref <5.0)
Triglycerides: 67 mg/dL (ref <150)

## 2019-09-06 ENCOUNTER — Encounter: Payer: Self-pay | Admitting: Gynecology

## 2019-09-09 ENCOUNTER — Other Ambulatory Visit: Payer: Self-pay

## 2019-09-09 MED ORDER — LORAZEPAM 1 MG PO TABS: ORAL_TABLET | ORAL | 2 refills | Status: DC

## 2019-09-16 ENCOUNTER — Telehealth: Payer: Self-pay | Admitting: Psychiatry

## 2019-09-16 ENCOUNTER — Other Ambulatory Visit: Payer: Self-pay | Admitting: Psychiatry

## 2019-09-16 DIAGNOSIS — F315 Bipolar disorder, current episode depressed, severe, with psychotic features: Secondary | ICD-10-CM

## 2019-09-16 MED ORDER — GABAPENTIN 400 MG PO CAPS: ORAL_CAPSULE | ORAL | 0 refills | Status: DC

## 2019-09-16 MED ORDER — LAMOTRIGINE 200 MG PO TABS: ORAL_TABLET | ORAL | 0 refills | Status: DC

## 2019-09-16 NOTE — Telephone Encounter (Signed)
Done

## 2019-09-16 NOTE — Telephone Encounter (Signed)
Patient called and left a message stating that she needs a refill on her gabapentin and lamictal sent to the [facility] on [address]. Her next appt is in November 23

## 2019-09-21 ENCOUNTER — Other Ambulatory Visit: Payer: Self-pay | Admitting: Psychiatry

## 2019-09-21 ENCOUNTER — Other Ambulatory Visit: Payer: Self-pay | Admitting: Neurology

## 2019-09-21 DIAGNOSIS — F5101 Primary insomnia: Secondary | ICD-10-CM

## 2019-09-21 DIAGNOSIS — F4310 Post-traumatic stress disorder, unspecified: Secondary | ICD-10-CM

## 2019-10-07 ENCOUNTER — Other Ambulatory Visit: Payer: Self-pay | Admitting: Neurology

## 2019-10-20 ENCOUNTER — Telehealth: Payer: Self-pay

## 2019-10-20 MED ORDER — VALACYCLOVIR HCL 500 MG PO TABS: ORAL_TABLET | ORAL | 4 refills | Status: DC

## 2019-10-20 NOTE — Telephone Encounter (Signed)
Needs refill on Valtrex but pharmacy has changed.

## 2019-11-07 ENCOUNTER — Encounter: Payer: Self-pay | Admitting: Psychiatry

## 2019-11-07 ENCOUNTER — Ambulatory Visit (INDEPENDENT_AMBULATORY_CARE_PROVIDER_SITE_OTHER): Payer: Medicare HMO | Admitting: Psychiatry

## 2019-11-07 ENCOUNTER — Other Ambulatory Visit: Payer: Self-pay

## 2019-11-07 DIAGNOSIS — F99 Mental disorder, not otherwise specified: Secondary | ICD-10-CM

## 2019-11-07 DIAGNOSIS — F5105 Insomnia due to other mental disorder: Secondary | ICD-10-CM

## 2019-11-07 DIAGNOSIS — F411 Generalized anxiety disorder: Secondary | ICD-10-CM | POA: Diagnosis not present

## 2019-11-07 DIAGNOSIS — G2581 Restless legs syndrome: Secondary | ICD-10-CM

## 2019-11-07 DIAGNOSIS — F315 Bipolar disorder, current episode depressed, severe, with psychotic features: Secondary | ICD-10-CM | POA: Diagnosis not present

## 2019-11-07 DIAGNOSIS — F4310 Post-traumatic stress disorder, unspecified: Secondary | ICD-10-CM | POA: Diagnosis not present

## 2019-11-07 NOTE — Progress Notes (Signed)
Beth [name] [id number] 17-Oct-1976 43 y.o.    Subjective:   Patient ID:  Beth [name] is a 43 y.o. (DOB 1976-05-25) female.  Chief Complaint:  Chief Complaint  Patient presents with  . Follow-up    Medication Management  . Anxiety    Medication Management  . Other    Bipolar disorder, current episode depressed, severe, with psychotic features   Anxiety Symptoms include decreased concentration and nervous/anxious behavior. Patient reports no confusion, nausea or suicidal ideas.    Depression        Associated symptoms include decreased concentration.  Associated symptoms include no headaches and no suicidal ideas.  Past medical history includes anxiety.    Beth [name] presents to the office today for follow-up of TRD bipolar unstable which is difficult to treat due to med sensitivity and sleep problems.   Primary CC is initial insomnia DT mind races.  Last seen August 25 , 2020.  Encouraged her to split the dose of Depakote ER 500 mg twice daily and get it away from bedtime to see if nausea and vomiting will improve.  She was encouraged to stop caffeine after noon because of insomnia and we switched from Ambien CR to regular Ambien 10 mg.  Doing OK overall.    When she Split the dose of Depakote solved the NV problems.  Hard time sleeping. Ambien helps her fall asleep and then sleeps 5 hour and sometimes naps..  NM not often.  RLS resolved.  Reads a lot.  Exercises.   Limited caffeine and none afternoon  Disability review approved.  Overall still gets NV only at night after meds.  No mood swings.  Pt reports that mood is depressed 3/10 but higher anxiety.  Several stressors.  Anxiety symptoms include: Excessive Worry,. Pt reports sleep affecting by mind racing randomly everynight when goes to bed and 5-6 hours now. Can't nap DT RLS Pt reports that appetite is good. Exercise and losing weight.  Pt reports that energy is improved and improved. Concentration is improved. Suicidal  thoughts:  denied by patient.  Can get gabapentin through [facility] but insurance wouldn't cover.   It helped with anxiety and some with RLS on 400 mg 1 BID and 2 HS..  Tolerated it well.  Getting better insurance soon.    Reads a lot and that helps.  Voices not as bad.  Month of Jul 04, 2023 is hard bc Dad died 4 years ago and father's day and his birthday is this month.  Problem with Depakote causing vomiting at night.  Going on for awhile.    On Sinemet 3 mos per neuro and sees him next month.  Reduced caffeine to 1 coffee am still some in the afternoon "more than I should".  Coffee and couple mountain DEW.  Meds for RLS changed by doctor and it's better.No dizziness with doxazosin.  Reducing caffeine helped RLS.  NM mostly controlled... Taking ambien 10 and doxazosin.  Doxazosin helped NM.  Had NM of F every night and would interfere.  Tired of dreaming of him.  They stopped..   Saphris helped her go to sleep.  Before it couldn't get to sleep.  Now average 5 hours sleep.  No naps.  No RLS right now with ropinirole better than pramipexole.  Insurance change demand she stop Saphris, Latuda, loxapine is $100/month.  She looked up what she can afford risperidone, Geodon, haloperidol, Tegretol  She can't afford Vraylar.  Current stressors $,  Had good realtionship with father who died  3 years ago.  Remote SI but did not hurt herself, was hospitalized.  Prior psychiatric medication trials include Latuda  restless legs, Fanapt which cause restless legs, Loxapine weight, Abilfiy 10, Saphris which caused weight gain and $, olanzapine weight gain, Geodon, Seroquel weight gain and restless legs, risperidone restless legs, Haloperidol 2 RLS, Liked saphris some.  loxapine cost and weight, Fanapt samples, Latuda helped mood but can't afford.  Lyrica weight gain, lithium side effects, lamotrigine, Equetro with mildly elevated liver enzymes, Lexapro, duloxetine, venlafaxine, bupropion, fluoxetine,  doxepin ineffective, Restoril lost effect, sertraline, Ambien, mirtazapine increased appetite, trazodone restlessness,, gabapentin.     Review of Systems:  Review of Systems  Gastrointestinal: Negative for nausea and vomiting.  Neurological: Negative for tremors, weakness and headaches.       RLS controlled   Psychiatric/Behavioral: Positive for decreased concentration, depression, dysphoric mood and sleep disturbance. Negative for agitation, behavioral problems, confusion, hallucinations, self-injury and suicidal ideas. The patient is nervous/anxious. The patient is not hyperactive.     Medications: I have reviewed the patient's current medications.  Current Outpatient Medications  Medication Sig Dispense Refill  . cyclobenzaprine (FLEXERIL) 10 MG tablet TAKE TWO TABLETS BY MOUTH AT BEDTIME 60 tablet 2  . divalproex (DEPAKOTE ER) 500 MG 24 hr tablet TAKE 2 TABLETS(1000 MG) BY MOUTH AT BEDTIME (Patient taking differently: Take 500 mg by mouth 2 (two) times daily. ) 180 tablet 1  . doxazosin (CARDURA) 4 MG tablet TAKE 1 TABLET(4 MG) BY MOUTH AT BEDTIME 90 tablet 0  . gabapentin (NEURONTIN) 400 MG capsule 1 twice daily and 2 in the evening 120 capsule 0  . lamoTRIgine (LAMICTAL) 200 MG tablet TAKE 1 AND 1/2 TABLETS(300 MG) BY MOUTH DAILY 45 tablet 0  . LORazepam (ATIVAN) 1 MG tablet TAKE 1 TABLET BY MOUTH FOUR TIMES DAILY 120 tablet 2  . rOPINIRole (REQUIP) 4 MG tablet TAKE 2 TABLETS(8 MG) BY MOUTH AT BEDTIME 60 tablet 5  . sertraline (ZOLOFT) 100 MG tablet TAKE ONE TABLET BY MOUTH DAILY 90 tablet 0  . valACYclovir (VALTREX) 500 MG tablet Take one tablet by mouth twice daily for 3-5 days then daily as needed. 90 tablet 4  . zolpidem (AMBIEN) 10 MG tablet Take 1 tablet (10 mg total) by mouth at bedtime as needed for sleep. 30 tablet 0   No current facility-administered medications for this visit.     Medication Side Effects: Other: RLS  Allergies: No Known Allergies  Past Medical  History:  Diagnosis Date  . Bipolar disorder ([place])   . HSV (herpes simplex virus) anogenital infection 05/2018  . RLS (restless legs syndrome)     Family History  Problem Relation Age of Onset  . Thyroid disease Mother   . COPD Mother   . Bipolar disorder Mother   . Parkinson's disease Father   . Breast cancer Paternal Grandmother 60  . Bipolar disorder Sister   . Schizophrenia Other     Social History   Socioeconomic History  . Marital status: Married    Spouse name: Not on file  . Number of children: Not on file  . Years of education: Not on file  . Highest education level: Associate degree: academic program  Occupational History  . Occupation: unemployed  Social Needs  . Financial resource strain: Not on file  . Food insecurity    Worry: Not on file    Inability: Not on file  . Transportation needs    Medical: Not on file  Non-medical: Not on file  Tobacco Use  . Smoking status: Former Research scientist (life sciences)  . Smokeless tobacco: Never Used  Substance and Sexual Activity  . Alcohol use: No  . Drug use: No  . Sexual activity: Yes    Birth control/protection: None    Comment: 1st intercourse 61 yo-5 partners  Lifestyle  . Physical activity    Days per week: Not on file    Minutes per session: Not on file  . Stress: Not on file  Relationships  . Social Herbalist on phone: Not on file    Gets together: Not on file    Attends religious service: Not on file    Active member of club or organization: Not on file    Attends meetings of clubs or organizations: Not on file    Relationship status: Not on file  . Intimate partner violence    Fear of current or ex partner: Not on file    Emotionally abused: Not on file    Physically abused: Not on file    Forced sexual activity: Not on file  Other Topics Concern  . 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     Past Medical History, Surgical history, Social history, and Family history were reviewed and updated as appropriate.   Please see review of systems for further details on the patient's review from today.   Objective:   Physical Exam:  There were no vitals taken for this visit.  Physical Exam Constitutional:      General: She is not in acute distress.    Appearance: Normal appearance. She is well-developed.  Musculoskeletal:        General: No deformity.  Neurological:     Mental Status: She is alert and oriented to person, place, and time.     Motor: No tremor.     Coordination: Coordination normal.     Gait: Gait normal.  Psychiatric:        Attention and Perception: Attention and perception normal. She is attentive. She does not perceive auditory or visual hallucinations.        Mood and Affect: Mood is anxious and depressed. Affect is not labile, blunt, angry or inappropriate.        Speech: Speech normal. Speech is not rapid and pressured.        Behavior: Behavior normal. Behavior is cooperative.        Thought Content: Thought content is paranoid. Thought content does not include homicidal or suicidal ideation. Thought content does not include homicidal or suicidal plan.        Cognition and Memory: Cognition and memory normal.        Judgment: Judgment normal.     Comments: Insight and judgment fair. Paranoid a long time that someone is out to get her Effectively and objectively she does not appear as distressed as she describes.  This observation is chronic.     Lab Review:     Component Value Date/Time   NA 138 08/25/2019 1114   K 3.9 08/25/2019 1114   CL 106 08/25/2019 1114   CO2 22 08/25/2019 1114   GLUCOSE 98 08/25/2019 1114   BUN 10 08/25/2019 1114   CREATININE 0.81 08/25/2019 1114   CALCIUM 9.6 08/25/2019 1114  PROT 6.7 08/25/2019 1114   ALBUMIN 4.7 03/20/2017 1100   AST 15 08/25/2019 1114   ALT 13  08/25/2019 1114   ALKPHOS 65 03/20/2017 1100   BILITOT 0.4 08/25/2019 1114       Component Value Date/Time   WBC 6.3 08/25/2019 1114   RBC 4.32 08/25/2019 1114   HGB 13.8 08/25/2019 1114   HCT 41.0 08/25/2019 1114   PLT 312 08/25/2019 1114   MCV 94.9 08/25/2019 1114   MCH 31.9 08/25/2019 1114   MCHC 33.7 08/25/2019 1114   RDW 11.8 08/25/2019 1114   LYMPHSABS 2,281 08/25/2019 1114   MONOABS 570 03/20/2017 1100   EOSABS 435 08/25/2019 1114   BASOSABS 101 08/25/2019 1114    No results found for: POCLITH, LITHIUM   No results found for: PHENYTOIN, PHENOBARB, VALPROATE, CBMZ   .res Assessment: Plan:    Beth [name] was seen today for follow-up, anxiety and other.  Diagnoses and all orders for this visit:  Bipolar disorder, current episode depressed, severe, with psychotic features ([place])  Generalized anxiety disorder  PTSD (post-traumatic stress disorder)  Insomnia due to other mental disorder  Restless legs syndrome     Greater than 50% of face to face time with patient was spent on counseling and coordination of care. We discussed the following: Jazabella has treatment resistant bipolar disorder with psychotic features versus schizoaffective disorder and other psychiatric diagnoses as well.  She has been very difficult to treat in part because she is very sensitive to antipsychotics and yet has chronic auditory hallucinations.    She is chronically anxious and depressed but the level of depression is not severe at this time with depression being mild and anxiety being moderate. Extensive history of psychiatric meds tried discussed with the patient.   Her chief complaints at this visit are insomnia.  Her level of depression is tolerable at this time.  Nausea and vomiting resolved by splitting the Depakote dosage.  RLS managed and neuro checked iron studies reportedly ok.  Since the last visit her auditory hallucinations have stopped.  But we can still consider if  needed, Clozapine option.  Discussed this in detail including the risk of aplastic anemia and weight gain.  Option retry CBZ and follow liver enzymes as they were only mildly elevated.Disc DDI complications of CBZ and would need to check liver enzymes DT past history of mild elevations.  There are a lot of medication interactions.  This is the only option that won't worsen RLS except clozapine.   Fortunately her auditory hallucinations have stopped since her last visit.  Doxazosin is helping NM.  Discussed the risk of morning dizziness.  Stop caffeine after noon.  She did not comply with this direction at the last visit.  It was explained in detail that this could be the entire cause of her insomnia but at least is likely a major contributor.  She has not been taking the Ambien correctly lately.  She takes it an hour ahead of bedtime and she be may be missing the window of effectiveness.  This was explained to her in detail and she says she will correct it..  Zolpidem 10 mg 1 nightly  Disc withdrawal from Ambien.  continue Gabapentin for off label anxiety and RLS at previous dosage.  400 BID and 800 pm.    Option Dayvigo, Belsomra if changing the timing of Ambien does not work she can try the samples with the Ambien if necessary.  She has chronic insomnia which is difficult to  manage and she will have rebound and insomnia if she attempts to stop the Ambien abruptly.  Discussed Ambien amnesia and side effects.  The prognosis for improvement is very guarded from the use of medication.  She is very prone to EPS or restless legs from medications.  Her insurance will not pay for branded medications which tend to be better for depression and that is her chief complaint.    Follow-up in a couple of months.  Call if symptoms get worse  Lynder Parents MD, DFAPA  Please see After Visit Summary for patient specific instructions.  Future Appointments  Date Time Provider [place]  12/19/2019  1:30  PM Metta Clines R, DO [id number] None    No orders of the defined types were placed in this encounter.     -------------------------------

## 2019-11-14 ENCOUNTER — Other Ambulatory Visit: Payer: Self-pay | Admitting: Gynecology

## 2019-11-14 DIAGNOSIS — Z1231 Encounter for screening mammogram for malignant neoplasm of breast: Secondary | ICD-10-CM

## 2019-11-18 ENCOUNTER — Telehealth: Payer: Self-pay | Admitting: Psychiatry

## 2019-11-18 ENCOUNTER — Other Ambulatory Visit: Payer: Self-pay | Admitting: Psychiatry

## 2019-11-18 DIAGNOSIS — F5101 Primary insomnia: Secondary | ICD-10-CM

## 2019-11-18 MED ORDER — ZOLPIDEM TARTRATE 10 MG PO TABS: 10.0000 mg | ORAL_TABLET | Freq: Every evening | ORAL | 2 refills | Status: DC | PRN

## 2019-11-18 NOTE — Telephone Encounter (Signed)
Sent RX

## 2019-11-18 NOTE — Telephone Encounter (Signed)
Patient called and left a message stating that the pharmacy sent over a refill request for her ambien. She had not heard anything from the pharmacy. Please send in the refill

## 2019-12-02 ENCOUNTER — Other Ambulatory Visit: Payer: Self-pay

## 2019-12-02 MED ORDER — LORAZEPAM 1 MG PO TABS: ORAL_TABLET | ORAL | 2 refills | Status: DC

## 2019-12-07 ENCOUNTER — Other Ambulatory Visit: Payer: Self-pay | Admitting: Psychiatry

## 2019-12-07 DIAGNOSIS — F315 Bipolar disorder, current episode depressed, severe, with psychotic features: Secondary | ICD-10-CM

## 2019-12-07 DIAGNOSIS — F411 Generalized anxiety disorder: Secondary | ICD-10-CM

## 2019-12-07 DIAGNOSIS — F4310 Post-traumatic stress disorder, unspecified: Secondary | ICD-10-CM

## 2019-12-12 ENCOUNTER — Telehealth: Payer: Self-pay | Admitting: Psychiatry

## 2019-12-12 NOTE — Telephone Encounter (Signed)
Pt called to say she is having a difficult time sleeping. She tried the sleep med samples that she was given at last appt, but it gave her restless leg. Needs to discuss this.

## 2019-12-13 ENCOUNTER — Encounter: Payer: Self-pay | Admitting: Neurology

## 2019-12-13 NOTE — Telephone Encounter (Signed)
If it is taking hours for her to fall asleep and the Ambien is no longer effective at all and it would be in her best interest to come off of the medication.  She has become tolerant to it and if she is off of it for 2 to 3 months it might work again.  I am not sure what else to give her for sleep.  I have given her or she is tried everything else that I know of.  That includes quetiapine, mirtazapine, doxepin, gabapentin, Belsomra, Dayvigo among others.  I assume she is tried hydroxyzine.  She needs to make sure she is not having any caffeine after noon.  She is already taking gabapentin 1 idea would be to increase that dosage by adding an extra capsule at night.  That medicine is slow to kick in and she would need to take it about 3 hours before she wants to get up.  Otherwise amount of ideas and will have to wait and discuss it at her next appointment.

## 2019-12-14 ENCOUNTER — Other Ambulatory Visit: Payer: Self-pay | Admitting: Psychiatry

## 2019-12-14 MED ORDER — HYDROXYZINE HCL 25 MG PO TABS: 25.0000 mg | ORAL_TABLET | Freq: Every evening | ORAL | 0 refills | Status: DC | PRN

## 2019-12-14 NOTE — Telephone Encounter (Signed)
Prescription sent for hydroxyzine

## 2019-12-14 NOTE — Telephone Encounter (Signed)
Left pt a detailed message and asked to call back with any questions.

## 2019-12-18 NOTE — Progress Notes (Signed)
Virtual Visit via Video Note The purpose of this virtual visit is to provide medical care while limiting exposure to the novel coronavirus.    Consent was obtained for video visit:  Yes Answered questions that patient had about telehealth interaction:  Yes I discussed the limitations, risks, security and privacy concerns of performing an evaluation and management service by telemedicine. I also discussed with the patient that there may be a patient responsible charge related to this service. The patient expressed understanding and agreed to proceed.  Pt location: Home Physician Location: office Name of referring provider:  Glendale Chard, MD I connected with Beth [name] at patients initiation/request on 12/19/2019 at  1:30 PM EST by video enabled telemedicine application and verified that I am speaking with the correct person using two identifiers. Pt MRN:  [id number] Pt DOB:  1976/02/23 Video Participants:  Beth [name]   History of Present Illness:  Beth [name] is a 44 year old woman with bipolar disorder, PTSD, generalized anxiety disorder and primary insomnia who follows up for restless leg syndrome.  UPDATE: Current medications: ropinirole 8mg  at bedtime; gabapentin 400mg  (9 AM)/400mg  (2 PM)/1200mg  (11 PM); Depakote ER 250mg  twice daily, lamotrigine 300mg  daily, doxazosin 2mg  at bedtime, eszopiclone 3mg  at bedtime, clonazepam 0.5mg  four times daily, sertraline 100mg  daily, ferrous sulfate 325mg , cyclobenzaprine 10mg  at bedtime; Ambien  RLS starts around 7 PM.  She takes her pills at around 11 PM.  She feels relief at around 11:30 PM.    HISTORY: She started experiencing restless leg symptoms when she was 44 years old after initiating medications for her bipolar disorder. Every night in bed, she notes painful needles sensation in the legs that causes her to move her legs around. However, moving the legs or walking around may only mildly help alleviate it. Comorbidities  include bipolar disorder with depression, generalized anxiety disorder, and primary insomnia.  Ferritin level was 54.5.  Past medications: Lyrica (weight gain),Mirapex 2mg  2-3 hours before bedtime,carbidopa-levodopa CR 25/100mg  at bedtime, iron supplement.  Her insurance would not cover [place]  Her father had Parkinson's disease.  Past Medical History: Past Medical History:  Diagnosis Date  . Bipolar disorder ([place])   . HSV (herpes simplex virus) anogenital infection 05/2018  . RLS (restless legs syndrome)     Medications: Outpatient Encounter Medications as of 12/19/2019  Medication Sig  . cyclobenzaprine (FLEXERIL) 10 MG tablet TAKE TWO TABLETS BY MOUTH AT BEDTIME  . divalproex (DEPAKOTE ER) 500 MG 24 hr tablet TAKE 2 TABLETS(1000 MG) BY MOUTH AT BEDTIME (Patient taking differently: Take 250 mg by mouth 2 (two) times daily. )  . doxazosin (CARDURA) 4 MG tablet TAKE 1 TABLET(4 MG) BY MOUTH AT BEDTIME  . gabapentin (NEURONTIN) 400 MG capsule 1 twice daily and 2 in the evening  . lamoTRIgine (LAMICTAL) 200 MG tablet TAKE 1 AND 1/2 TABLETS(300 MG) BY MOUTH DAILY  . LORazepam (ATIVAN) 1 MG tablet TAKE 1 TABLET BY MOUTH FOUR TIMES DAILY  . rOPINIRole (REQUIP) 4 MG tablet TAKE 2 TABLETS(8 MG) BY MOUTH AT BEDTIME  . sertraline (ZOLOFT) 100 MG tablet TAKE ONE TABLET BY MOUTH DAILY  . valACYclovir (VALTREX) 500 MG tablet Take one tablet by mouth twice daily for 3-5 days then daily as needed.  . zolpidem (AMBIEN) 10 MG tablet Take 1 tablet (10 mg total) by mouth at bedtime as needed for sleep.   No facility-administered encounter medications on file as of 12/19/2019.    Allergies: No Known Allergies  Family History: Family History  Problem Relation Age of Onset  . Thyroid disease Mother   . COPD Mother   . Bipolar disorder Mother   . Parkinson's disease Father   . Breast cancer Paternal Grandmother 65  . Bipolar disorder Sister   . Schizophrenia Other     Social History: Social  History   Socioeconomic History  . Marital status: Married    Spouse name: Not on file  . Number of children: 0  . Years of education: 77  . Highest education level: Associate degree: academic program  Occupational History  . Occupation: unemployed  Tobacco Use  . Smoking status: Former Research scientist (life sciences)  . Smokeless tobacco: Never Used  Substance and Sexual Activity  . Alcohol use: No  . Drug use: No  . Sexual activity: Yes    Birth control/protection: None    Comment: 1st intercourse 34 yo-5 partners  Other Topics Concern  . 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    Social Determinants of Health   Financial Resource Strain:   . Difficulty of Paying Living Expenses: Not on file  Food Insecurity:   . Worried About Charity fundraiser in the Last Year: Not on file  . Ran Out of Food in the Last Year: Not on file  Transportation Needs:   . Lack of Transportation (Medical): Not on file  . Lack of Transportation (Non-Medical): Not on file  Physical Activity:   . Days of Exercise per Week: Not on file  . Minutes of Exercise per Session: Not on file  Stress:   . Feeling of Stress : Not on file  Social Connections:   . Frequency of Communication with Friends and Family: Not on file  . Frequency of Social Gatherings with Friends and Family: Not on file  . Attends Religious Services: Not on file  . Active Member of Clubs or Organizations: Not on file  . Attends Archivist Meetings: Not on file  . Marital Status: Not on file  Intimate Partner Violence:   . Fear of Current or Ex-Partner: Not on file  . Emotionally Abused: Not on file  . Physically Abused: Not on file  . Sexually Abused: Not on file    Observations/Objective:   Height 5\' 7"  (1.702 m), weight 145 lb (65.8 kg). No acute distress.  Alert and oriented.   Speech fluent and not dysarthric.  Language intact.  Eyes orthophoric on primary gaze.  Face symmetric.  Assessment and Plan:   Restless leg syndrome  1.  Since restless leg symptoms are aggravated in the evening around 7 PM, will have her take ropinirole 2mg  at around 5 PM. 2.  Continue ropinirole 8mg  at bedtime, gabapentin 400mg /400mg /1200mg , ferrous sulfate 325mg  daily. 3.  Follow up in 4 months.  Follow Up Instructions:    -I discussed the assessment and treatment plan with the patient. The patient was provided an opportunity to ask questions and all were answered. The patient agreed with the plan and demonstrated an understanding of the instructions.   The patient was advised to call back or seek an in-person evaluation if the symptoms worsen or if the condition fails to improve as anticipated.    Total Time spent in visit with the patient was:  15 minutes.   Dudley Major, DO

## 2019-12-19 ENCOUNTER — Encounter: Payer: Self-pay | Admitting: Neurology

## 2019-12-19 ENCOUNTER — Telehealth (INDEPENDENT_AMBULATORY_CARE_PROVIDER_SITE_OTHER): Payer: Medicare HMO | Admitting: Neurology

## 2019-12-19 ENCOUNTER — Other Ambulatory Visit: Payer: Self-pay

## 2019-12-19 VITALS — Ht 67.0 in | Wt 145.0 lb

## 2019-12-19 DIAGNOSIS — G2581 Restless legs syndrome: Secondary | ICD-10-CM | POA: Diagnosis not present

## 2019-12-19 MED ORDER — ROPINIROLE HCL 2 MG PO TABS: ORAL_TABLET | ORAL | 5 refills | Status: DC

## 2019-12-20 ENCOUNTER — Other Ambulatory Visit: Payer: Self-pay | Admitting: Psychiatry

## 2019-12-20 DIAGNOSIS — F4310 Post-traumatic stress disorder, unspecified: Secondary | ICD-10-CM

## 2019-12-20 DIAGNOSIS — F315 Bipolar disorder, current episode depressed, severe, with psychotic features: Secondary | ICD-10-CM

## 2019-12-20 DIAGNOSIS — F5101 Primary insomnia: Secondary | ICD-10-CM

## 2020-01-04 ENCOUNTER — Ambulatory Visit: Payer: Medicare HMO

## 2020-01-06 ENCOUNTER — Other Ambulatory Visit: Payer: Self-pay | Admitting: Neurology

## 2020-01-18 ENCOUNTER — Ambulatory Visit (INDEPENDENT_AMBULATORY_CARE_PROVIDER_SITE_OTHER): Payer: Medicare HMO | Admitting: Psychiatry

## 2020-01-18 ENCOUNTER — Encounter: Payer: Self-pay | Admitting: Psychiatry

## 2020-01-18 DIAGNOSIS — F315 Bipolar disorder, current episode depressed, severe, with psychotic features: Secondary | ICD-10-CM | POA: Diagnosis not present

## 2020-01-18 DIAGNOSIS — F4310 Post-traumatic stress disorder, unspecified: Secondary | ICD-10-CM

## 2020-01-18 DIAGNOSIS — F411 Generalized anxiety disorder: Secondary | ICD-10-CM

## 2020-01-18 DIAGNOSIS — F5101 Primary insomnia: Secondary | ICD-10-CM

## 2020-01-18 DIAGNOSIS — G2581 Restless legs syndrome: Secondary | ICD-10-CM | POA: Diagnosis not present

## 2020-01-18 MED ORDER — ASENAPINE MALEATE 5 MG SL SUBL: 5.0000 mg | SUBLINGUAL_TABLET | Freq: Two times a day (BID) | SUBLINGUAL | 1 refills | Status: DC

## 2020-01-18 NOTE — Progress Notes (Signed)
Beth [name] [id number] 1976-02-25 44 y.o.    Subjective:   Patient ID:  Beth [name] is a 44 y.o. (DOB 05/17/1976) female.  Chief Complaint:  Chief Complaint  Patient presents with  . Follow-up    Medication Mangament  . Depression    Medication Mangament  . Anxiety    Medication Mangament  . Sleeping Problem  . Grief  Anxiety Symptoms include decreased concentration and nervous/anxious behavior. Patient reports no confusion, dizziness, nausea or suicidal ideas.    Depression        Associated symptoms include decreased concentration.  Associated symptoms include no headaches and no suicidal ideas.  Past medical history includes anxiety.    Breely [name] presents to the office today for follow-up of TRD bipolar unstable which is difficult to treat due to med sensitivity and sleep problems.    seen August 25 , 2020.  Encouraged her to split the dose of Depakote ER 500 mg twice daily and get it away from bedtime to see if nausea and vomiting will improve.  She was encouraged to stop caffeine after noon because of insomnia and we switched from Ambien CR to regular Ambien 10 mg. Doing OK overall.    Last seen November 08, 2019.  She was still having some issues with sleep but was drinking caffeine too late and she was encouraged to stop caffeine after noon.  No other meds were changed.  Not good.  Uncle living with them died 2023/04/04.  Had heart problems and apparent MI. Before that was relatively OK.  With new insurance can get generic Saphris for $24 .  It helped her sleep so well.  Doesn't remember how it worked for mood.  But wants to restart it for the sleep benefit and potential mood benefit.  Prior to his death mood swings were OK and anxiety manageable but it won't be that way now.  Has a lot more anxiety and depression.  Crying all the time.  Stressed over it.  He didn't have much  Life insurance.  Prior to his death taking an hour to fall asleep and now sleep is worse.    When she Split the dose of Depakote solved the NV problems.  Hard time sleeping. Ambien helps her fall asleep and then sleeps 5 hour and sometimes naps..  NM not often.  RLS resolved.  Reads a lot.  Exercises.   Limited caffeine and none afternoon  Can get gabapentin through [facility] but insurance wouldn't cover.   It helped with anxiety and some with RLS on 400 mg 1 BID and 2 HS..  Tolerated it well.     Reads a lot and that helps.  Voices not as bad.  Month of 06-30-23 is hard bc Dad died 4 years ago and father's day and his birthday is this month.  On Sinemet  mos per neuro. Meds for RLS changed by doctor and it's better.No dizziness with doxazosin.  Reducing caffeine helped RLS.  NM mostly controlled... Taking ambien 10 and doxazosin.  Doxazosin helped NM.  Had NM of F every night and would interfere.  Tired of dreaming of him.  They stopped..   Saphris helped her go to sleep.  Before it couldn't get to sleep.  Now average 5 hours sleep.  No naps.  No RLS right now with ropinirole better than pramipexole. Insurance change demand she stop Saphris, Latuda, loxapine is $100/month.  She looked up what she can afford risperidone, Geodon, haloperidol, Tegretol  She can't  afford Vraylar.  Current stressors $,  Had good realtionship with father who died 3 years ago.  Had good relationship with uncle who died late 01-14-2020.  Remote SI but did not hurt herself, was hospitalized.  Prior psychiatric medication trials include Latuda  restless legs, Fanapt which cause restless legs, Loxapine weight, Abilfiy 10, Saphris which caused weight gain and $, olanzapine weight gain, Geodon, Seroquel weight gain and restless legs, risperidone restless legs, Haloperidol 2 RLS, Liked saphris some.  loxapine cost and weight, Fanapt samples, Latuda helped mood but can't afford.  Lyrica weight gain, lithium side effects, lamotrigine, Equetro with mildly elevated liver enzymes, Lexapro, duloxetine, venlafaxine,  bupropion, fluoxetine, doxepin ineffective, Restoril lost effect, sertraline, Ambien, mirtazapine increased appetite, trazodone restlessness,, gabapentin.     Review of Systems:  Review of Systems  Gastrointestinal: Negative for nausea and vomiting.  Neurological: Negative for dizziness, tremors, weakness and headaches.       RLS controlled   Psychiatric/Behavioral: Positive for decreased concentration, depression, dysphoric mood and sleep disturbance. Negative for agitation, behavioral problems, confusion, hallucinations, self-injury and suicidal ideas. The patient is nervous/anxious. The patient is not hyperactive.     Medications: I have reviewed the patient's current medications.  Current Outpatient Medications  Medication Sig Dispense Refill  . cyclobenzaprine (FLEXERIL) 10 MG tablet TAKE TWO TABLETS BY MOUTH AT BEDTIME 60 tablet 1  . divalproex (DEPAKOTE ER) 500 MG 24 hr tablet TAKE 2 TABLETS(1000 MG) BY MOUTH AT BEDTIME (Patient taking differently: Take 500 mg by mouth 2 (two) times daily. ) 180 tablet 1  . doxazosin (CARDURA) 4 MG tablet TAKE 1 TABLET(4 MG) BY MOUTH AT BEDTIME 90 tablet 0  . gabapentin (NEURONTIN) 400 MG capsule 1 twice daily and 2 in the evening 120 capsule 0  . lamoTRIgine (LAMICTAL) 200 MG tablet TAKE 1 AND 1/2 TABLETS(300 MG) BY MOUTH DAILY 135 tablet 0  . LORazepam (ATIVAN) 1 MG tablet TAKE 1 TABLET BY MOUTH FOUR TIMES DAILY 120 tablet 2  . rOPINIRole (REQUIP) 2 MG tablet Take 2 mg at 5 pm.  Take in addition to 8mg  at bedtime. 30 tablet 5  . rOPINIRole (REQUIP) 4 MG tablet TAKE 2 TABLETS(8 MG) BY MOUTH AT BEDTIME 60 tablet 5  . sertraline (ZOLOFT) 100 MG tablet TAKE ONE TABLET BY MOUTH DAILY 90 tablet 0  . valACYclovir (VALTREX) 500 MG tablet Take one tablet by mouth twice daily for 3-5 days then daily as needed. 90 tablet 4  . zolpidem (AMBIEN) 10 MG tablet Take 1 tablet (10 mg total) by mouth at bedtime as needed for sleep. 30 tablet 2  . asenapine (SAPHRIS)  5 MG SUBL 24 hr tablet Place 1 tablet (5 mg total) under the tongue 2 (two) times daily. 60 tablet 1   No current facility-administered medications for this visit.    Medication Side Effects: Other: RLS  Allergies: No Known Allergies  Past Medical History:  Diagnosis Date  . Bipolar disorder ([place])   . HSV (herpes simplex virus) anogenital infection 05/2018  . RLS (restless legs syndrome)     Family History  Problem Relation Age of Onset  . Thyroid disease Mother   . COPD Mother   . Bipolar disorder Mother   . Parkinson's disease Father   . Breast cancer Paternal Grandmother 25  . Bipolar disorder Sister   . Schizophrenia Other     Social History   Socioeconomic History  . Marital status: Married    Spouse name: Not on file  .  Number of children: 0  . Years of education: 72  . Highest education level: Associate degree: academic program  Occupational History  . Occupation: unemployed  Tobacco Use  . Smoking status: Former Research scientist (life sciences)  . Smokeless tobacco: Never Used  Substance and Sexual Activity  . Alcohol use: No  . Drug use: No  . Sexual activity: Yes    Birth control/protection: None    Comment: 1st intercourse 70 yo-5 partners  Other Topics Concern  . 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    Social Determinants of Health   Financial Resource Strain:   . Difficulty of Paying Living Expenses: Not on file  Food Insecurity:   . Worried About Charity fundraiser in the Last Year: Not on file  . Ran Out of Food in the Last Year: Not on file  Transportation Needs:   . Lack of Transportation (Medical): Not on file  . Lack of Transportation (Non-Medical): Not on file  Physical Activity:   . Days of Exercise per Week: Not on file  . Minutes of Exercise per Session: Not on file  Stress:   . Feeling of  Stress : Not on file  Social Connections:   . Frequency of Communication with Friends and Family: Not on file  . Frequency of Social Gatherings with Friends and Family: Not on file  . Attends Religious Services: Not on file  . Active Member of Clubs or Organizations: Not on file  . Attends Archivist Meetings: Not on file  . Marital Status: Not on file  Intimate Partner Violence:   . Fear of Current or Ex-Partner: Not on file  . Emotionally Abused: Not on file  . Physically Abused: Not on file  . Sexually Abused: Not on file   Disability review approved.  Past Medical History, Surgical history, Social history, and Family history were reviewed and updated as appropriate.   Please see review of systems for further details on the patient's review from today.   Objective:   Physical Exam:  There were no vitals taken for this visit.  Physical Exam Neurological:     Mental Status: She is alert and oriented to person, place, and time.     Cranial Nerves: No dysarthria.  Psychiatric:        Attention and Perception: Attention normal. She perceives auditory hallucinations.        Mood and Affect: Mood is anxious and depressed.        Speech: Speech normal.        Behavior: Behavior is cooperative.        Thought Content: Thought content normal. Thought content is not paranoid or delusional. Thought content does not include homicidal or suicidal ideation. Thought content does not include homicidal or suicidal plan.        Cognition and Memory: Cognition and memory normal.        Judgment: Judgment normal.     Comments: Insight fair     Lab Review:     Component Value Date/Time   NA 138 08/25/2019 1114   K 3.9 08/25/2019 1114   CL 106 08/25/2019 1114   CO2 22 08/25/2019 1114   GLUCOSE 98 08/25/2019 1114   BUN 10 08/25/2019 1114   CREATININE 0.81 08/25/2019 1114  CALCIUM 9.6 08/25/2019 1114   PROT 6.7 08/25/2019 1114   ALBUMIN 4.7 03/20/2017 1100   AST 15  08/25/2019 1114   ALT 13 08/25/2019 1114   ALKPHOS 65 03/20/2017 1100   BILITOT 0.4 08/25/2019 1114       Component Value Date/Time   WBC 6.3 08/25/2019 1114   RBC 4.32 08/25/2019 1114   HGB 13.8 08/25/2019 1114   HCT 41.0 08/25/2019 1114   PLT 312 08/25/2019 1114   MCV 94.9 08/25/2019 1114   MCH 31.9 08/25/2019 1114   MCHC 33.7 08/25/2019 1114   RDW 11.8 08/25/2019 1114   LYMPHSABS 2,281 08/25/2019 1114   MONOABS 570 03/20/2017 1100   EOSABS 435 08/25/2019 1114   BASOSABS 101 08/25/2019 1114    No results found for: POCLITH, LITHIUM   No results found for: PHENYTOIN, PHENOBARB, VALPROATE, CBMZ   .res Assessment: Plan:    Geordyn was seen today for follow-up, depression, anxiety, sleeping problem and grief.  Diagnoses and all orders for this visit:  Bipolar disorder, current episode depressed, severe, with psychotic features ([place]) -     asenapine (SAPHRIS) 5 MG SUBL 24 hr tablet; Place 1 tablet (5 mg total) under the tongue 2 (two) times daily.  Primary insomnia -     asenapine (SAPHRIS) 5 MG SUBL 24 hr tablet; Place 1 tablet (5 mg total) under the tongue 2 (two) times daily.  Generalized anxiety disorder -     asenapine (SAPHRIS) 5 MG SUBL 24 hr tablet; Place 1 tablet (5 mg total) under the tongue 2 (two) times daily.  PTSD (post-traumatic stress disorder) -     asenapine (SAPHRIS) 5 MG SUBL 24 hr tablet; Place 1 tablet (5 mg total) under the tongue 2 (two) times daily.  Restless legs syndrome     Greater than 50% of face to face time with patient was spent on counseling and coordination of care. We discussed the following: Lavone has treatment resistant bipolar disorder with psychotic features versus schizoaffective disorder and other psychiatric diagnoses as well.  She has been very difficult to treat in part because she is very sensitive to antipsychotics and yet has chronic auditory hallucinations.    She is chronically anxious and depressed but the level of  depression was not severe with depression being mild and anxiety being moderate until her uncle died and now she describes both as being much worse.[name] Extensive history of psychiatric meds tried discussed with the patient.  She wants to restart Saphris because it helped with her sleep.  She will fight the weight gain problem that she had before with exercise. Overall patient has fairly poor coping mechanisms and is even easily overwhelmed with stress.  Disc difference between grief and depression in detail.  Nausea and vomiting resolved by splitting the Depakote dosage.   RLS managed and neuro checked iron studies reportedly ok.  Since the last visit her auditory hallucinations have stopped.  But we can still consider if needed, Clozapine option.    She wants to pursue Saphris instead.  Discussed this in detail including the risk of aplastic anemia and weight gain.  Option retry CBZ and follow liver enzymes as they were only mildly elevated.Disc DDI complications of CBZ and would need to check liver enzymes DT past history of mild elevations.  There are a lot of medication interactions.  This is the only option that won't worsen RLS except clozapine.   Fortunately her auditory hallucinations have stopped since her last visit.  Doxazosin  is helping NM.  Discussed the risk of morning dizziness.  Caffeine after noon.  This is helped.  Zolpidem 10 mg 1 nightly  Disc withdrawal from Ambien.  Discussed side effects including amnesia.  continue Gabapentin for off label anxiety and RLS at previous dosage.  400 BID and 800 pm.  Saphris 5 mg nightly and if needed go up to 10 mg nightly.  Discussed side effects in detail. Discussed potential metabolic side effects associated with atypical antipsychotics, as well as potential risk for movement side effects. Advised pt to contact office if movement side effects occur.   She knows it will help with her sleep but it also should reduce this tendency to  chronic auditory hallucinations and mood swings.  As well as anxiety.  Option Dayvigo, Belsomra if changing the timing of Ambien does not work she can try the samples with the Ambien if necessary.  She has chronic insomnia which is difficult to manage and she will have rebound and insomnia if she attempts to stop the Ambien abruptly.  Discussed Ambien amnesia and side effects.  The prognosis for improvement is very guarded from the use of medication.  She is very prone to EPS or restless legs from medications.    Follow-up in a couple of months.  Call if symptoms get worse  Lynder Parents MD, DFAPA  Please see After Visit Summary for patient specific instructions.  Future Appointments  Date Time Provider [place]  04/18/2020 10:30 AM Pieter Partridge, DO [id number] None    No orders of the defined types were placed in this encounter.     -------------------------------

## 2020-02-05 ENCOUNTER — Other Ambulatory Visit: Payer: Self-pay | Admitting: Psychiatry

## 2020-02-14 ENCOUNTER — Other Ambulatory Visit: Payer: Self-pay | Admitting: Psychiatry

## 2020-02-14 DIAGNOSIS — F5101 Primary insomnia: Secondary | ICD-10-CM

## 2020-02-27 ENCOUNTER — Other Ambulatory Visit: Payer: Self-pay | Admitting: Psychiatry

## 2020-03-03 DIAGNOSIS — Z0100 Encounter for examination of eyes and vision without abnormal findings: Secondary | ICD-10-CM | POA: Diagnosis not present

## 2020-03-03 DIAGNOSIS — H524 Presbyopia: Secondary | ICD-10-CM | POA: Diagnosis not present

## 2020-03-07 ENCOUNTER — Other Ambulatory Visit: Payer: Self-pay | Admitting: Neurology

## 2020-03-19 ENCOUNTER — Other Ambulatory Visit: Payer: Self-pay | Admitting: Psychiatry

## 2020-03-19 DIAGNOSIS — F4310 Post-traumatic stress disorder, unspecified: Secondary | ICD-10-CM

## 2020-03-19 DIAGNOSIS — F315 Bipolar disorder, current episode depressed, severe, with psychotic features: Secondary | ICD-10-CM

## 2020-03-19 DIAGNOSIS — F5101 Primary insomnia: Secondary | ICD-10-CM

## 2020-04-05 ENCOUNTER — Other Ambulatory Visit: Payer: Self-pay | Admitting: Neurology

## 2020-04-06 ENCOUNTER — Ambulatory Visit (INDEPENDENT_AMBULATORY_CARE_PROVIDER_SITE_OTHER): Payer: Medicare HMO | Admitting: Psychiatry

## 2020-04-06 ENCOUNTER — Other Ambulatory Visit: Payer: Self-pay

## 2020-04-06 ENCOUNTER — Other Ambulatory Visit: Payer: Self-pay | Admitting: Psychiatry

## 2020-04-06 ENCOUNTER — Encounter: Payer: Self-pay | Admitting: Psychiatry

## 2020-04-06 DIAGNOSIS — F99 Mental disorder, not otherwise specified: Secondary | ICD-10-CM | POA: Diagnosis not present

## 2020-04-06 DIAGNOSIS — F315 Bipolar disorder, current episode depressed, severe, with psychotic features: Secondary | ICD-10-CM

## 2020-04-06 DIAGNOSIS — F4310 Post-traumatic stress disorder, unspecified: Secondary | ICD-10-CM

## 2020-04-06 DIAGNOSIS — G2581 Restless legs syndrome: Secondary | ICD-10-CM | POA: Diagnosis not present

## 2020-04-06 DIAGNOSIS — F5105 Insomnia due to other mental disorder: Secondary | ICD-10-CM

## 2020-04-06 DIAGNOSIS — F411 Generalized anxiety disorder: Secondary | ICD-10-CM | POA: Diagnosis not present

## 2020-04-06 DIAGNOSIS — F5101 Primary insomnia: Secondary | ICD-10-CM

## 2020-04-06 NOTE — Telephone Encounter (Signed)
Apt this morning

## 2020-04-06 NOTE — Progress Notes (Signed)
Beth [name] [id number] 04-24-1976 44 y.o.    Subjective:   Patient ID:  Beth [name] is a 44 y.o. (DOB 04-May-1976) female.  Chief Complaint:  Chief Complaint  Patient presents with  . Follow-up  . Depression  . Anxiety  . Sleeping Problem  Depression        Associated symptoms include decreased concentration.  Associated symptoms include no headaches and no suicidal ideas.  Past medical history includes anxiety.   Anxiety Symptoms include decreased concentration and nervous/anxious behavior. Patient reports no confusion, dizziness, nausea or suicidal ideas.     Beth [name] presents to the office today for follow-up of TRD bipolar unstable which is difficult to treat due to med sensitivity and sleep problems.    seen August 25 , 2020.  Encouraged her to split the dose of Depakote ER 500 mg twice daily and get it away from bedtime to see if nausea and vomiting will improve.  She was encouraged to stop caffeine after noon because of insomnia and we switched from Ambien CR to regular Ambien 10 mg. Doing OK overall.    seen November 08, 2019.  She was still having some issues with sleep but was drinking caffeine too late and she was encouraged to stop caffeine after noon.  No other meds were changed.  Last seen January 18, 2020.  The following was noted: Not good.  Uncle living with them died 2023/04/12.  Had heart problems and apparent MI. Before that was relatively OK.  With new insurance can get generic Saphris for $24 .  It helped her sleep so well.  Doesn't remember how it worked for mood.  But wants to restart it for the sleep benefit and potential mood benefit.  Prior to his death mood swings were OK and anxiety manageable but it won't be that way now.  Has a lot more anxiety and depression.  Crying all the time.  Stressed over it.  She was to start Saphris 5 mg nightly and go up to 10 mg nightly if needed.[name]  April 23rd 2021 appointment, the following is noted: Took one Saphris  and had bad RLS the rest of the night.  Asked to increase doxazosin bc NM of uncles death.  On Sinemet  mos per neuro. Meds for RLS changed by doctor and it's better.No dizziness with doxazosin.  Reducing caffeine helped RLS.  NM mostly controlled... Taking ambien 10 and doxazosin.  Doxazosin helped NM.  Had NM of F every night and would interfere.  Tired of dreaming of him.  They stopped..   Saphris helped her go to sleep.  Before it couldn't get to sleep.  Now average 5 hours sleep.  No naps.  No RLS right now with ropinirole better than pramipexole. Insurance change demand she stop Saphris, Latuda, loxapine is $100/month.  She looked up what she can afford risperidone, Geodon, haloperidol, Tegretol  She can't afford Vraylar.  Current stressors $,  Had good realtionship with father who died 3 years ago.  Had good relationship with uncle who died late 02/06/2020.  Remote SI but did not hurt herself, was hospitalized.  Prior psychiatric medication trials include Latuda  restless legs, Fanapt which cause restless legs, Loxapine weight, Abilfiy 10, Saphris which caused weight gain & RLS, Liked saphris some.olanzapine weight gain, Geodon, Seroquel weight gain and restless legs, risperidone restless legs, Haloperidol 2 RLS,   loxapine cost and weight, Fanapt samples, Latuda helped mood but can't afford. Sinemet Lyrica weight gain, lithium side effects,  lamotrigine, Equetro with mildly elevated liver enzymes, Lexapro, duloxetine, venlafaxine, bupropion, fluoxetine, sertraline,    doxepin ineffective, Restoril lost effect,  Ambien, mirtazapine increased appetite, trazodone restlessness,, gabapentin.  Review of Systems:  Review of Systems  Gastrointestinal: Negative for nausea and vomiting.  Neurological: Negative for dizziness, tremors, weakness and headaches.       RLS controlled   Psychiatric/Behavioral: Positive for decreased concentration, depression, dysphoric mood and sleep  disturbance. Negative for agitation, behavioral problems, confusion, hallucinations, self-injury and suicidal ideas. The patient is nervous/anxious. The patient is not hyperactive.     Medications: I have reviewed the patient's current medications.  Current Outpatient Medications  Medication Sig Dispense Refill  . cyclobenzaprine (FLEXERIL) 10 MG tablet TAKE TWO TABLETS BY MOUTH EVERY NIGHT AT BEDTIME 60 tablet 0  . divalproex (DEPAKOTE ER) 500 MG 24 hr tablet TAKE 2 TABLETS(1000 MG) BY MOUTH AT BEDTIME (Patient taking differently: Take 500 mg by mouth 2 (two) times daily. ) 180 tablet 1  . doxazosin (CARDURA) 4 MG tablet TAKE 1 TABLET(4 MG) BY MOUTH AT BEDTIME 90 tablet 0  . gabapentin (NEURONTIN) 400 MG capsule TAKE ONE CAPSULE BY MOUTH TWO TIMES A DAY AND TWO  CAPSULES IN THE EVENING 120 capsule 1  . lamoTRIgine (LAMICTAL) 200 MG tablet TAKE 1 AND 1/2 TABLETS(300 MG) BY MOUTH DAILY 135 tablet 0  . LORazepam (ATIVAN) 1 MG tablet TAKE ONE TABLET BY MOUTH FOUR TIMES A DAY 120 tablet 2  . rOPINIRole (REQUIP) 2 MG tablet Take 2 mg at 5 pm.  Take in addition to 8mg  at bedtime. 30 tablet 5  . rOPINIRole (REQUIP) 4 MG tablet TAKE 2 TABLETS(8 MG) BY MOUTH AT BEDTIME 60 tablet 5  . sertraline (ZOLOFT) 100 MG tablet TAKE ONE TABLET BY MOUTH DAILY 90 tablet 0  . valACYclovir (VALTREX) 500 MG tablet Take one tablet by mouth twice daily for 3-5 days then daily as needed. 90 tablet 4  . zolpidem (AMBIEN) 10 MG tablet TAKE 1 TABLET(10 MG) BY MOUTH AT BEDTIME AS NEEDED FOR SLEEP 30 tablet 2  . asenapine (SAPHRIS) 5 MG SUBL 24 hr tablet Place 1 tablet (5 mg total) under the tongue 2 (two) times daily. (Patient not taking: Reported on 04/06/2020) 60 tablet 1   No current facility-administered medications for this visit.    Medication Side Effects: Other: RLS  Allergies: No Known Allergies  Past Medical History:  Diagnosis Date  . Bipolar disorder ([place])   . HSV (herpes simplex virus) anogenital infection  05/2018  . RLS (restless legs syndrome)     Family History  Problem Relation Age of Onset  . Thyroid disease Mother   . COPD Mother   . Bipolar disorder Mother   . Parkinson's disease Father   . Breast cancer Paternal Grandmother 73  . Bipolar disorder Sister   . Schizophrenia Other     Social History   Socioeconomic History  . Marital status: Married    Spouse name: Not on file  . Number of children: 0  . Years of education: 8  . Highest education level: Associate degree: academic program  Occupational History  . Occupation: unemployed  Tobacco Use  . Smoking status: Former Research scientist (life sciences)  . Smokeless tobacco: Never Used  Substance and Sexual Activity  . Alcohol use: No  . Drug use: No  . Sexual activity: Yes    Birth control/protection: None    Comment: 1st intercourse 83 yo-5 partners  Other Topics Concern  . 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    Social Determinants of Health   Financial Resource Strain:   . Difficulty of Paying Living Expenses:   Food Insecurity:   . Worried About Charity fundraiser in the Last Year:   . Arboriculturist in the Last Year:   Transportation Needs:   . Film/video editor (Medical):   [name] Lack of Transportation (Non-Medical):   Physical Activity:   . Days of Exercise per Week:   . Minutes of Exercise per Session:   Stress:   . Feeling of Stress :   Social Connections:   . Frequency of Communication with Friends and Family:   . Frequency of Social Gatherings with Friends and Family:   . Attends Religious Services:   . Active Member of Clubs or Organizations:   . Attends Archivist Meetings:   [name] Marital Status:   Intimate Partner Violence:   . Fear of Current or Ex-Partner:   . Emotionally Abused:   [name] Physically Abused:   . Sexually Abused:     Disability review approved.  Past Medical History, Surgical history, Social history, and Family history were reviewed and updated as appropriate.   Please see review of systems for further details on the patient's review from today.   Objective:   Physical Exam:  There were no vitals taken for this visit.  Physical Exam Constitutional:      General: She is not in acute distress. Musculoskeletal:        General: No deformity.  Neurological:     Mental Status: She is alert and oriented to person, place, and time.     Cranial Nerves: No dysarthria.     Coordination: Coordination normal.  Psychiatric:        Attention and Perception: Attention normal. She does not perceive auditory or visual hallucinations.        Mood and Affect: Mood is anxious and depressed. Affect is not labile, blunt, angry or inappropriate.        Speech: Speech normal.        Behavior: Behavior normal. Behavior is cooperative.        Thought Content: Thought content normal. Thought content is not paranoid or delusional. Thought content does not include homicidal or suicidal ideation. Thought content does not include homicidal or suicidal plan.        Cognition and Memory: Cognition and memory normal.        Judgment: Judgment normal.     Comments: Insight fair No voices lately     Lab Review:     Component Value Date/Time   NA 138 08/25/2019 1114   K 3.9 08/25/2019 1114   CL 106 08/25/2019 1114   CO2 22 08/25/2019 1114   GLUCOSE 98 08/25/2019 1114   BUN 10 08/25/2019 1114   CREATININE 0.81 08/25/2019 1114   CALCIUM 9.6 08/25/2019 1114   PROT 6.7 08/25/2019 1114   ALBUMIN 4.7 03/20/2017 1100   AST 15 08/25/2019 1114   ALT 13 08/25/2019 1114   ALKPHOS 65 03/20/2017 1100   BILITOT 0.4 08/25/2019 1114       Component Value Date/Time   WBC 6.3 08/25/2019 1114   RBC 4.32 08/25/2019 1114   HGB 13.8 08/25/2019 1114   HCT 41.0 08/25/2019 1114  PLT 312 08/25/2019 1114   MCV 94.9 08/25/2019  1114   MCH 31.9 08/25/2019 1114   MCHC 33.7 08/25/2019 1114   RDW 11.8 08/25/2019 1114   LYMPHSABS 2,281 08/25/2019 1114   MONOABS 570 03/20/2017 1100   EOSABS 435 08/25/2019 1114   BASOSABS 101 08/25/2019 1114    No results found for: POCLITH, LITHIUM   No results found for: PHENYTOIN, PHENOBARB, VALPROATE, CBMZ   .res Assessment: Plan:    Mauren was seen today for follow-up, depression, anxiety and sleeping problem.  Diagnoses and all orders for this visit:  Bipolar disorder, current episode depressed, severe, with psychotic features ([place])  Primary insomnia  PTSD (post-traumatic stress disorder)  Generalized anxiety disorder  Insomnia due to other mental disorder  Restless legs syndrome     Greater than 50% of face to face time with patient was spent on counseling and coordination of care. We discussed the following: Kendallynn has treatment resistant bipolar disorder with psychotic features versus schizoaffective disorder and other psychiatric diagnoses as well.  She has been very difficult to treat in part because she is very sensitive to antipsychotics and yet has chronic auditory hallucinations.    She is chronically anxious and depressed but the level of depression was not severe with depression being mild and anxiety being moderate until her uncle died and now she describes both as being much worse.[name] Extensive history of psychiatric meds tried discussed with the patient.   Overall patient is easily overwhelmed with stress.  Saphris failed this time because it triggered restless legs after 1 dosage.  TR insomnia chronically.  Option Dayvigo, Belsomra if changing the timing of Ambien does not work she can try the samples with the Ambien if necessary.  She has chronic insomnia which is difficult to manage and she will have rebound and insomnia if she attempts to stop the Ambien abruptly.  Discussed Ambien amnesia and side effects. Jarold Motto is likely more effective .  Trial 5 mg  HS for 1 week, then trial 10 mg HS.  Since the last visit her auditory hallucinations have stopped.  But we can still consider if needed, Clozapine option.    She is open to considering this medication.  Discussed this in detail including the risk of aplastic anemia and weight gain.    Option retry CBZ and follow liver enzymes as they were only mildly elevated.Disc DDI complications of CBZ and would need to check liver enzymes DT past history of mild elevations.  There are a lot of medication interactions.  This is the only option that won't worsen RLS except clozapine.   Fortunately her auditory hallucinations have stopped since her last visit.  Disc difference between grief and depression in detail.  Nausea and vomiting resolved by splitting the Depakote dosage.   RLS managed and neuro checked iron studies reportedly ok.  Doxazosin less helping NM.  Discussed the risk of morning dizziness.  She wants to increase it bc NM and poor sleep.  Increase to 6 mg HS.  No  Caffeine after noon.  This is helped.  04/18/20 appt with neuro about RLS.  Ask for iron studies if not done lately.  Zolpidem 10 mg 1 nightly  Disc withdrawal from Ambien.  Discussed side effects including amnesia.  continue Gabapentin for off label anxiety and RLS at previous dosage.  400 BID and 800 pm.  The prognosis for improvement is very guarded from the use of medication.  She is very prone to EPS or restless  legs from medications.    Follow-up in a month.  If Jarold Motto fails one of the few options left in his clozapine.  She is interested in pursuing it.  We discussed the laboratory tests and side effects etc.  Lynder Parents MD, DFAPA  Please see After Visit Summary for patient specific instructions.  Future Appointments  Date Time Provider [place]  04/18/2020 10:30 AM Pieter Partridge, DO [facility] None    No orders of the defined types were placed in this encounter.     -------------------------------

## 2020-04-17 NOTE — Progress Notes (Signed)
Virtual Visit via Video Note The purpose of this virtual visit is to provide medical care while limiting exposure to the novel coronavirus.    Consent was obtained for video visit:  Yes.   Answered questions that patient had about telehealth interaction:  Yes.   I discussed the limitations, risks, security and privacy concerns of performing an evaluation and management service by telemedicine. I also discussed with the patient that there may be a patient responsible charge related to this service. The patient expressed understanding and agreed to proceed.  Pt location: Home Physician Location: office Name of referring provider:  Glendale Chard, MD I connected with Waylan Boga at patients initiation/request on 04/18/2020 at 10:30 AM EDT by video enabled telemedicine application and verified that I am speaking with the correct person using two identifiers. Pt MRN:  [id number] Pt DOB:  1976/07/06 Video Participants:  Waylan Boga   History of Present Illness:  Beth [name] a 44 year old woman with bipolar disorder, PTSD, generalized anxiety disorder and primary insomnia who follows up for restless leg syndrome.  UPDATE: Current medications: ropinirole 2mg  at 5pm and 8mg  at bedtime; gabapentin 400mg  (9 AM)/400mg  (2 PM)/1200mg  (11 PM);Depakote ER 250mg  twice daily, lamotrigine 300mg  daily, doxazosin 2mg  at bedtime, eszopiclone 3mg  at bedtime, Ativan 1mg  , sertraline 100mg  daily, ferrous sulfate 325mg , cyclobenzaprine 10mg  at bedtime; Ambien  RLS still uncontrolled.  The extra ropinirole in the evening makes her drowsy.  HISTORY: She started experiencing restless leg symptoms when she was 44 years old after initiating medications for her bipolar disorder. Every night in bed, she notes painful needles sensation in the legs that causes her to move her legs around. However, moving the legs or walking around may only mildly help alleviate it. Comorbidities include bipolar disorder  with depression, generalized anxiety disorder, and primary insomnia.Ferritin level was 54.5.  Past medications: Lyrica (weight gain),Mirapex 2mg  2-3 hours before bedtime,carbidopa-levodopa CR 25/100mg  at bedtime, iron supplement, clonazepam 0.5mg  four times daily.  Her insurance would not cover [place]  Her father had Parkinson's disease.  Past Medical History: Past Medical History:  Diagnosis Date  . Bipolar disorder ([place])   . HSV (herpes simplex virus) anogenital infection 05/2018  . RLS (restless legs syndrome)     Medications: Outpatient Encounter Medications as of 04/18/2020  Medication Sig  . asenapine (SAPHRIS) 5 MG SUBL 24 hr tablet Place 1 tablet (5 mg total) under the tongue 2 (two) times daily. (Patient not taking: Reported on 04/06/2020)  . cyclobenzaprine (FLEXERIL) 10 MG tablet TAKE TWO TABLETS BY MOUTH EVERY NIGHT AT BEDTIME  . divalproex (DEPAKOTE ER) 500 MG 24 hr tablet TAKE 2 TABLETS(1000 MG) BY MOUTH AT BEDTIME (Patient taking differently: Take 500 mg by mouth 2 (two) times daily. )  . doxazosin (CARDURA) 4 MG tablet TAKE 1 TABLET(4 MG) BY MOUTH AT BEDTIME  . gabapentin (NEURONTIN) 400 MG capsule TAKE ONE CAPSULE BY MOUTH TWICE A DAY AND 2 CAPSULES IN THE EVENING  . lamoTRIgine (LAMICTAL) 200 MG tablet TAKE 1 AND 1/2 TABLETS(300 MG) BY MOUTH DAILY  . LORazepam (ATIVAN) 1 MG tablet TAKE ONE TABLET BY MOUTH FOUR TIMES A DAY  . rOPINIRole (REQUIP) 2 MG tablet Take 2 mg at 5 pm.  Take in addition to 8mg  at bedtime.  [name] rOPINIRole (REQUIP) 4 MG tablet TAKE 2 TABLETS(8 MG) BY MOUTH AT BEDTIME  . sertraline (ZOLOFT) 100 MG tablet TAKE ONE TABLET BY MOUTH DAILY  . valACYclovir (VALTREX) 500 MG tablet Take one tablet by mouth  twice daily for 3-5 days then daily as needed.  . zolpidem (AMBIEN) 10 MG tablet TAKE 1 TABLET(10 MG) BY MOUTH AT BEDTIME AS NEEDED FOR SLEEP   No facility-administered encounter medications on file as of 04/18/2020.    Allergies: No Known  Allergies  Family History: Family History  Problem Relation Age of Onset  . Thyroid disease Mother   . COPD Mother   . Bipolar disorder Mother   . Parkinson's disease Father   . Breast cancer Paternal Grandmother 34  . Bipolar disorder Sister   . Schizophrenia Other     Social History: Social History   Socioeconomic History  . Marital status: Married    Spouse name: Not on file  . Number of children: 0  . Years of education: 46  . Highest education level: Associate degree: academic program  Occupational History  . Occupation: unemployed  Tobacco Use  . Smoking status: Former Research scientist (life sciences)  . Smokeless tobacco: Never Used  Substance and Sexual Activity  . Alcohol use: No  . Drug use: No  . Sexual activity: Yes    Birth control/protection: None    Comment: 1st intercourse 51 yo-5 partners  Other Topics Concern  . 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    Social Determinants of Health   Financial Resource Strain:   . Difficulty of Paying Living Expenses:   Food Insecurity:   . Worried About Charity fundraiser in the Last Year:   . Arboriculturist in the Last Year:   Transportation Needs:   . Film/video editor (Medical):   [name] Lack of Transportation (Non-Medical):   Physical Activity:   . Days of Exercise per Week:   . Minutes of Exercise per Session:   Stress:   . Feeling of Stress :   Social Connections:   . Frequency of Communication with Friends and Family:   . Frequency of Social Gatherings with Friends and Family:   . Attends Religious Services:   . Active Member of Clubs or Organizations:   . Attends Archivist Meetings:   [name] Marital Status:   Intimate Partner Violence:   . Fear of Current or Ex-Partner:   . Emotionally Abused:   [name] Physically Abused:   . Sexually Abused:      Observations/Objective:   Height 5\' 7"  (1.702 m), weight 155 lb 12.8 oz (70.7 kg). No acute distress.  Alert and oriented.  Speech fluent and not dysarthric.  Language intact.  Eyes orthophoric on primary gaze.  Face symmetric.  Assessment and Plan:   Restless leg syndrome, intractable  She has failed multiple therapies, including Requip, Mirapex, carbidopa-levodopa, clonazepam, Lyrica, gabapentin and iron and B12 supplementation.  At this point, I will refer her to a movement disorder specialist.    Follow Up Instructions:    -I discussed the assessment and treatment plan with the patient. The patient was provided an opportunity to ask questions and all were answered. The patient agreed with the plan and demonstrated an understanding of the instructions.   The patient was advised to call back or seek an in-person evaluation if the symptoms worsen or if the condition fails to improve as anticipated.   Dudley Major, DO

## 2020-04-18 ENCOUNTER — Encounter: Payer: Self-pay | Admitting: Neurology

## 2020-04-18 ENCOUNTER — Ambulatory Visit: Payer: Medicare HMO | Admitting: Neurology

## 2020-04-18 VITALS — BP 117/74 | HR 79 | Ht 67.0 in | Wt 155.8 lb

## 2020-04-18 DIAGNOSIS — G2581 Restless legs syndrome: Secondary | ICD-10-CM | POA: Diagnosis not present

## 2020-04-18 NOTE — Patient Instructions (Signed)
I will refer you to a Movement Disorder Clinic with neurologists that specialize in treating restless leg.  Either [facility] or [facility]

## 2020-05-02 ENCOUNTER — Ambulatory Visit: Payer: Medicare HMO | Admitting: Psychiatry

## 2020-05-03 ENCOUNTER — Other Ambulatory Visit: Payer: Self-pay | Admitting: Neurology

## 2020-05-10 ENCOUNTER — Other Ambulatory Visit: Payer: Self-pay | Admitting: Psychiatry

## 2020-05-11 ENCOUNTER — Other Ambulatory Visit: Payer: Self-pay

## 2020-05-11 NOTE — Telephone Encounter (Signed)
Check with patient not sure if neurologist is prescribing

## 2020-05-15 ENCOUNTER — Other Ambulatory Visit: Payer: Self-pay | Admitting: Psychiatry

## 2020-05-15 DIAGNOSIS — F5101 Primary insomnia: Secondary | ICD-10-CM

## 2020-05-28 ENCOUNTER — Other Ambulatory Visit: Payer: Self-pay | Admitting: Psychiatry

## 2020-05-31 ENCOUNTER — Other Ambulatory Visit: Payer: Self-pay | Admitting: Neurology

## 2020-05-31 ENCOUNTER — Other Ambulatory Visit: Payer: Self-pay

## 2020-05-31 MED ORDER — ROPINIROLE HCL 2 MG PO TABS: ORAL_TABLET | ORAL | 5 refills | Status: DC

## 2020-05-31 NOTE — Telephone Encounter (Signed)
Refill sent to [facility] per Dr. [name] and Pt. Pt referred out this should give her enough until her appt.

## 2020-05-31 NOTE — Telephone Encounter (Signed)
Patient needs to have her refill sent to [facility] on [address] for the ropinirole 2 mg

## 2020-06-01 ENCOUNTER — Other Ambulatory Visit: Payer: Self-pay

## 2020-06-01 ENCOUNTER — Other Ambulatory Visit: Payer: Self-pay | Admitting: Neurology

## 2020-06-01 DIAGNOSIS — F4310 Post-traumatic stress disorder, unspecified: Secondary | ICD-10-CM

## 2020-06-01 DIAGNOSIS — F411 Generalized anxiety disorder: Secondary | ICD-10-CM

## 2020-06-01 DIAGNOSIS — F315 Bipolar disorder, current episode depressed, severe, with psychotic features: Secondary | ICD-10-CM

## 2020-06-01 MED ORDER — SERTRALINE HCL 100 MG PO TABS: 100.0000 mg | ORAL_TABLET | Freq: Every day | ORAL | 1 refills | Status: DC

## 2020-06-06 ENCOUNTER — Encounter: Payer: Self-pay | Admitting: Psychiatry

## 2020-06-06 ENCOUNTER — Other Ambulatory Visit: Payer: Self-pay

## 2020-06-06 ENCOUNTER — Ambulatory Visit (INDEPENDENT_AMBULATORY_CARE_PROVIDER_SITE_OTHER): Payer: Medicare HMO | Admitting: Psychiatry

## 2020-06-06 DIAGNOSIS — F5101 Primary insomnia: Secondary | ICD-10-CM

## 2020-06-06 DIAGNOSIS — F5105 Insomnia due to other mental disorder: Secondary | ICD-10-CM | POA: Diagnosis not present

## 2020-06-06 DIAGNOSIS — F411 Generalized anxiety disorder: Secondary | ICD-10-CM

## 2020-06-06 DIAGNOSIS — F315 Bipolar disorder, current episode depressed, severe, with psychotic features: Secondary | ICD-10-CM

## 2020-06-06 DIAGNOSIS — F4310 Post-traumatic stress disorder, unspecified: Secondary | ICD-10-CM

## 2020-06-06 DIAGNOSIS — G2581 Restless legs syndrome: Secondary | ICD-10-CM

## 2020-06-06 DIAGNOSIS — F99 Mental disorder, not otherwise specified: Secondary | ICD-10-CM

## 2020-06-06 NOTE — Progress Notes (Signed)
Beth [name] [id number] [birth year] 44 y.o.  Subjective:   Patient ID:  Beth [name] is a 44 y.o. (DOB Nov 29, 1976) female.  Chief Complaint:  Chief Complaint  Patient presents with  . Follow-up  . Sleeping Problem  . Depression  . Anxiety    HPI Beth [name] presents to the office today for follow-up of the below.  Beth [name] presents to the office today for follow-up of TRD bipolar unstable which is difficult to treat due to med sensitivity and sleep problems.    seen August 25 , 2020.  Encouraged her to split the dose of Depakote ER 500 mg twice daily and get it away from bedtime to see if nausea and vomiting will improve.  She was encouraged to stop caffeine after noon because of insomnia and we switched from Ambien CR to regular Ambien 10 mg. Doing OK overall.    seen November 08, 2019.  She was still having some issues with sleep but was drinking caffeine too late and she was encouraged to stop caffeine after noon.  No other meds were changed.  seen January 18, 2020.  The following was noted: Not good.  Uncle living with them died 03/23/2023.  Had heart problems and apparent MI. Before that was relatively OK.  With new insurance can get generic Saphris for $24 .  It helped her sleep so well.  Doesn't remember how it worked for mood.  But wants to restart it for the sleep benefit and potential mood benefit.  Prior to his death mood swings were OK and anxiety manageable but it won't be that way now.  Has a lot more anxiety and depression.  Crying all the time.  Stressed over it.  She was to start Saphris 5 mg nightly and go up to 10 mg nightly if needed.[name]  April 23rd 2021 appointment, the following is noted: Took one Saphris and had bad RLS the rest of the night.  Asked to increase doxazosin bc NM of uncles death. On Sinemet  mos per neuro. Meds for RLS changed by doctor and it's better.No dizziness with doxazosin.  Reducing caffeine helped RLS. NM mostly controlled...  Taking ambien 10 and doxazosin. Doxazosin helped NM. Had NM of F every night and would interfere. Tired of dreaming of him. They stopped.. Saphris helped her go to sleep. Before it couldn't get to sleep. Now average 5 hours sleep. No naps. No RLS right now with ropinirole better than pramipexole. Insurance change demand she stop Saphris, Latuda, loxapine is $100/month.  She looked up what she can afford risperidone, Geodon, haloperidol, Tegretol  She can't afford Vraylar. Current stressors $, Plan: pt wanted to increase doxazosin to 6 mg HS for residual NM.  06/06/20 appt with the following noted: Tolerating meds.   NM controlled.  RLS is still in evening and not much at night. Had good realtionship with father who died 3 years ago.  Had good relationship with uncle who died late 01-17-20. Neurontin and Requip help most of all the meds.   Asked about weight gain with Depakote. Sleep is better with better schedule with husband's job. Overall mood is pretty stable except anniversary events.  Remote SI but did not hurt herself, was hospitalized.  Prior psychiatric medication trials include Latuda  restless legs, Fanapt which cause restless legs, Loxapine weight, Abilfiy 10, Saphris which caused weight gain & RLS, Liked saphris some.olanzapine weight gain, Geodon, Seroquel weight gain and restless legs, risperidone restless legs, Haloperidol 2 RLS,   loxapine  cost and weight, Fanapt samples, Latuda helped mood but can't afford. Sinemet, gabapentin. Mirapex, ropinirole Lyrica weight gain, lithium side effects, lamotrigine, Equetro with mildly elevated liver enzymes,  Lexapro, duloxetine, venlafaxine, bupropion, fluoxetine, sertraline,    doxepin ineffective, Restoril lost effect,  Ambien, mirtazapine increased appetite, trazodone restlessness,,   Review of Systems:  Review of Systems  Neurological: Negative for tremors and weakness.    Medications: I have reviewed the  patient's current medications.  Current Outpatient Medications  Medication Sig Dispense Refill  . cyclobenzaprine (FLEXERIL) 10 MG tablet TAKE TWO TABLETS BY MOUTH EVERY NIGHT AT BEDTIME 60 tablet 0  . divalproex (DEPAKOTE ER) 500 MG 24 hr tablet TAKE 2 TABLETS(1000 MG) BY MOUTH AT BEDTIME (Patient taking differently: Take 500 mg by mouth 2 (two) times daily. ) 180 tablet 1  . doxazosin (CARDURA) 4 MG tablet TAKE 1 TABLET(4 MG) BY MOUTH AT BEDTIME (Patient taking differently: Take 6 mg by mouth at bedtime. ) 90 tablet 0  . gabapentin (NEURONTIN) 400 MG capsule TAKE ONE CAPSULE BY MOUTH TWICE A DAY AND 2 CAPSULES IN THE EVENING 120 capsule 0  . lamoTRIgine (LAMICTAL) 200 MG tablet TAKE 1 AND 1/2 TABLETS(300 MG) BY MOUTH DAILY 135 tablet 0  . LORazepam (ATIVAN) 1 MG tablet TAKE ONE TABLET BY MOUTH FOUR TIMES A DAY 120 tablet 2  . rOPINIRole (REQUIP) 2 MG tablet Take 2 mg at 5 pm.  Take in addition to 8mg  at bedtime. 30 tablet 5  . rOPINIRole (REQUIP) 4 MG tablet TAKE TWO TABLETS BY MOUTH AT BEDTIME 60 tablet 0  . sertraline (ZOLOFT) 100 MG tablet Take 1 tablet (100 mg total) by mouth daily. 90 tablet 1  . valACYclovir (VALTREX) 500 MG tablet Take one tablet by mouth twice daily for 3-5 days then daily as needed. 90 tablet 4  . zolpidem (AMBIEN) 10 MG tablet TAKE ONE TABLET BY MOUTH AT BEDTIME AS NEEDED 30 tablet 0   No current facility-administered medications for this visit.    Medication Side Effects: None  Allergies: No Known Allergies  Past Medical History:  Diagnosis Date  . Bipolar disorder ([place])   . HSV (herpes simplex virus) anogenital infection 05/2018  . RLS (restless legs syndrome)     Family History  Problem Relation Age of Onset  . Thyroid disease Mother   . COPD Mother   . Bipolar disorder Mother   . Parkinson's disease Father   . Breast cancer Paternal Grandmother 49  . Bipolar disorder Sister   . Schizophrenia Other     Social History   Socioeconomic History   . Marital status: Married    Spouse name: Not on file  . Number of children: 0  . Years of education: 66  . Highest education level: Associate degree: academic program  Occupational History  . Occupation: unemployed  Tobacco Use  . Smoking status: Former Research scientist (life sciences)  . Smokeless tobacco: Never Used  Vaping Use  . Vaping Use: Never used  Substance and Sexual Activity  . Alcohol use: No  . Drug use: No  . Sexual activity: Yes    Birth control/protection: None    Comment: 1st intercourse 38 yo-5 partners  Other Topics Concern  . 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    Social Determinants of  Health   Financial Resource Strain:   . Difficulty of Paying Living Expenses:   Food Insecurity:   . Worried About Charity fundraiser in the Last Year:   . Arboriculturist in the Last Year:   Transportation Needs:   . Film/video editor (Medical):   [name] Lack of Transportation (Non-Medical):   Physical Activity:   . Days of Exercise per Week:   . Minutes of Exercise per Session:   Stress:   . Feeling of Stress :   Social Connections:   . Frequency of Communication with Friends and Family:   . Frequency of Social Gatherings with Friends and Family:   . Attends Religious Services:   . Active Member of Clubs or Organizations:   . Attends Archivist Meetings:   [name] Marital Status:   Intimate Partner Violence:   . Fear of Current or Ex-Partner:   . Emotionally Abused:   [name] Physically Abused:   . Sexually Abused:     Past Medical History, Surgical history, Social history, and Family history were reviewed and updated as appropriate.   Please see review of systems for further details on the patient's review from today.   Objective:   Physical Exam:  There were no vitals taken for this visit.  Physical  Exam Constitutional:      General: She is not in acute distress. Musculoskeletal:        General: No deformity.  Neurological:     Mental Status: She is alert and oriented to person, place, and time.     Coordination: Coordination normal.  Psychiatric:        Attention and Perception: Attention and perception normal. She does not perceive auditory or visual hallucinations.        Mood and Affect: Mood is anxious and depressed. Affect is not labile, blunt, angry or inappropriate.        Speech: Speech normal.        Behavior: Behavior normal.        Thought Content: Thought content normal. Thought content is not paranoid or delusional. Thought content does not include homicidal or suicidal ideation. Thought content does not include homicidal or suicidal plan.        Cognition and Memory: Cognition and memory normal.        Judgment: Judgment normal.     Comments: Insight intact Patient has varying levels of depression and anxiety but overall is better in the last few months than when first seen.     Lab Review:     Component Value Date/Time   NA 138 08/25/2019 1114   K 3.9 08/25/2019 1114   CL 106 08/25/2019 1114   CO2 22 08/25/2019 1114   GLUCOSE 98 08/25/2019 1114   BUN 10 08/25/2019 1114   CREATININE 0.81 08/25/2019 1114   CALCIUM 9.6 08/25/2019 1114   PROT 6.7 08/25/2019 1114   ALBUMIN 4.7 03/20/2017 1100   AST 15 08/25/2019 1114   ALT 13 08/25/2019 1114   ALKPHOS 65 03/20/2017 1100   BILITOT 0.4 08/25/2019 1114       Component Value Date/Time   WBC 6.3 08/25/2019 1114   RBC 4.32 08/25/2019 1114   HGB 13.8 08/25/2019 1114   HCT 41.0 08/25/2019 1114   PLT 312 08/25/2019 1114   MCV 94.9 08/25/2019 1114   MCH 31.9 08/25/2019 1114   MCHC 33.7 08/25/2019 1114   RDW 11.8 08/25/2019 1114   LYMPHSABS 2,281 08/25/2019 1114  MONOABS 570 03/20/2017 1100   EOSABS 435 08/25/2019 1114   BASOSABS 101 08/25/2019 1114    No results found for: POCLITH, LITHIUM   No  results found for: PHENYTOIN, PHENOBARB, VALPROATE, CBMZ   .res Assessment: Plan:    Calinda was seen today for follow-up, sleeping problem, depression and anxiety.  Diagnoses and all orders for this visit:  Bipolar disorder, current episode depressed, severe, with psychotic features ([place])  RLS (restless legs syndrome)  PTSD (post-traumatic stress disorder)  Generalized anxiety disorder  Insomnia due to other mental disorder  Primary insomnia    Greater than 50% of face to face time with patient was spent on counseling and coordination of care. We discussed the following: Khyla has treatment resistant bipolar disorder with psychotic features versus schizoaffective disorder and other psychiatric diagnoses as well.  She has been very difficult to treat in part because she is very sensitive to antipsychotics and yet has chronic auditory hallucinations.    She is chronically anxious and depressed but the level of depression was not severe with depression being mild and anxiety being moderate.  Extensive history of psychiatric meds tried discussed with the patient.   Overall patient is easily overwhelmed with stress.  TR insomnia chronically.  Option Dayvigo, Belsomra if changing the timing of Ambien does not work she can try the samples with the Ambien if necessary.  She has chronic insomnia which is difficult to manage and she will have rebound and insomnia if she attempts to stop the Ambien abruptly.  Discussed Ambien amnesia and side effects. No change in sleep meds this visit  Lately her auditory hallucinations have stopped.  But we can still consider if needed, Clozapine option.      Option retry CBZ and follow liver enzymes as they were only mildly elevated.Disc DDI complications of CBZ and would need to check liver enzymes DT past history of mild elevations.  There are a lot of medication interactions.  This is the only option that won't worsen RLS except clozapine.    Nausea and  vomiting resolved by splitting the Depakote dosage.  She still is concerned about potential weight gain from this med and we discussed this in detail.  Emotionally she is doing better than usual and I would prefer not to change meds given that the only reasonable alternative would be carbamazepine.  RLS managed and neuro checked iron studies reportedly ok.   The increase to 6 mg HS doxazosin was successful at managing nightmares.  No  Caffeine after noon.  This is helped.  04/18/20 appt with neuro about RLS.    Iron studies were normal.  The neurologist suggested referral to a movement disorder specialist.  We discussed at length that there are occasions in which going too high with a dopamine agonist can actually worsen restless legs rather than improve it.  She is at a high dose of ropinirole.  She does not want to go to see another doctor about this at this time.  Given that situation it was suggested that she try reducing the evening dose of ropinirole to 6 mg from 8 mg given that her restless legs is only a problem in the evening before she goes to bed and not after.  Zolpidem 10 mg 1 nightly  Disc withdrawal from Ambien.  Discussed side effects including amnesia.  continue Gabapentin for off label anxiety and RLS at previous dosage.  400 BID and 800 pm.  This has been helpful though has not resolve the  problem.  The prognosis for improvement is very guarded from the use of medication.  She is very prone to EPS or restless legs from medications.    Discussed the risk of polypharmacy.  She is on multiple medications.  No med change today.  Follow-up 4 months  Lynder Parents MD, DFAPA Please see After Visit Summary for patient specific instructions.  Future Appointments  Date Time Provider [place]  10/02/2020  9:00 AM Cottle, Billey Co., MD CP-CP None    No orders of the defined types were placed in this encounter.   -------------------------------

## 2020-06-07 ENCOUNTER — Other Ambulatory Visit: Payer: Self-pay | Admitting: Psychiatry

## 2020-06-08 ENCOUNTER — Other Ambulatory Visit: Payer: Self-pay

## 2020-06-08 DIAGNOSIS — F315 Bipolar disorder, current episode depressed, severe, with psychotic features: Secondary | ICD-10-CM

## 2020-06-08 MED ORDER — DIVALPROEX SODIUM ER 500 MG PO TB24: ORAL_TABLET | ORAL | 1 refills | Status: DC

## 2020-06-15 ENCOUNTER — Other Ambulatory Visit: Payer: Self-pay | Admitting: Psychiatry

## 2020-06-15 DIAGNOSIS — F4310 Post-traumatic stress disorder, unspecified: Secondary | ICD-10-CM

## 2020-06-15 DIAGNOSIS — F5101 Primary insomnia: Secondary | ICD-10-CM

## 2020-06-18 ENCOUNTER — Other Ambulatory Visit: Payer: Self-pay | Admitting: Psychiatry

## 2020-06-18 DIAGNOSIS — F315 Bipolar disorder, current episode depressed, severe, with psychotic features: Secondary | ICD-10-CM

## 2020-06-23 ENCOUNTER — Other Ambulatory Visit: Payer: Self-pay | Admitting: Neurology

## 2020-06-24 ENCOUNTER — Other Ambulatory Visit: Payer: Self-pay | Admitting: Psychiatry

## 2020-07-02 ENCOUNTER — Other Ambulatory Visit: Payer: Self-pay | Admitting: Neurology

## 2020-07-04 ENCOUNTER — Other Ambulatory Visit: Payer: Self-pay | Admitting: Psychiatry

## 2020-07-11 ENCOUNTER — Other Ambulatory Visit: Payer: Self-pay | Admitting: Psychiatry

## 2020-07-11 DIAGNOSIS — F5101 Primary insomnia: Secondary | ICD-10-CM

## 2020-07-31 ENCOUNTER — Other Ambulatory Visit: Payer: Self-pay | Admitting: Neurology

## 2020-08-03 ENCOUNTER — Other Ambulatory Visit: Payer: Self-pay | Admitting: Psychiatry

## 2020-08-06 ENCOUNTER — Other Ambulatory Visit: Payer: Self-pay | Admitting: Psychiatry

## 2020-08-06 IMAGING — MG DIGITAL SCREENING BILATERAL MAMMOGRAM WITH TOMO AND CAD
8 series · 8 of 24 positions shown · non-contrast
Comparison: None.

CLINICAL DATA: Screening.

EXAM:
DIGITAL SCREENING BILATERAL MAMMOGRAM WITH TOMO AND CAD

[R MLO synth-2D]
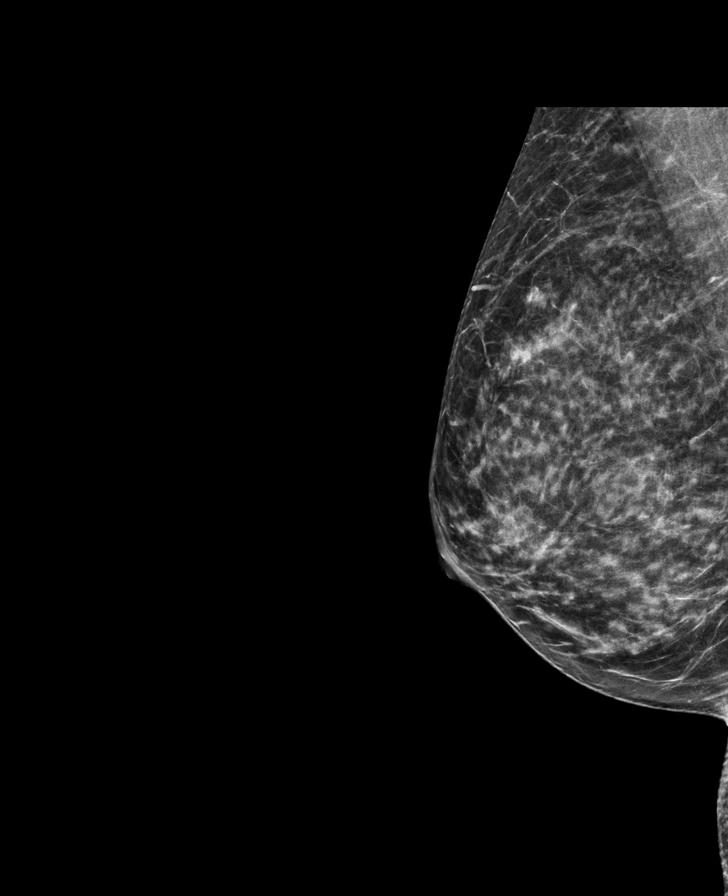

[L CC synth-2D]
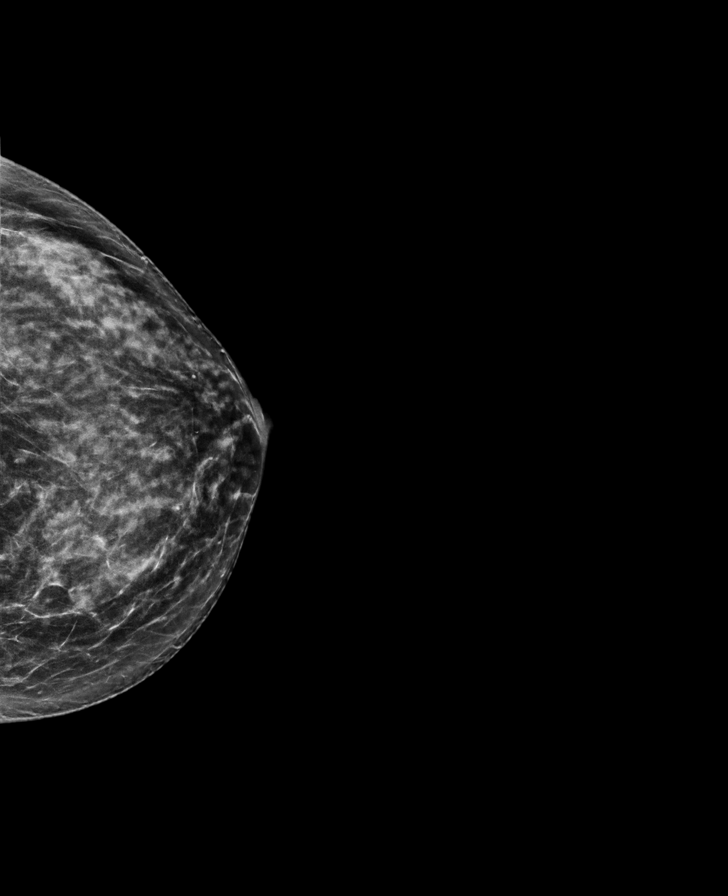

[R CC synth-2D]
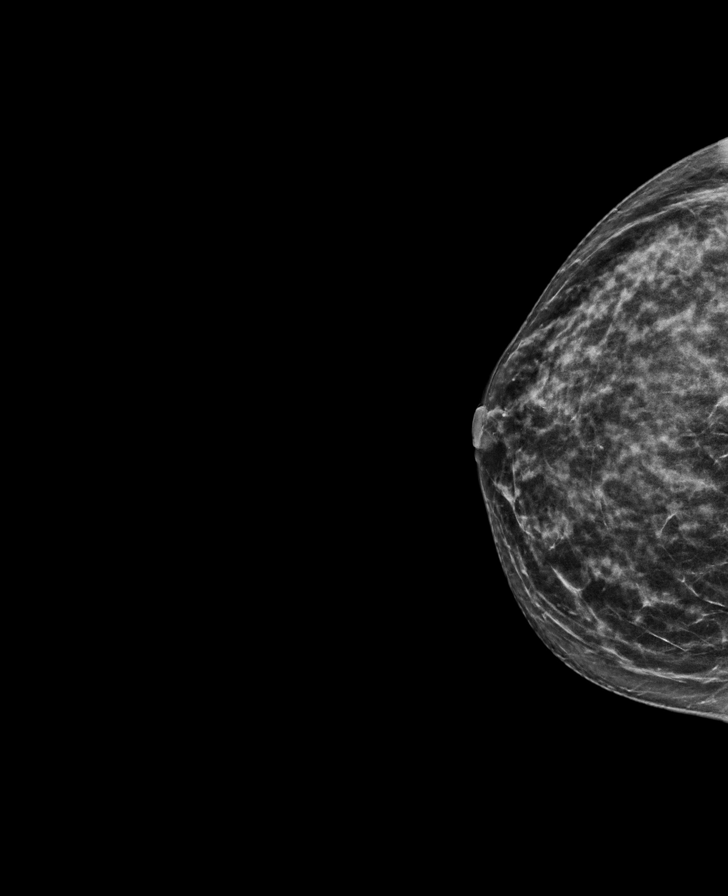

[L MLO synth-2D]
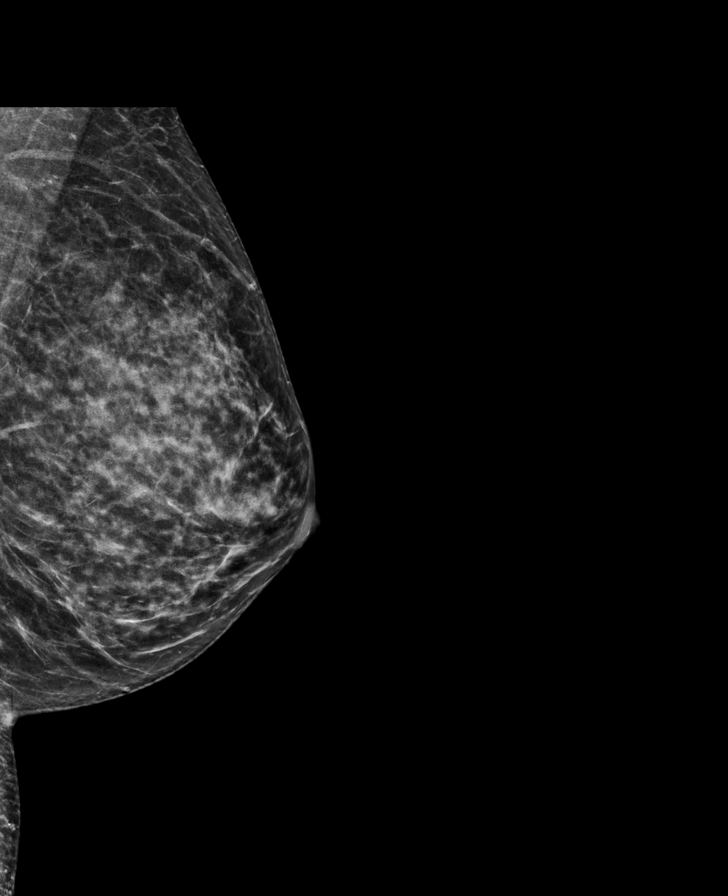

[R MLO tomo · tomo slice 31/62.0]
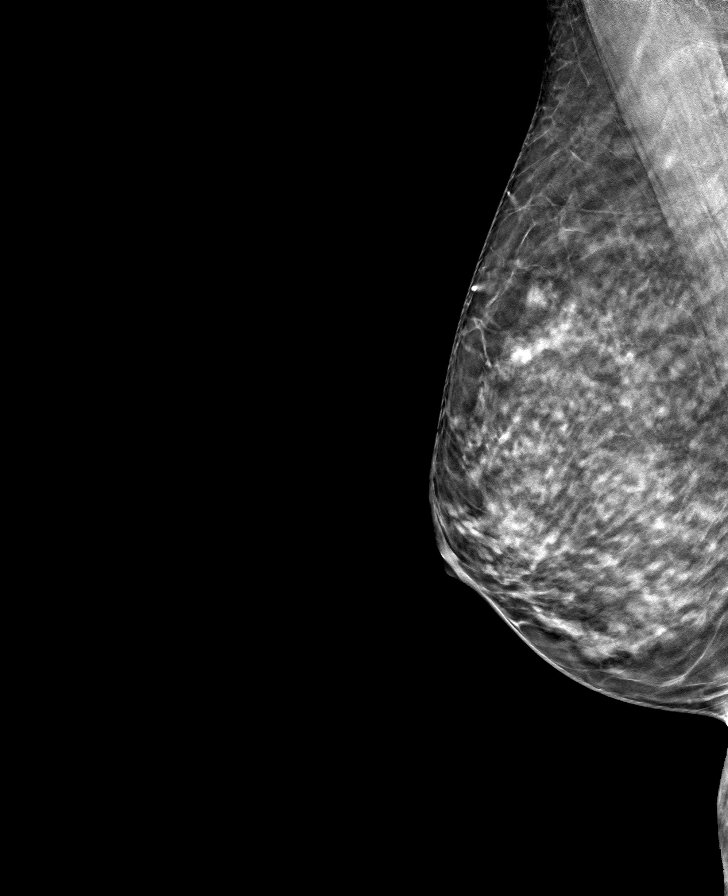

[R CC tomo · tomo slice 30/59.0]
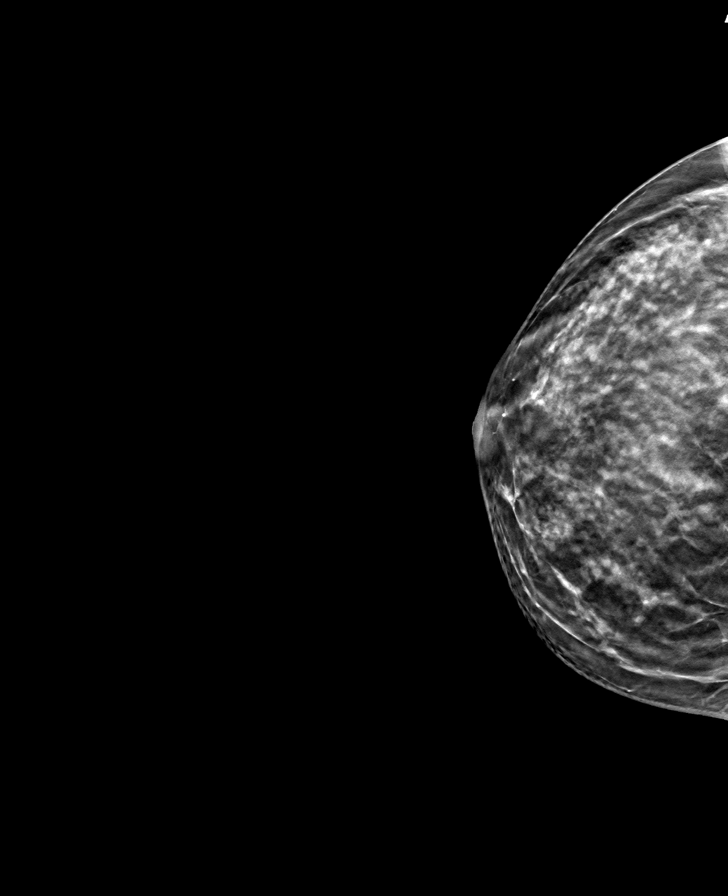

[L MLO tomo · tomo slice 30/59.0]
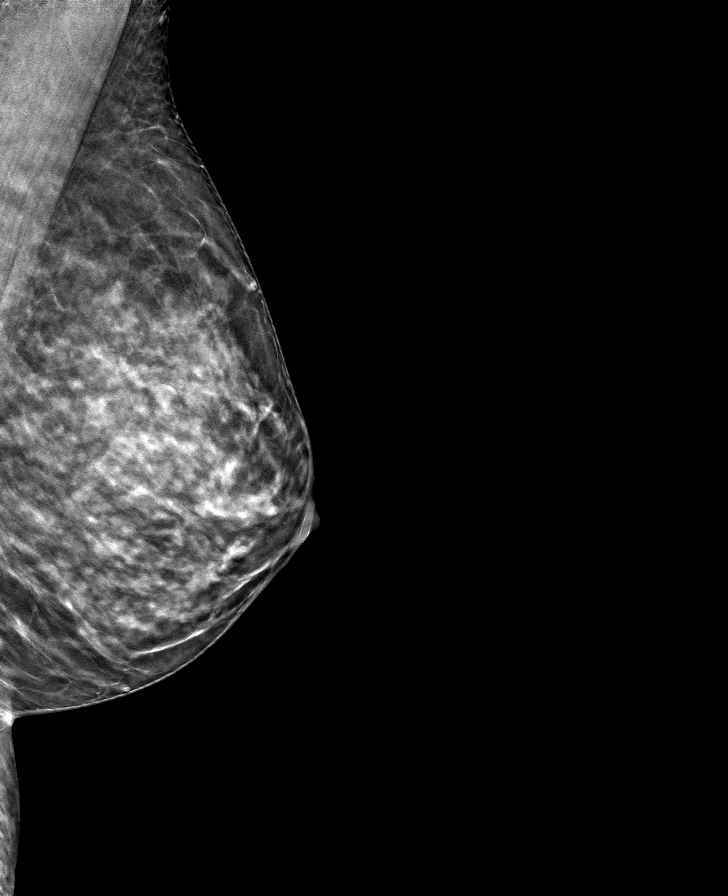

[L CC tomo · tomo slice 32/63.0]
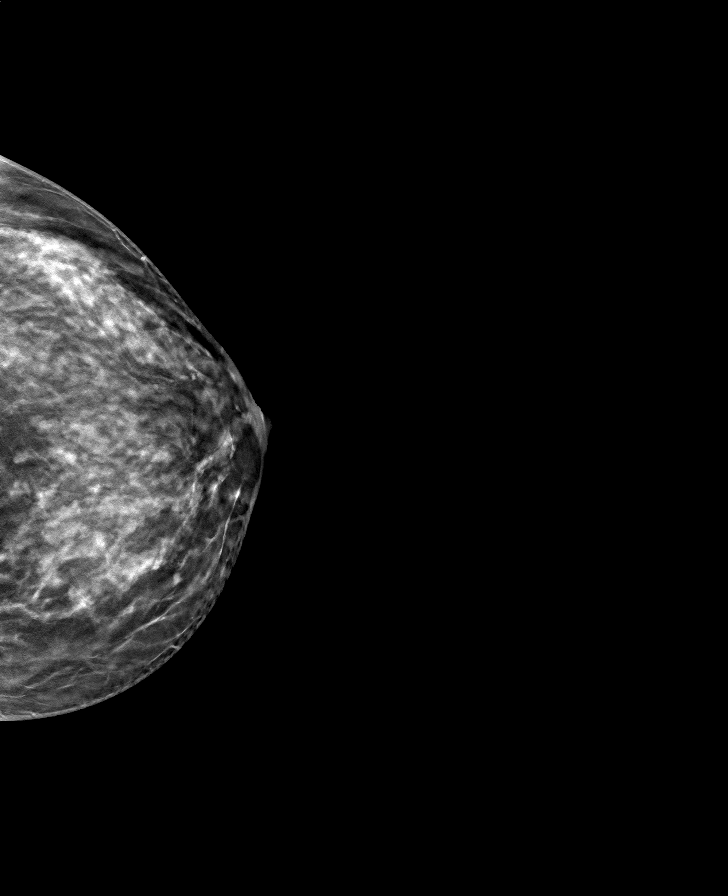

[8 of 24 positions shown; findings below may reference images not displayed]

ACR Breast Density Category c: The breast tissue is heterogeneously
dense, which may obscure small masses
FINDINGS: There are no findings suspicious for malignancy. Images were
processed with CAD.
IMPRESSION: No mammographic evidence of malignancy. A result letter of this
screening mammogram will be mailed directly to the patient.

RECOMMENDATION:
Screening mammogram in one year. (Code:EM-2-IHY)

BI-RADS CATEGORY  1: Negative.

## 2020-08-21 ENCOUNTER — Other Ambulatory Visit: Payer: Self-pay | Admitting: Psychiatry

## 2020-08-28 ENCOUNTER — Ambulatory Visit (INDEPENDENT_AMBULATORY_CARE_PROVIDER_SITE_OTHER): Payer: Medicare HMO | Admitting: Obstetrics and Gynecology

## 2020-08-28 ENCOUNTER — Encounter: Payer: Self-pay | Admitting: Obstetrics and Gynecology

## 2020-08-28 ENCOUNTER — Other Ambulatory Visit: Payer: Self-pay

## 2020-08-28 VITALS — BP 116/74 | Ht 66.0 in | Wt 158.0 lb

## 2020-08-28 DIAGNOSIS — Z9289 Personal history of other medical treatment: Secondary | ICD-10-CM

## 2020-08-28 DIAGNOSIS — Z01419 Encounter for gynecological examination (general) (routine) without abnormal findings: Secondary | ICD-10-CM

## 2020-08-28 DIAGNOSIS — Z13220 Encounter for screening for lipoid disorders: Secondary | ICD-10-CM | POA: Diagnosis not present

## 2020-08-28 DIAGNOSIS — E785 Hyperlipidemia, unspecified: Secondary | ICD-10-CM | POA: Diagnosis not present

## 2020-08-28 LAB — COMPREHENSIVE METABOLIC PANEL
AG Ratio: 2.1 (calc) (ref 1.0–2.5)
ALT: 12 U/L (ref 6–29)
AST: 15 U/L (ref 10–35)
Albumin: 4.4 g/dL (ref 3.6–5.1)
Alkaline phosphatase (APISO): 53 U/L (ref 31–125)
BUN: 12 mg/dL (ref 7–25)
CO2: 23 mmol/L (ref 20–32)
Calcium: 9 mg/dL (ref 8.6–10.2)
Chloride: 104 mmol/L (ref 98–110)
Creat: 0.73 mg/dL (ref 0.50–1.10)
Globulin: 2.1 g/dL (calc) (ref 1.9–3.7)
Glucose, Bld: 94 mg/dL (ref 65–99)
Potassium: 4.8 mmol/L (ref 3.5–5.3)
Sodium: 135 mmol/L (ref 135–146)
Total Bilirubin: 0.4 mg/dL (ref 0.2–1.2)
Total Protein: 6.5 g/dL (ref 6.1–8.1)

## 2020-08-28 LAB — CBC
HCT: 38.8 % (ref 35.0–45.0)
Hemoglobin: 12.9 g/dL (ref 11.7–15.5)
MCH: 31.5 pg (ref 27.0–33.0)
MCHC: 33.2 g/dL (ref 32.0–36.0)
MCV: 94.9 fL (ref 80.0–100.0)
MPV: 10.5 fL (ref 7.5–12.5)
Platelets: 314 10*3/uL (ref 140–400)
RBC: 4.09 10*6/uL (ref 3.80–5.10)
RDW: 11.8 % (ref 11.0–15.0)
WBC: 6.4 10*3/uL (ref 3.8–10.8)

## 2020-08-28 LAB — LIPID PANEL
Cholesterol: 185 mg/dL (ref <200)
HDL: 63 mg/dL (ref >=50)
LDL Cholesterol (Calc): 106 mg/dL (calc) — ABNORMAL HIGH
Non-HDL Cholesterol (Calc): 122 mg/dL (calc) (ref <130)
Total CHOL/HDL Ratio: 2.9 (calc) (ref <5.0)
Triglycerides: 70 mg/dL (ref <150)

## 2020-08-28 NOTE — Progress Notes (Signed)
Jhoanna Heyde May 13, 1976 [id number]  SUBJECTIVE:  44 y.o. G0P0000 female for annual routine gynecologic exam. She has no gynecologic concerns.  Current Outpatient Medications  Medication Sig Dispense Refill  . cyclobenzaprine (FLEXERIL) 10 MG tablet TAKE 2 TABLETS BY MOUTH EVERY NIGHT AT BEDTIME 60 tablet 0  . divalproex (DEPAKOTE ER) 500 MG 24 hr tablet TAKE 2 TABLETS(1000 MG) BY MOUTH AT BEDTIME 180 tablet 1  . doxazosin (CARDURA) 4 MG tablet Take 1.5 tablets (6 mg total) by mouth at bedtime. 135 tablet 1  . gabapentin (NEURONTIN) 400 MG capsule TAKE ONE CAPSULE BY MOUTH TWICE A DAY AND TAKE TWO CAPSULES BY MOUTH IN THE EVENING 120 capsule 3  . lamoTRIgine (LAMICTAL) 200 MG tablet TAKE 1 AND 1/2 TABLETS(300 MG) BY MOUTH DAILY 135 tablet 0  . LORazepam (ATIVAN) 1 MG tablet TAKE ONE TABLET BY MOUTH FOUR TIMES A DAY 120 tablet 1  . rOPINIRole (REQUIP) 2 MG tablet Take 2 mg at 5 pm.  Take in addition to 8mg  at bedtime. 30 tablet 5  . rOPINIRole (REQUIP) 4 MG tablet TAKE TWO TABLETS BY MOUTH EVERY NIGHT AT BEDTIME 60 tablet 3  . sertraline (ZOLOFT) 100 MG tablet Take 1 tablet (100 mg total) by mouth daily. 90 tablet 1  . valACYclovir (VALTREX) 500 MG tablet Take one tablet by mouth twice daily for 3-5 days then daily as needed. 90 tablet 4  . zolpidem (AMBIEN) 10 MG tablet TAKE ONE TABLET BY MOUTH AT BEDTIME AS NEEDED FOR SLEEP 30 tablet 2   No current facility-administered medications for this visit.   Allergies: Patient has no known allergies.  Patient's last menstrual period was 08/10/2020.  Past medical history,surgical history, problem list, medications, allergies, family history and social history were all reviewed and documented as reviewed in the EPIC chart.  ROS:  pertinent positives and negatives as outlined in HPI   OBJECTIVE:  BP 116/74   Ht 5\' 6"  (1.676 m)   Wt 158 lb (71.7 kg)   LMP 08/10/2020   BMI 25.50 kg/m  The patient appears well, alert, oriented, in no  distress. Abdomen soft without tenderness, guarding, mass or organomegaly.  Neurological is normal, no focal findings.  BREAST EXAM: breasts appear normal, no suspicious masses, no skin or nipple changes or axillary nodes  PELVIC EXAM: VULVA: normal appearing vulva with no masses, tenderness or lesions, VAGINA: normal appearing vagina with normal color and discharge, no lesions, CERVIX: normal appearing cervix without discharge or lesions, UTERUS: uterus is normal size, shape, consistency and nontender, ADNEXA: normal adnexa in size, nontender and no masses  Chaperone: Aurora Mask (DNP student) present during the examination and performed the pelvic exam with me in attendance to confirm the exam findings   ASSESSMENT:  44 y.o. G0P0000 here for annual gynecologic exam  PLAN:   1. Not desirous of contraception.  Would accept pregnancy if it occurred, she feels this is unlikely due to infertility history and age.  Discussed not a guarantee against possibility of conception. 2. Mammography overdue and patient will call to schedule.  Breast exam normal today. 3. Pap smear/HPV 2018.  No Pap smear done today.  No history of abnormal Pap smears.  Plan repeat Pap smear/HPV at 5-year interval per current screening guidelines. 4. History of HSV.  No recent outbreaks.  Has supply of Valtrex and uses intermittently as needed.  Will notify us when she needs more. 5. Health maintenance.  Baseline CBC, CMP and lipid profile ordered.  Return annually or sooner, prn.  Joseph Pierini MD 08/28/20

## 2020-08-30 ENCOUNTER — Other Ambulatory Visit: Payer: Self-pay | Admitting: Neurology

## 2020-09-01 ENCOUNTER — Other Ambulatory Visit: Payer: Self-pay | Admitting: Neurology

## 2020-09-03 ENCOUNTER — Other Ambulatory Visit: Payer: Self-pay | Admitting: Neurology

## 2020-09-03 ENCOUNTER — Other Ambulatory Visit: Payer: Self-pay

## 2020-09-03 DIAGNOSIS — G2581 Restless legs syndrome: Secondary | ICD-10-CM

## 2020-09-03 MED ORDER — CYCLOBENZAPRINE HCL 10 MG PO TABS
20.0000 mg | ORAL_TABLET | Freq: Every day | ORAL | 3 refills | Status: DC
Start: 2020-09-03 — End: 2021-01-29

## 2020-09-03 NOTE — Telephone Encounter (Signed)
Patient called to check on the status of this.

## 2020-09-03 NOTE — Telephone Encounter (Signed)
Refilled at [facility] with 3 additional refills.

## 2020-09-03 NOTE — Progress Notes (Signed)
Per pt, last referral did not work please send again.

## 2020-09-03 NOTE — Telephone Encounter (Signed)
Patient called in and left a message. She stated she tried to have her cyclobenzaprine refilled and that refill was denied. She is not sure why. She is not having new or worsening symptoms, but would like a refill.

## 2020-09-16 ENCOUNTER — Other Ambulatory Visit: Payer: Self-pay | Admitting: Psychiatry

## 2020-09-16 DIAGNOSIS — F315 Bipolar disorder, current episode depressed, severe, with psychotic features: Secondary | ICD-10-CM

## 2020-09-17 NOTE — Telephone Encounter (Signed)
Please review

## 2020-10-02 ENCOUNTER — Ambulatory Visit (INDEPENDENT_AMBULATORY_CARE_PROVIDER_SITE_OTHER): Payer: Medicare HMO | Admitting: Psychiatry

## 2020-10-02 ENCOUNTER — Encounter: Payer: Self-pay | Admitting: Psychiatry

## 2020-10-02 ENCOUNTER — Other Ambulatory Visit: Payer: Self-pay

## 2020-10-02 DIAGNOSIS — F515 Nightmare disorder: Secondary | ICD-10-CM

## 2020-10-02 DIAGNOSIS — F4312 Post-traumatic stress disorder, chronic: Secondary | ICD-10-CM | POA: Diagnosis not present

## 2020-10-02 DIAGNOSIS — F99 Mental disorder, not otherwise specified: Secondary | ICD-10-CM | POA: Diagnosis not present

## 2020-10-02 DIAGNOSIS — F5105 Insomnia due to other mental disorder: Secondary | ICD-10-CM

## 2020-10-02 DIAGNOSIS — F315 Bipolar disorder, current episode depressed, severe, with psychotic features: Secondary | ICD-10-CM

## 2020-10-02 DIAGNOSIS — F4310 Post-traumatic stress disorder, unspecified: Secondary | ICD-10-CM | POA: Diagnosis not present

## 2020-10-02 DIAGNOSIS — F411 Generalized anxiety disorder: Secondary | ICD-10-CM | POA: Diagnosis not present

## 2020-10-02 DIAGNOSIS — G2581 Restless legs syndrome: Secondary | ICD-10-CM

## 2020-10-02 MED ORDER — GABAPENTIN 400 MG PO CAPS
ORAL_CAPSULE | ORAL | 3 refills | Status: DC
Start: 2020-10-02 — End: 2021-02-21

## 2020-10-02 MED ORDER — LORAZEPAM 1 MG PO TABS: 1.0000 mg | ORAL_TABLET | Freq: Four times a day (QID) | ORAL | 3 refills | Status: DC

## 2020-10-02 MED ORDER — ROPINIROLE HCL 4 MG PO TABS: 8.0000 mg | ORAL_TABLET | Freq: Every day | ORAL | 5 refills | Status: DC

## 2020-10-02 MED ORDER — SERTRALINE HCL 100 MG PO TABS: 100.0000 mg | ORAL_TABLET | Freq: Every day | ORAL | 1 refills | Status: DC

## 2020-10-02 MED ORDER — ROPINIROLE HCL 2 MG PO TABS: ORAL_TABLET | ORAL | 5 refills | Status: DC

## 2020-10-02 MED ORDER — LAMOTRIGINE 200 MG PO TABS: 300.0000 mg | ORAL_TABLET | Freq: Every day | ORAL | 1 refills | Status: DC

## 2020-10-02 MED ORDER — ZOLPIDEM TARTRATE 10 MG PO TABS: 10.0000 mg | ORAL_TABLET | Freq: Every evening | ORAL | 4 refills | Status: DC | PRN

## 2020-10-02 MED ORDER — DIVALPROEX SODIUM ER 500 MG PO TB24: ORAL_TABLET | ORAL | 1 refills | Status: DC

## 2020-10-02 MED ORDER — DOXAZOSIN MESYLATE 4 MG PO TABS: 6.0000 mg | ORAL_TABLET | Freq: Every evening | ORAL | 1 refills | Status: DC

## 2020-10-02 NOTE — Progress Notes (Signed)
Beth [name] [id number] [birth year] 44 y.o.  Subjective:   Patient ID:  Beth [name] is a 44 y.o. (DOB 01-19-1976) female.  Chief Complaint:  Chief Complaint  Patient presents with  . Follow-up  . Depression  . Anxiety  . Sleeping Problem    HPI [name] presents to the office today for follow-up of the below.  [name] presents to the office today for follow-up of TRD bipolar unstable which is difficult to treat due to med sensitivity and sleep problems.    seen August 25 , 2020.  Encouraged her to split the dose of Depakote ER 500 mg twice daily and get it away from bedtime to see if nausea and vomiting will improve.  She was encouraged to stop caffeine after noon because of insomnia and we switched from Ambien CR to regular Ambien 10 mg. Doing OK overall.    seen November 08, 2019.  She was still having some issues with sleep but was drinking caffeine too late and she was encouraged to stop caffeine after noon.  No other meds were changed.  seen January 18, 2020.  The following was noted: Not good.  Uncle living with them died 2023/04/04.  Had heart problems and apparent MI. Before that was relatively OK.  With new insurance can get generic Saphris for $24 .  It helped her sleep so well.  Doesn't remember how it worked for mood.  But wants to restart it for the sleep benefit and potential mood benefit.  Prior to his death mood swings were OK and anxiety manageable but it won't be that way now.  Has a lot more anxiety and depression.  Crying all the time.  Stressed over it.  She was to start Saphris 5 mg nightly and go up to 10 mg nightly if needed.[name]  April 23rd 2021 appointment, the following is noted: Took one Saphris and had bad RLS the rest of the night.  Asked to increase doxazosin bc NM of uncles death. On Sinemet  mos per neuro. Meds for RLS changed by doctor and it's better.No dizziness with doxazosin.  Reducing caffeine helped RLS. NM mostly controlled...  Taking ambien 10 and doxazosin. Doxazosin helped NM. Had NM of F every night and would interfere. Tired of dreaming of him. They stopped.. Saphris helped her go to sleep. Before it couldn't get to sleep. Now average 5 hours sleep. No naps. No RLS right now with ropinirole better than pramipexole. Insurance change demand she stop Saphris, Latuda, loxapine is $100/month.  She looked up what she can afford risperidone, Geodon, haloperidol, Tegretol  She can't afford Vraylar. Current stressors $, Plan: pt wanted to increase doxazosin to 6 mg HS for residual NM.  06/06/20 appt with the following noted: Tolerating meds.   NM controlled.  RLS is still in evening and not much at night. Had good realtionship with father who died 3 years ago.  Had good relationship with uncle who died late 01/31/20. Neurontin and Requip help most of all the meds.   Asked about weight gain with Depakote. Sleep is better with better schedule with husband's job. Overall mood is pretty stable except anniversary events.  10/02/20 appt with the following noted: Don't like Depakote bc more nausea with it.  Starts at night after it.  Takes over an hour to get to sleep.  N gone in the morning.   No RLS in bed but sometimes in the evening.  Mood and hallucinations under control.  Anxiety sometimes and  occ panic. Sleep chronic issues.    Helps with sister's kids 3 times weekly.  Remote SI but did not hurt herself, was hospitalized.  Prior psychiatric medication trials include Latuda  restless legs,  Fanapt which cause restless legs,  Loxapine weight,  Abilfiy 10,  Saphris which caused weight gain & RLS, Liked saphris some. olanzapine weight gain,  Geodon,  Seroquel weight gain and restless legs,  risperidone restless legs,  Haloperidol 2 RLS,    loxapine cost and weight,  Latuda helped mood but can't afford. Sinemet, gabapentin. Mirapex, ropinirole Lyrica weight gain, lithium side effects,  lamotrigine, Equetro with mildly elevated liver enzymes,  Lexapro, duloxetine, venlafaxine, bupropion, fluoxetine, sertraline,    doxepin ineffective, Restoril lost effect,  Ambien, mirtazapine increased appetite, trazodone restlessness,,   Review of Systems:  Review of Systems  Cardiovascular: Negative for chest pain and palpitations.  Gastrointestinal: Positive for nausea.  Neurological: Negative for dizziness, tremors and weakness.    Medications: I have reviewed the patient's current medications.  Current Outpatient Medications  Medication Sig Dispense Refill  . cyclobenzaprine (FLEXERIL) 10 MG tablet Take 2 tablets (20 mg total) by mouth at bedtime. 60 tablet 3  . divalproex (DEPAKOTE ER) 500 MG 24 hr tablet TAKE 2 TABLETS(1000 MG) BY MOUTH AT BEDTIME 180 tablet 1  . doxazosin (CARDURA) 4 MG tablet Take 1.5 tablets (6 mg total) by mouth at bedtime. 135 tablet 1  . gabapentin (NEURONTIN) 400 MG capsule TAKE ONE CAPSULE BY MOUTH TWICE A DAY AND TAKE TWO CAPSULES BY MOUTH IN THE EVENING 120 capsule 3  . lamoTRIgine (LAMICTAL) 200 MG tablet Take 1.5 tablets (300 mg total) by mouth daily. 135 tablet 1  . LORazepam (ATIVAN) 1 MG tablet Take 1 tablet (1 mg total) by mouth 4 (four) times daily. 120 tablet 3  . rOPINIRole (REQUIP) 2 MG tablet Take 2 mg at 5 pm.  Take in addition to 8mg  at bedtime. 30 tablet 5  . rOPINIRole (REQUIP) 4 MG tablet Take 2 tablets (8 mg total) by mouth at bedtime. 60 tablet 5  . sertraline (ZOLOFT) 100 MG tablet Take 1 tablet (100 mg total) by mouth daily. 90 tablet 1  . valACYclovir (VALTREX) 500 MG tablet Take one tablet by mouth twice daily for 3-5 days then daily as needed. 90 tablet 4  . zolpidem (AMBIEN) 10 MG tablet Take 1 tablet (10 mg total) by mouth at bedtime as needed. for sleep 30 tablet 4   No current facility-administered medications for this visit.    Medication Side Effects: None  Allergies: No Known Allergies  Past Medical History:   Diagnosis Date  . Bipolar disorder ([place])   . HSV (herpes simplex virus) anogenital infection 05/2018  . RLS (restless legs syndrome)     Family History  Problem Relation Age of Onset  . Thyroid disease Mother   . COPD Mother   . Bipolar disorder Mother   . Parkinson's disease Father   . Breast cancer Paternal Grandmother 56  . Bipolar disorder Sister   . Schizophrenia Other     Social History   Socioeconomic History  . Marital status: Married    Spouse name: Not on file  . Number of children: 0  . Years of education: 108  . Highest education level: Associate degree: academic program  Occupational History  . Occupation: unemployed  Tobacco Use  . Smoking status: Former Research scientist (life sciences)  . Smokeless tobacco: Never Used  Vaping Use  . Vaping Use: Never used  Substance and Sexual Activity  . Alcohol use: No  . Drug use: No  . Sexual activity: Yes    Birth control/protection: None    Comment: 1st intercourse 64 yo-5 partners  Other Topics Concern  . 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    Social Determinants of Health   Financial Resource Strain:   . Difficulty of Paying Living Expenses: Not on file  Food Insecurity:   . Worried About Charity fundraiser in the Last Year: Not on file  . Ran Out of Food in the Last Year: Not on file  Transportation Needs:   . Lack of Transportation (Medical): Not on file  . Lack of Transportation (Non-Medical): Not on file  Physical Activity:   . Days of Exercise per Week: Not on file  . Minutes of Exercise per Session: Not on file  Stress:   . Feeling of Stress : Not on file  Social Connections:   . Frequency of Communication with Friends and Family: Not on file  . Frequency of Social Gatherings with Friends and Family: Not on file  . Attends Religious Services: Not on file   . Active Member of Clubs or Organizations: Not on file  . Attends Archivist Meetings: Not on file  . Marital Status: Not on file  Intimate Partner Violence:   . Fear of Current or Ex-Partner: Not on file  . Emotionally Abused: Not on file  . Physically Abused: Not on file  . Sexually Abused: Not on file    Past Medical History, Surgical history, Social history, and Family history were reviewed and updated as appropriate.   Please see review of systems for further details on the patient's review from today.   Objective:   Physical Exam:  There were no vitals taken for this visit.  Physical Exam Constitutional:      General: She is not in acute distress. Musculoskeletal:        General: No deformity.  Neurological:     Mental Status: She is alert and oriented to person, place, and time.     Coordination: Coordination normal.  Psychiatric:        Attention and Perception: Attention and perception normal. She does not perceive auditory or visual hallucinations.        Mood and Affect: Mood is anxious. Mood is not depressed. Affect is not labile, blunt, angry or inappropriate.        Speech: Speech normal.        Behavior: Behavior normal.        Thought Content: Thought content normal. Thought content is not paranoid or delusional. Thought content does not include homicidal or suicidal ideation. Thought content does not include homicidal or suicidal plan.        Cognition and Memory: Cognition and memory normal.        Judgment: Judgment normal.     Comments: Insight intact Patient has varying levels of depression and anxiety but overall is better in the last few months than when first seen.     Lab Review:     Component Value Date/Time   NA 135 08/28/2020 1128   K 4.8 08/28/2020 1128   CL 104 08/28/2020 1128   CO2 23 08/28/2020 1128   GLUCOSE 94 08/28/2020  1128   BUN 12 08/28/2020 1128   CREATININE 0.73 08/28/2020 1128   CALCIUM 9.0 08/28/2020 1128   PROT  6.5 08/28/2020 1128   ALBUMIN 4.7 03/20/2017 1100   AST 15 08/28/2020 1128   ALT 12 08/28/2020 1128   ALKPHOS 65 03/20/2017 1100   BILITOT 0.4 08/28/2020 1128       Component Value Date/Time   WBC 6.4 08/28/2020 1128   RBC 4.09 08/28/2020 1128   HGB 12.9 08/28/2020 1128   HCT 38.8 08/28/2020 1128   PLT 314 08/28/2020 1128   MCV 94.9 08/28/2020 1128   MCH 31.5 08/28/2020 1128   MCHC 33.2 08/28/2020 1128   RDW 11.8 08/28/2020 1128   LYMPHSABS 2,281 08/25/2019 1114   MONOABS 570 03/20/2017 1100   EOSABS 435 08/25/2019 1114   BASOSABS 101 08/25/2019 1114    No results found for: POCLITH, LITHIUM   No results found for: PHENYTOIN, PHENOBARB, VALPROATE, CBMZ   .res Assessment: Plan:    Ophelia was seen today for follow-up, depression, anxiety and sleeping problem.  Diagnoses and all orders for this visit:  Bipolar disorder, current episode depressed, severe, with psychotic features ([place]) -     sertraline (ZOLOFT) 100 MG tablet; Take 1 tablet (100 mg total) by mouth daily. -     lamoTRIgine (LAMICTAL) 200 MG tablet; Take 1.5 tablets (300 mg total) by mouth daily. -     divalproex (DEPAKOTE ER) 500 MG 24 hr tablet; TAKE 2 TABLETS(1000 MG) BY MOUTH AT BEDTIME  PTSD (post-traumatic stress disorder) -     sertraline (ZOLOFT) 100 MG tablet; Take 1 tablet (100 mg total) by mouth daily. -     LORazepam (ATIVAN) 1 MG tablet; Take 1 tablet (1 mg total) by mouth 4 (four) times daily. -     doxazosin (CARDURA) 4 MG tablet; Take 1.5 tablets (6 mg total) by mouth at bedtime.  Generalized anxiety disorder -     sertraline (ZOLOFT) 100 MG tablet; Take 1 tablet (100 mg total) by mouth daily. -     LORazepam (ATIVAN) 1 MG tablet; Take 1 tablet (1 mg total) by mouth 4 (four) times daily.  Insomnia due to other mental disorder -     zolpidem (AMBIEN) 10 MG tablet; Take 1 tablet (10 mg total) by mouth at bedtime as needed. for sleep  RLS (restless legs syndrome) -     rOPINIRole (REQUIP) 4 MG  tablet; Take 2 tablets (8 mg total) by mouth at bedtime. -     rOPINIRole (REQUIP) 2 MG tablet; Take 2 mg at 5 pm.  Take in addition to 8mg  at bedtime. -     doxazosin (CARDURA) 4 MG tablet; Take 1.5 tablets (6 mg total) by mouth at bedtime.  Nightmares associated with chronic post-traumatic stress disorder -     doxazosin (CARDURA) 4 MG tablet; Take 1.5 tablets (6 mg total) by mouth at bedtime.  Other orders -     gabapentin (NEURONTIN) 400 MG capsule; TAKE ONE CAPSULE BY MOUTH TWICE A DAY AND TAKE TWO CAPSULES BY MOUTH IN THE EVENING    Greater than 50% of face to face time with patient was spent on counseling and coordination of care. We discussed the following: Kitiara has treatment resistant bipolar disorder with psychotic features versus schizoaffective disorder and other psychiatric diagnoses as well.  She has been very difficult to treat in part because she is very sensitive to antipsychotics and yet has chronic auditory hallucinations.    She is  chronically anxious and depressed but the level of depression was not severe with depression being mild and anxiety being moderate.  Extensive history of psychiatric meds tried discussed with the patient.   Overall patient is easily overwhelmed with stress.  TR insomnia chronically.  Option Dayvigo, Belsomra if changing the timing of Ambien does not work she can try the samples with the Ambien if necessary.  She has chronic insomnia which is difficult to manage and she will have rebound and insomnia if she attempts to stop the Ambien abruptly.  Discussed Ambien amnesia and side effects. No change in sleep meds this visit  Lately her auditory hallucinations have stopped.  But we can still consider if needed, Clozapine option.      Option retry CBZ and follow liver enzymes as they were only mildly elevated.Disc DDI complications of CBZ and would need to check liver enzymes DT past history of mild elevations.  There are a lot of medication  interactions.  This is the only option that won't worsen RLS except clozapine.    Nausea and vomiting resolved by splitting the Depakote dosage but then she started taking it together and having nausea again. She still is concerned about potential weight gain from this med and we discussed this in detail.  Emotionally she is doing better than usual and I would prefer not to change meds given that the only reasonable alternative would be carbamazepine. Split Depakote to resolve nausea ER 500 mg BID.  RLS managed and neuro checked iron studies reportedly ok.   The increase to 6 mg HS doxazosin was successful at managing nightmares.  No  Caffeine after noon.  This is helped.  04/18/20 appt with neuro about RLS.    Iron studies were normal.  The neurologist suggested referral to a movement disorder specialist.  We discussed at length that there are occasions in which going too high with a dopamine agonist can actually worsen restless legs rather than improve it.  She is at a high dose of ropinirole.  She does not want to go to see another doctor about this at this time.  Given that situation it was suggested that she try reducing the evening dose of ropinirole to 6 mg from 8 mg given that her restless legs is only a problem in the evening before she goes to bed and not after.  Zolpidem 10 mg 1 nightly.  Couldn't live without it.  Disc taking on empty stomach and how to deal with it if food on stomach.  Disc withdrawal from Ambien.  Discussed side effects including amnesia.  continue Gabapentin for off label anxiety and RLS at previous dosage.  400 BID and 800 pm.  This has been helpful though has not resolve the problem.  The prognosis for improvement is very guarded from the use of medication.  She is very prone to EPS or restless legs from medications.    Discussed the risk of polypharmacy.  She is on multiple medications.  No med change today.  Follow-up 4 months  Lynder Parents MD,  DFAPA Please see After Visit Summary for patient specific instructions.  No future appointments.  No orders of the defined types were placed in this encounter.   -------------------------------

## 2020-11-23 ENCOUNTER — Other Ambulatory Visit: Payer: Self-pay | Admitting: Neurology

## 2020-11-23 DIAGNOSIS — G2581 Restless legs syndrome: Secondary | ICD-10-CM

## 2020-12-20 ENCOUNTER — Telehealth: Payer: Self-pay | Admitting: Neurology

## 2020-12-20 ENCOUNTER — Other Ambulatory Visit: Payer: Self-pay | Admitting: Neurology

## 2020-12-20 NOTE — Telephone Encounter (Signed)
At this point, I am not treating her restless leg.  It looks like Dr. Jennelle Human has been treating her restless leg.  Unfortunately, I was not able to successfully treat her restless leg and I do not believe I have more to offer.  I just can't have her continue to see me just for refills of Flexeril.  I would recommend seeing if her PCP can continue refills or if Dr. Jennelle Human would continue refills as he was treating her restless leg

## 2020-12-20 NOTE — Telephone Encounter (Signed)
Telephone call to pt, Advised pt referral sent to [facility] in May 2021 and September 2021.  Per pt her RLS is more controled now. Pt would like to continue seeing Dr.Jaffe and feels there is no need for the referral.

## 2020-12-20 NOTE — Telephone Encounter (Signed)
Patient called to find out why her Flexeril 10 MG was denied for refills.   She also said, "I never received a referral to a movement specialist for my RLS. I was only referred to a sleep specialist. Please send this back for review again."

## 2020-12-21 NOTE — Telephone Encounter (Signed)
Pt advised of Dr.Jaffe note below.   Before I could finish letting pt what the notes stated pt hung up the phone.

## 2021-01-02 DIAGNOSIS — Z1152 Encounter for screening for COVID-19: Secondary | ICD-10-CM | POA: Diagnosis not present

## 2021-01-07 ENCOUNTER — Telehealth: Payer: Self-pay | Admitting: Neurology

## 2021-01-07 NOTE — Telephone Encounter (Signed)
Patient called in wanting to see about getting a referral to [facility].

## 2021-01-07 NOTE — Telephone Encounter (Signed)
The referral should come from her PCP or her psychiatrist who has been managing her RLS

## 2021-01-07 NOTE — Telephone Encounter (Signed)
Telephone call to pt, Advise pt she has to ask her PCP or her psychiatrist for a referral for ALS.   Pt states "WHATEVER" and hangs up the phone.

## 2021-01-07 NOTE — Telephone Encounter (Signed)
Sorry RLS.

## 2021-01-08 ENCOUNTER — Telehealth: Payer: Self-pay | Admitting: Psychiatry

## 2021-01-08 DIAGNOSIS — Z20822 Contact with and (suspected) exposure to covid-19: Secondary | ICD-10-CM | POA: Diagnosis not present

## 2021-01-08 DIAGNOSIS — Z03818 Encounter for observation for suspected exposure to other biological agents ruled out: Secondary | ICD-10-CM | POA: Diagnosis not present

## 2021-01-08 DIAGNOSIS — J018 Other acute sinusitis: Secondary | ICD-10-CM | POA: Diagnosis not present

## 2021-01-08 NOTE — Telephone Encounter (Signed)
Pt called and needs a referal sent to [facility] neurology for her restless legs. She was going to [place] neurology but they are not going to see her anymore. She has an appt on 01/29/21

## 2021-01-29 ENCOUNTER — Other Ambulatory Visit: Payer: Self-pay

## 2021-01-29 ENCOUNTER — Ambulatory Visit (INDEPENDENT_AMBULATORY_CARE_PROVIDER_SITE_OTHER): Payer: Medicare HMO | Admitting: Psychiatry

## 2021-01-29 ENCOUNTER — Encounter: Payer: Self-pay | Admitting: Psychiatry

## 2021-01-29 DIAGNOSIS — F315 Bipolar disorder, current episode depressed, severe, with psychotic features: Secondary | ICD-10-CM | POA: Diagnosis not present

## 2021-01-29 DIAGNOSIS — F5105 Insomnia due to other mental disorder: Secondary | ICD-10-CM | POA: Diagnosis not present

## 2021-01-29 DIAGNOSIS — F4310 Post-traumatic stress disorder, unspecified: Secondary | ICD-10-CM

## 2021-01-29 DIAGNOSIS — G2581 Restless legs syndrome: Secondary | ICD-10-CM

## 2021-01-29 DIAGNOSIS — F99 Mental disorder, not otherwise specified: Secondary | ICD-10-CM

## 2021-01-29 DIAGNOSIS — F411 Generalized anxiety disorder: Secondary | ICD-10-CM | POA: Diagnosis not present

## 2021-01-29 MED ORDER — ARIPIPRAZOLE 10 MG PO TABS: 10.0000 mg | ORAL_TABLET | Freq: Every day | ORAL | 1 refills | Status: DC

## 2021-01-29 MED ORDER — CYCLOBENZAPRINE HCL 10 MG PO TABS: 20.0000 mg | ORAL_TABLET | Freq: Every day | ORAL | 3 refills | Status: DC

## 2021-01-29 NOTE — Progress Notes (Signed)
Beth [name] [id number] 05-31-1976 45 y.o.  Subjective:   Patient ID:  Beth [name] is a 45 y.o. (DOB June 04, 1976) female.  Chief Complaint:  Chief Complaint  Patient presents with  . Bipolar disorder, current episode depressed, severe, with p  . Follow-up  . Sleeping Problem  . Depression    HPI Beth [name] presents to the office today for follow-up of the below.  Beth [name] presents to the office today for follow-up of TRD bipolar unstable which is difficult to treat due to med sensitivity and sleep problems.    seen August 25 , 2020.  Encouraged her to split the dose of Depakote ER 500 mg twice daily and get it away from bedtime to see if nausea and vomiting will improve.  She was encouraged to stop caffeine after noon because of insomnia and we switched from Ambien CR to regular Ambien 10 mg. Doing OK overall.    seen November 08, 2019.  She was still having some issues with sleep but was drinking caffeine too late and she was encouraged to stop caffeine after noon.  No other meds were changed.  seen January 18, 2020.  The following was noted: Not good.  Uncle living with them died 03-21-2023.  Had heart problems and apparent MI. Before that was relatively OK.  With new insurance can get generic Saphris for $24 .  It helped her sleep so well.  Doesn't remember how it worked for mood.  But wants to restart it for the sleep benefit and potential mood benefit.  Prior to his death mood swings were OK and anxiety manageable but it won't be that way now.  Has a lot more anxiety and depression.  Crying all the time.  Stressed over it.  She was to start Saphris 5 mg nightly and go up to 10 mg nightly if needed.[name]  April 23rd 2021 appointment, the following is noted: Took one Saphris and had bad RLS the rest of the night.  Asked to increase doxazosin bc NM of uncles death. On Sinemet  mos per neuro. Meds for RLS changed by doctor and it's better.No dizziness with doxazosin.   Reducing caffeine helped RLS. NM mostly controlled... Taking ambien 10 and doxazosin. Doxazosin helped NM. Had NM of F every night and would interfere. Tired of dreaming of him. They stopped.. Saphris helped her go to sleep. Before it couldn't get to sleep. Now average 5 hours sleep. No naps. No RLS right now with ropinirole better than pramipexole. Insurance change demand she stop Saphris, Latuda, loxapine is $100/month.  She looked up what she can afford risperidone, Geodon, haloperidol, Tegretol  She can't afford Vraylar. Current stressors $, Plan: pt wanted to increase doxazosin to 6 mg HS for residual NM.  06/06/20 appt with the following noted: Tolerating meds.   NM controlled.  RLS is still in evening and not much at night. Had good realtionship with father who died 3 years ago.  Had good relationship with uncle who died late 2020/01/19. Neurontin and Requip help most of all the meds.   Asked about weight gain with Depakote. Sleep is better with better schedule with husband's job. Overall mood is pretty stable except anniversary events.  10/02/20 appt with the following noted: Don't like Depakote bc more nausea with it.  Starts at night after it.  Takes over an hour to get to sleep.  N gone in the morning.   No RLS in bed but sometimes in the evening.  Mood and  hallucinations under control.  Anxiety sometimes and occ panic. Sleep chronic issues.    Helps with sister's kids 3 times weekly. Plan: no med changes  01/29/2021 appointment with following noted: Not going well.  Neuro Dr. [name] [facility]  dropped her as patient and wouldn't refill muscle relaxer, cyclobenzaprine for RLS.  Stress at home and life broken me down.  Wants to get on an actual mood stabilizer.  Very depressed and cycles into irritability.  Has tried to get into the office sooner. Not hearing voices but SI a lot without intent or plan. Hasn't felt this bad in years. No NM.  Nausea even off  Depakote.  Remote SI but did not hurt herself, was hospitalized.  Prior psychiatric medication trials include Latuda  restless legs,  Fanapt which cause restless legs,  Loxapine weight,  Abilfiy 10,  Saphris which caused weight gain & RLS, Liked saphris some. olanzapine weight gain,  Geodon,  Seroquel weight gain and restless legs,  risperidone restless legs,  Haloperidol 2 RLS,    loxapine cost and weight,  Latuda helped mood but can't afford. Sinemet, gabapentin. Mirapex, ropinirole Lyrica weight gain, lithium side effects, lamotrigine, Equetro with mildly elevated liver enzymes, Depakote nausea Lexapro, duloxetine, venlafaxine, bupropion, fluoxetine, sertraline,    doxepin ineffective, Restoril lost effect,  Ambien, mirtazapine increased appetite, trazodone restlessness,,   Review of Systems:  Review of Systems  Cardiovascular: Negative for chest pain and palpitations.  Gastrointestinal: Positive for nausea.  Neurological: Negative for dizziness, tremors and weakness.       RLS    Medications: I have reviewed the patient's current medications.  Current Outpatient Medications  Medication Sig Dispense Refill  . ARIPiprazole (ABILIFY) 10 MG tablet Take 1 tablet (10 mg total) by mouth daily. 30 tablet 1  . doxazosin (CARDURA) 4 MG tablet Take 1.5 tablets (6 mg total) by mouth at bedtime. 135 tablet 1  . gabapentin (NEURONTIN) 400 MG capsule TAKE ONE CAPSULE BY MOUTH TWICE A DAY AND TAKE TWO CAPSULES BY MOUTH IN THE EVENING 120 capsule 3  . lamoTRIgine (LAMICTAL) 200 MG tablet Take 1.5 tablets (300 mg total) by mouth daily. 135 tablet 1  . LORazepam (ATIVAN) 1 MG tablet Take 1 tablet (1 mg total) by mouth 4 (four) times daily. 120 tablet 3  . rOPINIRole (REQUIP) 2 MG tablet Take 2 mg at 5 pm.  Take in addition to 8mg  at bedtime. 30 tablet 5  . rOPINIRole (REQUIP) 4 MG tablet Take 2 tablets (8 mg total) by mouth at bedtime. 60 tablet 5  . sertraline (ZOLOFT) 100 MG tablet  Take 1 tablet (100 mg total) by mouth daily. 90 tablet 1  . valACYclovir (VALTREX) 500 MG tablet Take one tablet by mouth twice daily for 3-5 days then daily as needed. 90 tablet 4  . zolpidem (AMBIEN) 10 MG tablet Take 1 tablet (10 mg total) by mouth at bedtime as needed. for sleep 30 tablet 4  . cyclobenzaprine (FLEXERIL) 10 MG tablet Take 2 tablets (20 mg total) by mouth at bedtime. 60 tablet 3   No current facility-administered medications for this visit.    Medication Side Effects: nausea  Allergies: No Known Allergies  Past Medical History:  Diagnosis Date  . Bipolar disorder ([place])   . HSV (herpes simplex virus) anogenital infection 05/2018  . RLS (restless legs syndrome)     Family History  Problem Relation Age of Onset  . Thyroid disease Mother   . COPD Mother   . Bipolar  disorder Mother   . Parkinson's disease Father   . Breast cancer Paternal Grandmother 55  . Bipolar disorder Sister   . Schizophrenia Other     Social History   Socioeconomic History  . Marital status: Married    Spouse name: Not on file  . Number of children: 0  . Years of education: 60  . Highest education level: Associate degree: academic program  Occupational History  . Occupation: unemployed  Tobacco Use  . Smoking status: Former Research scientist (life sciences)  . Smokeless tobacco: Never Used  Vaping Use  . Vaping Use: Never used  Substance and Sexual Activity  . Alcohol use: No  . Drug use: No  . Sexual activity: Yes    Birth control/protection: None    Comment: 1st intercourse 61 yo-5 partners  Other Topics Concern  . 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    Social Determinants of Health   Financial Resource Strain: Not on file  Food Insecurity: Not on file  Transportation Needs: Not on file  Physical Activity: Not on file  Stress: Not  on file  Social Connections: Not on file  Intimate Partner Violence: Not on file    Past Medical History, Surgical history, Social history, and Family history were reviewed and updated as appropriate.   Please see review of systems for further details on the patient's review from today.   Objective:   Physical Exam:  There were no vitals taken for this visit.  Physical Exam Constitutional:      General: She is not in acute distress. Musculoskeletal:        General: No deformity.  Neurological:     Mental Status: She is alert and oriented to person, place, and time.     Coordination: Coordination normal.  Psychiatric:        Attention and Perception: Attention and perception normal. She does not perceive auditory or visual hallucinations.        Mood and Affect: Mood is anxious. Mood is not depressed. Affect is not labile, blunt, angry or inappropriate.        Speech: Speech normal.        Behavior: Behavior normal.        Thought Content: Thought content normal. Thought content is not paranoid or delusional. Thought content does not include homicidal or suicidal ideation. Thought content does not include homicidal or suicidal plan.        Cognition and Memory: Cognition and memory normal.        Judgment: Judgment normal.     Comments: Insight intact Patient has varying levels of depression and anxiety but overall is better in the last few months than when first seen.     Lab Review:     Component Value Date/Time   NA 135 08/28/2020 1128   K 4.8 08/28/2020 1128   CL 104 08/28/2020 1128   CO2 23 08/28/2020 1128   GLUCOSE 94 08/28/2020 1128   BUN 12 08/28/2020 1128   CREATININE 0.73 08/28/2020 1128   CALCIUM 9.0 08/28/2020 1128   PROT 6.5 08/28/2020 1128   ALBUMIN 4.7 03/20/2017 1100   AST 15 08/28/2020 1128   ALT 12 08/28/2020 1128   ALKPHOS 65 03/20/2017 1100   BILITOT 0.4 08/28/2020 1128  Component Value Date/Time   WBC 6.4 08/28/2020 1128   RBC 4.09  08/28/2020 1128   HGB 12.9 08/28/2020 1128   HCT 38.8 08/28/2020 1128   PLT 314 08/28/2020 1128   MCV 94.9 08/28/2020 1128   MCH 31.5 08/28/2020 1128   MCHC 33.2 08/28/2020 1128   RDW 11.8 08/28/2020 1128   LYMPHSABS 2,281 08/25/2019 1114   MONOABS 570 03/20/2017 1100   EOSABS 435 08/25/2019 1114   BASOSABS 101 08/25/2019 1114    No results found for: POCLITH, LITHIUM   No results found for: PHENYTOIN, PHENOBARB, VALPROATE, CBMZ   .res Assessment: Plan:    Beth [name] was seen today for bipolar disorder, current episode depressed, severe, with p, follow-up, sleeping problem and depression.  Diagnoses and all orders for this visit:  Bipolar disorder, current episode depressed, severe, with psychotic features ([place]) -     ARIPiprazole (ABILIFY) 10 MG tablet; Take 1 tablet (10 mg total) by mouth daily.  RLS (restless legs syndrome) -     cyclobenzaprine (FLEXERIL) 10 MG tablet; Take 2 tablets (20 mg total) by mouth at bedtime.  PTSD (post-traumatic stress disorder) -     ARIPiprazole (ABILIFY) 10 MG tablet; Take 1 tablet (10 mg total) by mouth daily.  Generalized anxiety disorder -     ARIPiprazole (ABILIFY) 10 MG tablet; Take 1 tablet (10 mg total) by mouth daily.  Insomnia due to other mental disorder    Greater than 50% of face to face time with patient was spent on counseling and coordination of care. We discussed the following: Beth [name] has treatment resistant bipolar disorder with psychotic features versus schizoaffective disorder and other psychiatric diagnoses as well.  She has been very difficult to treat in part because she is very sensitive to antipsychotics and yet has chronic auditory hallucinations.    She is chronically anxious and depressed but the level of depression was not severe with depression being mild and anxiety being moderate.  Extensive history of psychiatric meds tried discussed with the patient.   Overall patient is easily overwhelmed with stress.  TR  insomnia chronically.  Option Dayvigo, Belsomra if changing the timing of Ambien does not work she can try the samples with the Ambien if necessary.  She has chronic insomnia which is difficult to manage and she will have rebound and insomnia if she attempts to stop the Ambien abruptly.  Discussed Ambien amnesia and side effects. No change in sleep meds this visit  Lately her auditory hallucinations have stopped.  But we can still consider if needed, Clozapine option.      Option retry CBZ and follow liver enzymes as they were only mildly elevated.Disc DDI complications of CBZ and would need to check liver enzymes DT past history of mild elevations.  There are a lot of medication interactions.  This is the only option that won't worsen RLS except clozapine.    Nausea and vomiting resolved by splitting the Depakote dosage but then she started taking it together and having nausea again. She still is concerned about potential weight gain from this med and we discussed this in detail.  Emotionally she is doing better than usual and I would prefer not to change meds given that the only reasonable alternative would be carbamazepine. Split Depakote to resolve nausea ER 500 mg BID.  RLS managed and neuro checked iron studies reportedly ok.   The increase to 6 mg HS doxazosin was successful at managing nightmares.  No  Caffeine after noon.  This  is helped.  04/18/20 appt with neuro about RLS.    Iron studies were normal.  The neurologist suggested referral to a movement disorder specialist.  We discussed at length that there are occasions in which going too high with a dopamine agonist can actually worsen restless legs rather than improve it.  She is at a high dose of ropinirole.  She does not want to go to see another doctor about this at this time.  Given that situation it was suggested that she try reducing the evening dose of ropinirole to 6 mg from 8 mg given that her restless legs is only a  problem in the evening before she goes to bed and not after.   Zolpidem 10 mg 1 nightly.  Couldn't live without it.  Disc taking on empty stomach and how to deal with it if food on stomach.  Disc withdrawal from Ambien.  Discussed side effects including amnesia.  continue Gabapentin for off label anxiety and RLS at previous dosage.  400 BID and 800 pm.  This has been helpful though has not resolve the problem.  The prognosis for improvement is very guarded from the use of medication.  She is very prone to EPS or restless legs from medications.    Discussed the risk of polypharmacy.  She is on multiple medications.  Retry Abilify 10 daily  Follow-up 6-8 weeks  Lynder Parents MD, DFAPA Please see After Visit Summary for patient specific instructions.  No future appointments.  No orders of the defined types were placed in this encounter.   -------------------------------

## 2021-02-18 ENCOUNTER — Telehealth: Payer: Self-pay | Admitting: Psychiatry

## 2021-02-18 NOTE — Telephone Encounter (Signed)
Please get her paper chart to me °

## 2021-02-18 NOTE — Telephone Encounter (Signed)
Pt will come tomorrow to get samples.I will get them ready

## 2021-02-18 NOTE — Telephone Encounter (Signed)
Patient Beth [name] stating she is currently on Abilify which gives her nightmares. She is requesting to switch to [place] . She states she can afford the medication if you do mail order through [facility]. Patient is requesting a call back concerning this request and instructions how to set up the mail order. # [id number]

## 2021-02-18 NOTE — Telephone Encounter (Signed)
Requesting to change from Abilify to [place].  Please review

## 2021-02-18 NOTE — Telephone Encounter (Signed)
Okay she can stop Abilify.  When she took 40 mg of Latuda before it gave her restless legs.  Let us try a lower dose.  See if she can come pick up samples of 20 mg and take one half daily for 1 week, then 1 daily with at least 350 cal.  We will see if she can tolerate it and get benefit and if so then I will send it into the pharmacy.

## 2021-02-20 DIAGNOSIS — R5383 Other fatigue: Secondary | ICD-10-CM | POA: Diagnosis not present

## 2021-02-20 DIAGNOSIS — R509 Fever, unspecified: Secondary | ICD-10-CM | POA: Diagnosis not present

## 2021-02-20 DIAGNOSIS — J069 Acute upper respiratory infection, unspecified: Secondary | ICD-10-CM | POA: Diagnosis not present

## 2021-02-20 DIAGNOSIS — R519 Headache, unspecified: Secondary | ICD-10-CM | POA: Diagnosis not present

## 2021-02-20 DIAGNOSIS — J029 Acute pharyngitis, unspecified: Secondary | ICD-10-CM | POA: Diagnosis not present

## 2021-02-21 ENCOUNTER — Other Ambulatory Visit: Payer: Self-pay

## 2021-02-21 DIAGNOSIS — F4310 Post-traumatic stress disorder, unspecified: Secondary | ICD-10-CM

## 2021-02-21 DIAGNOSIS — G2581 Restless legs syndrome: Secondary | ICD-10-CM

## 2021-02-21 DIAGNOSIS — F99 Mental disorder, not otherwise specified: Secondary | ICD-10-CM

## 2021-02-21 DIAGNOSIS — F315 Bipolar disorder, current episode depressed, severe, with psychotic features: Secondary | ICD-10-CM

## 2021-02-21 DIAGNOSIS — F4312 Post-traumatic stress disorder, chronic: Secondary | ICD-10-CM

## 2021-02-21 DIAGNOSIS — F5105 Insomnia due to other mental disorder: Secondary | ICD-10-CM

## 2021-02-21 MED ORDER — DOXAZOSIN MESYLATE 4 MG PO TABS: 6.0000 mg | ORAL_TABLET | Freq: Every evening | ORAL | 1 refills | Status: DC

## 2021-02-21 MED ORDER — ROPINIROLE HCL 4 MG PO TABS: 8.0000 mg | ORAL_TABLET | Freq: Every day | ORAL | 0 refills | Status: DC

## 2021-02-21 MED ORDER — CYCLOBENZAPRINE HCL 10 MG PO TABS: 20.0000 mg | ORAL_TABLET | Freq: Every day | ORAL | 0 refills | Status: DC

## 2021-02-21 MED ORDER — GABAPENTIN 400 MG PO CAPS
ORAL_CAPSULE | ORAL | 0 refills | Status: DC
Start: 2021-02-21 — End: 2021-02-23

## 2021-02-21 MED ORDER — ROPINIROLE HCL 2 MG PO TABS: ORAL_TABLET | ORAL | 0 refills | Status: DC

## 2021-02-21 MED ORDER — LAMOTRIGINE 200 MG PO TABS: 300.0000 mg | ORAL_TABLET | Freq: Every day | ORAL | 1 refills | Status: DC

## 2021-02-22 ENCOUNTER — Other Ambulatory Visit: Payer: Self-pay | Admitting: Psychiatry

## 2021-02-22 DIAGNOSIS — G2581 Restless legs syndrome: Secondary | ICD-10-CM

## 2021-02-26 ENCOUNTER — Other Ambulatory Visit: Payer: Self-pay

## 2021-02-26 ENCOUNTER — Telehealth: Payer: Self-pay | Admitting: Psychiatry

## 2021-02-26 DIAGNOSIS — F411 Generalized anxiety disorder: Secondary | ICD-10-CM

## 2021-02-26 DIAGNOSIS — F4310 Post-traumatic stress disorder, unspecified: Secondary | ICD-10-CM

## 2021-02-26 DIAGNOSIS — F5105 Insomnia due to other mental disorder: Secondary | ICD-10-CM

## 2021-02-26 NOTE — Telephone Encounter (Signed)
Rx's pended for Dr. Clovis Pu

## 2021-02-26 NOTE — Telephone Encounter (Signed)
[facility] called and left a message about refills for lorazapam 1 mg and zolpidem 10 mg to be sent to them

## 2021-02-28 ENCOUNTER — Other Ambulatory Visit: Payer: Self-pay

## 2021-03-06 ENCOUNTER — Other Ambulatory Visit: Payer: Self-pay | Admitting: Psychiatry

## 2021-03-06 ENCOUNTER — Telehealth: Payer: Self-pay | Admitting: Psychiatry

## 2021-03-06 ENCOUNTER — Other Ambulatory Visit: Payer: Self-pay

## 2021-03-06 DIAGNOSIS — F4310 Post-traumatic stress disorder, unspecified: Secondary | ICD-10-CM

## 2021-03-06 DIAGNOSIS — F411 Generalized anxiety disorder: Secondary | ICD-10-CM

## 2021-03-06 DIAGNOSIS — F99 Mental disorder, not otherwise specified: Secondary | ICD-10-CM

## 2021-03-06 DIAGNOSIS — F5105 Insomnia due to other mental disorder: Secondary | ICD-10-CM

## 2021-03-06 DIAGNOSIS — F315 Bipolar disorder, current episode depressed, severe, with psychotic features: Secondary | ICD-10-CM

## 2021-03-06 MED ORDER — ZOLPIDEM TARTRATE 10 MG PO TABS
10.0000 mg | ORAL_TABLET | Freq: Every evening | ORAL | 1 refills | Status: DC | PRN
Start: 2021-03-06 — End: 2021-05-07

## 2021-03-06 MED ORDER — SERTRALINE HCL 100 MG PO TABS: 100.0000 mg | ORAL_TABLET | Freq: Every day | ORAL | 0 refills | Status: DC

## 2021-03-06 NOTE — Telephone Encounter (Signed)
Pt called requesting Ambien & Ativan Rx for a month to [facility] at [place]. Also, need 90 Rx for Ambien and Ativan to [facility] mail order pharmacy. Pt will be out soon. Apt 4/4

## 2021-03-06 NOTE — Telephone Encounter (Signed)
Please review

## 2021-03-06 NOTE — Progress Notes (Signed)
This was also noted in a telephone note.  Patient is seeking early refill of lorazepam at a local pharmacy and 90-day prescription.  Lorazepam refill request from a local pharmacy is clearly inappropriate and early.  It will not be granted.  Will not allow 90-day refill of either lorazepam or zolpidem because of these early refill request.

## 2021-03-06 NOTE — Telephone Encounter (Signed)
PDMP shows the following: Filled             Written    02/23/2021  10/02/2020  120.00  Lorazepam 1 Mg Tablet      02/08/2021  10/02/2020  30.00   Zolpidem Tartrate 10 Mg Tablet  She is requesting lorazepam much too early.  She's on a high dosage.  I will not allow a 90 day RX of these controlled substances and she cannot have an early refill.  The zolpidem is due and I'll send in that RX.  She should not seek early refills.

## 2021-03-18 ENCOUNTER — Other Ambulatory Visit: Payer: Self-pay

## 2021-03-18 ENCOUNTER — Ambulatory Visit (INDEPENDENT_AMBULATORY_CARE_PROVIDER_SITE_OTHER): Payer: Medicare HMO | Admitting: Psychiatry

## 2021-03-18 ENCOUNTER — Encounter: Payer: Self-pay | Admitting: Psychiatry

## 2021-03-18 DIAGNOSIS — F5105 Insomnia due to other mental disorder: Secondary | ICD-10-CM

## 2021-03-18 DIAGNOSIS — F99 Mental disorder, not otherwise specified: Secondary | ICD-10-CM | POA: Diagnosis not present

## 2021-03-18 DIAGNOSIS — F515 Nightmare disorder: Secondary | ICD-10-CM | POA: Diagnosis not present

## 2021-03-18 DIAGNOSIS — G2581 Restless legs syndrome: Secondary | ICD-10-CM

## 2021-03-18 DIAGNOSIS — F411 Generalized anxiety disorder: Secondary | ICD-10-CM | POA: Diagnosis not present

## 2021-03-18 DIAGNOSIS — F4312 Post-traumatic stress disorder, chronic: Secondary | ICD-10-CM | POA: Diagnosis not present

## 2021-03-18 DIAGNOSIS — F4310 Post-traumatic stress disorder, unspecified: Secondary | ICD-10-CM

## 2021-03-18 DIAGNOSIS — F315 Bipolar disorder, current episode depressed, severe, with psychotic features: Secondary | ICD-10-CM | POA: Diagnosis not present

## 2021-03-18 NOTE — Patient Instructions (Signed)
Option [place]  Increase Latuda to 30 mg daily

## 2021-03-18 NOTE — Progress Notes (Signed)
Yesika [name] [id number] Sep 22, 1976 45 y.o.  Subjective:   Patient ID:  Beth [name] is a 45 y.o. (DOB 06-01-1976) female.  Chief Complaint:  Chief Complaint  Patient presents with  . Follow-up  . Bipolar disorder, current episode depressed, severe, with p  . Anxiety  . Sleeping Problem  . Medication Problem    HPI Beth [name] presents to the office today for follow-up of the below.  Beth [name] presents to the office today for follow-up of TRD bipolar unstable which is difficult to treat due to med sensitivity and sleep problems.    seen August 25 , 2020.  Encouraged her to split the dose of Depakote ER 500 mg twice daily and get it away from bedtime to see if nausea and vomiting will improve.  She was encouraged to stop caffeine after noon because of insomnia and we switched from Ambien CR to regular Ambien 10 mg. Doing OK overall.    seen November 08, 2019.  She was still having some issues with sleep but was drinking caffeine too late and she was encouraged to stop caffeine after noon.  No other meds were changed.  seen January 18, 2020.  The following was noted: Not good.  Uncle living with them died 14-Mar-2023.  Had heart problems and apparent MI. Before that was relatively OK.  With new insurance can get generic Saphris for $24 .  It helped her sleep so well.  Doesn't remember how it worked for mood.  But wants to restart it for the sleep benefit and potential mood benefit.  Prior to his death mood swings were OK and anxiety manageable but it won't be that way now.  Has a lot more anxiety and depression.  Crying all the time.  Stressed over it.  She was to start Saphris 5 mg nightly and go up to 10 mg nightly if needed.[name]  April 23rd 2021 appointment, the following is noted: Took one Saphris and had bad RLS the rest of the night.  Asked to increase doxazosin bc NM of uncles death. On Sinemet  mos per neuro. Meds for RLS changed by doctor and it's better.No dizziness  with doxazosin.  Reducing caffeine helped RLS. NM mostly controlled... Taking ambien 10 and doxazosin. Doxazosin helped NM. Had NM of F every night and would interfere. Tired of dreaming of him. They stopped.. Saphris helped her go to sleep. Before it couldn't get to sleep. Now average 5 hours sleep. No naps. No RLS right now with ropinirole better than pramipexole. Insurance change demand she stop Saphris, Latuda, loxapine is $100/month.  She looked up what she can afford risperidone, Geodon, haloperidol, Tegretol  She can't afford Vraylar. Current stressors $, Plan: pt wanted to increase doxazosin to 6 mg HS for residual NM.  06/06/20 appt with the following noted: Tolerating meds.   NM controlled.  RLS is still in evening and not much at night. Had good realtionship with father who died 3 years ago.  Had good relationship with uncle who died late 11-Jan-2020. Neurontin and Requip help most of all the meds.   Asked about weight gain with Depakote. Sleep is better with better schedule with husband's job. Overall mood is pretty stable except anniversary events.  10/02/20 appt with the following noted: Don't like Depakote bc more nausea with it.  Starts at night after it.  Takes over an hour to get to sleep.  N gone in the morning.   No RLS in bed but sometimes in the  evening.  Mood and hallucinations under control.  Anxiety sometimes and occ panic. Sleep chronic issues.    Helps with sister's kids 3 times weekly. Plan: no med changes  01/29/2021 appointment with following noted: Not going well.  Neuro Dr. [name] [facility]  dropped her as patient and wouldn't refill muscle relaxer, cyclobenzaprine for RLS.  Stress at home and life broken me down.  Wants to get on an actual mood stabilizer.  Very depressed and cycles into irritability.  Has tried to get into the office sooner. Not hearing voices but SI a lot without intent or plan. Hasn't felt this bad in years. No NM.  Nausea even off  Depakote. Plan: Retry Abilify 10 daily  02/18/2021 TC:  CO SE NM and stopped Abilify. Asked to try Latuda and OK trial lower dose 20 mg daily.  03/18/2021 appt noted: CO trouble sleeping after starting Latuda 20 PM. More depressed than she was.  Sometimes irritable but more depression.  Sometimes SI.   Primary stress $, life.  Remote SI but did not hurt herself, was hospitalized.  Prior psychiatric medication trials include Latuda 40 restless legs,  Fanapt which cause restless legs,  Loxapine weight,  Abilfiy 10 NM  Saphris which caused weight gain & RLS, Liked saphris some. olanzapine weight gain,  Geodon,  Seroquel weight gain and restless legs,  risperidone restless legs,  Haloperidol 2 RLS,    loxapine cost and weight,  Latuda helped mood but can't afford. Sinemet, gabapentin. Mirapex, ropinirole Lyrica weight gain, lithium side effects, lamotrigine, Equetro with mildly elevated liver enzymes, Depakote nausea Lexapro, duloxetine, venlafaxine, bupropion, fluoxetine, sertraline,    doxepin ineffective, Restoril lost effect,  Ambien, mirtazapine increased appetite, trazodone restlessness,,  04/18/20 appt with neuro about RLS.    Iron studies were normal.  The neurologist suggested referral to a movement disorder specialist.  We discussed at length that there are occasions in which going too high with a dopamine agonist can actually worsen restless legs rather than improve it.  She is at a high dose of ropinirole.  She does not want to go to see another doctor about this at this time.  Given that situation it was suggested that she try reducing the evening dose of ropinirole to 6 mg from 8 mg given that her restless legs is only a problem in the evening before she goes to bed and not after.   Review of Systems:  Review of Systems  Cardiovascular: Negative for chest pain and palpitations.  Gastrointestinal: Negative for nausea.  Neurological: Negative for dizziness, tremors and  weakness.       RLS  Psychiatric/Behavioral: Positive for dysphoric mood.    Medications: I have reviewed the patient's current medications.  Current Outpatient Medications  Medication Sig Dispense Refill  . cyclobenzaprine (FLEXERIL) 10 MG tablet TAKE 2 TABLETS AT BEDTIME 180 tablet 0  . doxazosin (CARDURA) 4 MG tablet Take 1.5 tablets (6 mg total) by mouth at bedtime. 135 tablet 1  . gabapentin (NEURONTIN) 400 MG capsule TAKE 1 CAPSULE TWICE DAILY  AND TAKE 2 CAPSULES IN THE EVENING 360 capsule 0  . lamoTRIgine (LAMICTAL) 200 MG tablet Take 1.5 tablets (300 mg total) by mouth daily. 135 tablet 1  . LORazepam (ATIVAN) 1 MG tablet Take 1 tablet (1 mg total) by mouth 4 (four) times daily. 120 tablet 3  . lurasidone (LATUDA) 20 MG TABS tablet Take 20 mg by mouth daily.    [name] rOPINIRole (REQUIP) 2 MG tablet TAKE 1 TABLET  AT 5 PM. TAKE IN ADDITION TO 8MG  AT BEDTIME. 90 tablet 0  . rOPINIRole (REQUIP) 4 MG tablet TAKE 2 TABLETS (8 MG TOTAL) BY MOUTH AT BEDTIME. 180 tablet 0  . sertraline (ZOLOFT) 100 MG tablet Take 1 tablet (100 mg total) by mouth daily. 90 tablet 0  . valACYclovir (VALTREX) 500 MG tablet Take one tablet by mouth twice daily for 3-5 days then daily as needed. 90 tablet 4  . zolpidem (AMBIEN) 10 MG tablet Take 1 tablet (10 mg total) by mouth at bedtime as needed. for sleep 30 tablet 1   No current facility-administered medications for this visit.    Medication Side Effects: nausea  Allergies: No Known Allergies  Past Medical History:  Diagnosis Date  . Bipolar disorder ([place])   . HSV (herpes simplex virus) anogenital infection 05/2018  . RLS (restless legs syndrome)     Family History  Problem Relation Age of Onset  . Thyroid disease Mother   . COPD Mother   . Bipolar disorder Mother   . Parkinson's disease Father   . Breast cancer Paternal Grandmother 14  . Bipolar disorder Sister   . Schizophrenia Other     Social History   Socioeconomic History  . Marital  status: Married    Spouse name: Not on file  . Number of children: 0  . Years of education: 54  . Highest education level: Associate degree: academic program  Occupational History  . Occupation: unemployed  Tobacco Use  . Smoking status: Former Research scientist (life sciences)  . Smokeless tobacco: Never Used  Vaping Use  . Vaping Use: Never used  Substance and Sexual Activity  . Alcohol use: No  . Drug use: No  . Sexual activity: Yes    Birth control/protection: None    Comment: 1st intercourse 27 yo-5 partners  Other Topics Concern  . 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    Social Determinants of Health   Financial Resource Strain: Not on file  Food Insecurity: Not on file  Transportation Needs: Not on file  Physical Activity: Not on file  Stress: Not on file  Social Connections: Not on file  Intimate Partner Violence: Not on file    Past Medical History, Surgical history, Social history, and Family history were reviewed and updated as appropriate.   Please see review of systems for further details on the patient's review from today.   Objective:   Physical Exam:  There were no vitals taken for this visit.  Physical Exam Constitutional:      General: She is not in acute distress. Musculoskeletal:        General: No deformity.  Neurological:     Mental Status: She is alert and oriented to person, place, and time.     Coordination: Coordination normal.  Psychiatric:        Attention and Perception: Attention and perception normal. She does not perceive auditory or visual hallucinations.        Mood and Affect: Mood is anxious and depressed. Affect is not labile, blunt, angry or inappropriate.        Speech: Speech normal.        Behavior: Behavior normal.        Thought Content: Thought content normal. Thought content is not  paranoid or  delusional. Thought content does not include homicidal or suicidal ideation. Thought content does not include homicidal or suicidal plan.        Cognition and Memory: Cognition and memory normal.        Judgment: Judgment normal.     Comments: Insight intact Patient has varying levels of depression and anxiety and worse lately.     Lab Review:     Component Value Date/Time   NA 135 08/28/2020 1128   K 4.8 08/28/2020 1128   CL 104 08/28/2020 1128   CO2 23 08/28/2020 1128   GLUCOSE 94 08/28/2020 1128   BUN 12 08/28/2020 1128   CREATININE 0.73 08/28/2020 1128   CALCIUM 9.0 08/28/2020 1128   PROT 6.5 08/28/2020 1128   ALBUMIN 4.7 03/20/2017 1100   AST 15 08/28/2020 1128   ALT 12 08/28/2020 1128   ALKPHOS 65 03/20/2017 1100   BILITOT 0.4 08/28/2020 1128       Component Value Date/Time   WBC 6.4 08/28/2020 1128   RBC 4.09 08/28/2020 1128   HGB 12.9 08/28/2020 1128   HCT 38.8 08/28/2020 1128   PLT 314 08/28/2020 1128   MCV 94.9 08/28/2020 1128   MCH 31.5 08/28/2020 1128   MCHC 33.2 08/28/2020 1128   RDW 11.8 08/28/2020 1128   LYMPHSABS 2,281 08/25/2019 1114   MONOABS 570 03/20/2017 1100   EOSABS 435 08/25/2019 1114   BASOSABS 101 08/25/2019 1114    No results found for: POCLITH, LITHIUM   No results found for: PHENYTOIN, PHENOBARB, VALPROATE, CBMZ   .res Assessment: Plan:    Meili was seen today for follow-up, bipolar disorder, current episode depressed, severe, with p, anxiety, sleeping problem and medication problem.  Diagnoses and all orders for this visit:  Bipolar disorder, current episode depressed, severe, with psychotic features ([place])  PTSD (post-traumatic stress disorder)  Generalized anxiety disorder  Nightmares associated with chronic post-traumatic stress disorder  Insomnia due to other mental disorder  RLS (restless legs syndrome)    Greater than 50% of face to face time with patient was spent on counseling and coordination of care.  We discussed the following: Devanshi has treatment resistant bipolar disorder with psychotic features versus schizoaffective disorder and other psychiatric diagnoses as well.  She has been very difficult to treat in part because she is very sensitive to antipsychotics and yet has chronic auditory hallucinations.    She is chronically anxious and depressed but the level of depression was not severe with depression being mild and anxiety being moderate.  Extensive history of psychiatric meds tried discussed with the patient.   Overall patient is easily overwhelmed with stress.  TR insomnia chronically.  Option Dayvigo, Belsomra if changing the timing of Ambien does not work she can try the samples with the Ambien if necessary.  She has chronic insomnia which is difficult to manage and she will have rebound and insomnia if she attempts to stop the Ambien abruptly.  Discussed Ambien amnesia and side effects. No change in sleep meds this visit RLS managed right now. RLS managed and neuro checked iron studies reportedly ok.  No 90 day of Ativan or Ambien, though she asked for it.  Lately her auditory hallucinations have stopped.  But we can still consider if needed, Clozapine option.      Option retry CBZ and follow liver enzymes as they were only mildly elevated.Disc DDI complications of CBZ and would need to check liver enzymes DT past history of mild elevations.  There are a  lot of medication interactions.  This is the only option that won't worsen RLS except clozapine.   Consider Caplyta   The increase to 6 mg HS doxazosin was successful at managing nightmares. NM stopped after recurring on Abilify.  No  Caffeine after noon.  This is helped.  Zolpidem 10 mg 1 nightly.  Couldn't live without it.  Disc taking on empty stomach and how to deal with it if food on stomach.  Disc withdrawal from Ambien.  Discussed side effects including amnesia.  continue Gabapentin for off label anxiety and RLS  at previous dosage.  400 BID and 800 pm.  This has been helpful though has not resolve the problem.  The prognosis for improvement is very guarded from the use of medication.  She is very prone to EPS or restless legs from medications.    Discussed the risk of polypharmacy.  She is on multiple medications.   Increase Latuda to 30 mg and take in AM with food. Avoid PM to prevent RLS.  For depression  Follow-up 6-8 weeks  Lynder Parents MD, DFAPA Please see After Visit Summary for patient specific instructions.  No future appointments.  No orders of the defined types were placed in this encounter.   -------------------------------

## 2021-03-20 ENCOUNTER — Other Ambulatory Visit: Payer: Self-pay | Admitting: Psychiatry

## 2021-03-20 DIAGNOSIS — F4310 Post-traumatic stress disorder, unspecified: Secondary | ICD-10-CM

## 2021-03-20 DIAGNOSIS — F411 Generalized anxiety disorder: Secondary | ICD-10-CM

## 2021-03-20 NOTE — Telephone Encounter (Signed)
Controlled substance 

## 2021-04-09 ENCOUNTER — Telehealth: Payer: Self-pay | Admitting: Psychiatry

## 2021-04-09 NOTE — Telephone Encounter (Signed)
Pt is taking Latuda 30mg . She feels it is not working, not sleeping well at night at all. Please advise.

## 2021-04-09 NOTE — Telephone Encounter (Signed)
Pt has been taking 30 mg samples and she wanted to update you.She is eating more and having trouble sleeping

## 2021-04-09 NOTE — Telephone Encounter (Signed)
If Beth [name] is not helping her depression then stop it.

## 2021-04-10 NOTE — Telephone Encounter (Signed)
Pt informed.She will stop

## 2021-04-11 ENCOUNTER — Other Ambulatory Visit: Payer: Self-pay | Admitting: Psychiatry

## 2021-04-11 DIAGNOSIS — F411 Generalized anxiety disorder: Secondary | ICD-10-CM

## 2021-04-11 DIAGNOSIS — F315 Bipolar disorder, current episode depressed, severe, with psychotic features: Secondary | ICD-10-CM

## 2021-04-11 DIAGNOSIS — F4310 Post-traumatic stress disorder, unspecified: Secondary | ICD-10-CM

## 2021-04-18 ENCOUNTER — Other Ambulatory Visit: Payer: Self-pay | Admitting: Psychiatry

## 2021-04-18 DIAGNOSIS — F411 Generalized anxiety disorder: Secondary | ICD-10-CM

## 2021-04-18 DIAGNOSIS — F4310 Post-traumatic stress disorder, unspecified: Secondary | ICD-10-CM

## 2021-04-22 ENCOUNTER — Other Ambulatory Visit: Payer: Self-pay | Admitting: Psychiatry

## 2021-04-22 DIAGNOSIS — F4310 Post-traumatic stress disorder, unspecified: Secondary | ICD-10-CM

## 2021-04-22 DIAGNOSIS — F411 Generalized anxiety disorder: Secondary | ICD-10-CM

## 2021-04-25 NOTE — Telephone Encounter (Signed)
Last filled 03/25/21

## 2021-04-25 NOTE — Telephone Encounter (Signed)
Apt 6/02

## 2021-04-25 NOTE — Telephone Encounter (Signed)
Pt left message requesting RF for Ativan. Out today.

## 2021-05-06 ENCOUNTER — Other Ambulatory Visit: Payer: Self-pay | Admitting: Psychiatry

## 2021-05-06 DIAGNOSIS — F315 Bipolar disorder, current episode depressed, severe, with psychotic features: Secondary | ICD-10-CM

## 2021-05-07 ENCOUNTER — Other Ambulatory Visit: Payer: Self-pay | Admitting: Psychiatry

## 2021-05-07 DIAGNOSIS — F5105 Insomnia due to other mental disorder: Secondary | ICD-10-CM

## 2021-05-07 DIAGNOSIS — F99 Mental disorder, not otherwise specified: Secondary | ICD-10-CM

## 2021-05-16 ENCOUNTER — Ambulatory Visit (INDEPENDENT_AMBULATORY_CARE_PROVIDER_SITE_OTHER): Payer: Medicare HMO | Admitting: Psychiatry

## 2021-05-16 ENCOUNTER — Other Ambulatory Visit: Payer: Self-pay

## 2021-05-16 ENCOUNTER — Encounter: Payer: Self-pay | Admitting: Psychiatry

## 2021-05-16 DIAGNOSIS — F5105 Insomnia due to other mental disorder: Secondary | ICD-10-CM

## 2021-05-16 DIAGNOSIS — F4310 Post-traumatic stress disorder, unspecified: Secondary | ICD-10-CM | POA: Diagnosis not present

## 2021-05-16 DIAGNOSIS — F411 Generalized anxiety disorder: Secondary | ICD-10-CM

## 2021-05-16 DIAGNOSIS — F99 Mental disorder, not otherwise specified: Secondary | ICD-10-CM

## 2021-05-16 DIAGNOSIS — F315 Bipolar disorder, current episode depressed, severe, with psychotic features: Secondary | ICD-10-CM | POA: Diagnosis not present

## 2021-05-16 MED ORDER — LATUDA 60 MG PO TABS
60.0000 mg | ORAL_TABLET | Freq: Every day | ORAL | 0 refills | Status: DC
Start: 2021-05-16 — End: 2021-11-25

## 2021-05-16 MED ORDER — ZOLPIDEM TARTRATE 10 MG PO TABS: 10.0000 mg | ORAL_TABLET | Freq: Every evening | ORAL | 3 refills | Status: DC | PRN

## 2021-05-16 MED ORDER — LORAZEPAM 1 MG PO TABS: 1.0000 mg | ORAL_TABLET | Freq: Four times a day (QID) | ORAL | 3 refills | Status: DC

## 2021-05-16 NOTE — Progress Notes (Signed)
Beth [name] [id number] [birth year] 45 y.o.  Subjective:   Patient ID:  Beth [name] is a 45 y.o. (DOB 01-19-1976) female.  Chief Complaint:  Chief Complaint  Patient presents with  . Follow-up  . Depression  . Anxiety  . Sleeping Problem    HPI Beth [name] presents to the office today for follow-up of the below.  Beth [name] presents to the office today for follow-up of TRD bipolar unstable which is difficult to treat due to med sensitivity and sleep problems.    seen August 25 , 2020.  Encouraged her to split the dose of Depakote ER 500 mg twice daily and get it away from bedtime to see if nausea and vomiting will improve.  She was encouraged to stop caffeine after noon because of insomnia and we switched from Ambien CR to regular Ambien 10 mg. Doing OK overall.    seen November 08, 2019.  She was still having some issues with sleep but was drinking caffeine too late and she was encouraged to stop caffeine after noon.  No other meds were changed.  seen January 18, 2020.  The following was noted: Not good.  Uncle living with them died 2023/04/04.  Had heart problems and apparent MI. Before that was relatively OK.  With new insurance can get generic Saphris for $24 .  It helped her sleep so well.  Doesn't remember how it worked for mood.  But wants to restart it for the sleep benefit and potential mood benefit.  Prior to his death mood swings were OK and anxiety manageable but it won't be that way now.  Has a lot more anxiety and depression.  Crying all the time.  Stressed over it.  She was to start Saphris 5 mg nightly and go up to 10 mg nightly if needed.[name]  April 23rd 2021 appointment, the following is noted: Took one Saphris and had bad RLS the rest of the night.  Asked to increase doxazosin bc NM of uncles death. On Sinemet  mos per neuro. Meds for RLS changed by doctor and it's better.No dizziness with doxazosin.  Reducing caffeine helped RLS. NM mostly controlled...  Taking ambien 10 and doxazosin. Doxazosin helped NM. Had NM of F every night and would interfere. Tired of dreaming of him. They stopped.. Saphris helped her go to sleep. Before it couldn't get to sleep. Now average 5 hours sleep. No naps. No RLS right now with ropinirole better than pramipexole. Insurance change demand she stop Saphris, Latuda, loxapine is $100/month.  She looked up what she can afford risperidone, Geodon, haloperidol, Tegretol  She can't afford Vraylar. Current stressors $, Plan: pt wanted to increase doxazosin to 6 mg HS for residual NM.  06/06/20 appt with the following noted: Tolerating meds.   NM controlled.  RLS is still in evening and not much at night. Had good realtionship with father who died 3 years ago.  Had good relationship with uncle who died late 01/31/20. Neurontin and Requip help most of all the meds.   Asked about weight gain with Depakote. Sleep is better with better schedule with husband's job. Overall mood is pretty stable except anniversary events.  10/02/20 appt with the following noted: Don't like Depakote bc more nausea with it.  Starts at night after it.  Takes over an hour to get to sleep.  N gone in the morning.   No RLS in bed but sometimes in the evening.  Mood and hallucinations under control.  Anxiety sometimes and  occ panic. Sleep chronic issues.    Helps with sister's kids 3 times weekly. Plan: no med changes  01/29/2021 appointment with following noted: Not going well.  Neuro Dr. [name] [facility]  dropped her as patient and wouldn't refill muscle relaxer, cyclobenzaprine for RLS.  Stress at home and life broken me down.  Wants to get on an actual mood stabilizer.  Very depressed and cycles into irritability.  Has tried to get into the office sooner. Not hearing voices but SI a lot without intent or plan. Hasn't felt this bad in years. No NM.  Nausea even off Depakote. Plan: Retry Abilify 10 daily  02/18/2021 TC:  CO SE NM and  stopped Abilify. Asked to try Latuda and OK trial lower dose 20 mg daily.  03/18/2021 appt noted: CO trouble sleeping after starting Latuda 20 PM. More depressed than she was.  Sometimes irritable but more depression.  Sometimes SI.   Primary stress $, life. Plan:  Increase Latuda to 30 mg and take in AM with food. Avoid PM to prevent RLS.  For depression  04/09/2021 phone call: Patient called stating she wanted to stop the Latuda 30 mg because she felt like it was making her hungry and eat more and trouble sleeping.  Plus she did not feel any depression benefit from that dosage of 30 mg.  05/16/2021 appointment with the following noted: Feeling better  Without SI.  Problems with EFA.  To bed 930 and then up at 4 AM.  No naps.   Caplyta is $500/90 days and Latuda $300/90 days Would like to stay asleep all night.  No initial insomnia. Ropinirole is managing the RLS.  Pleased with the dose. No SE other meds. Taking Ativan 4 mg daily. Still on Depakote.ER 1000 mg but was not on med list. Agrees restart Latuda.  Remote SI but did not hurt herself, was hospitalized.  Prior psychiatric medication trials include Latuda 40 restless legs,  Fanapt which cause restless legs,  Loxapine weight,  Abilfiy 10 NM  Saphris which caused weight gain & RLS, Liked saphris some. olanzapine weight gain,  Geodon,  Seroquel weight gain and restless legs,  risperidone restless legs,  Haloperidol 2 RLS,    loxapine cost and weight,  Latuda helped mood but $ first trial, retrial SE hunger Sinemet, gabapentin. Mirapex, ropinirole Lyrica weight gain, lithium side effects, lamotrigine, Equetro with mildly elevated liver enzymes, Depakote nausea Lexapro, duloxetine, venlafaxine, bupropion, fluoxetine, sertraline,    doxepin ineffective, Restoril lost effect,  Ambien, mirtazapine increased appetite, trazodone restlessness,,  04/18/20 appt with neuro about RLS.    Iron studies were normal.  The neurologist suggested  referral to a movement disorder specialist.  We discussed at length that there are occasions in which going too high with a dopamine agonist can actually worsen restless legs rather than improve it.  She is at a high dose of ropinirole.  She does not want to go to see another doctor about this at this time.  Given that situation it was suggested that she try reducing the evening dose of ropinirole to 6 mg from 8 mg given that her restless legs is only a problem in the evening before she goes to bed and not after.   Review of Systems:  Review of Systems  Cardiovascular: Negative for chest pain and palpitations.  Gastrointestinal: Negative for nausea.  Neurological: Negative for dizziness, tremors and weakness.       RLS  Psychiatric/Behavioral: Negative for dysphoric mood.  Medications: I have reviewed the patient's current medications.  Current Outpatient Medications  Medication Sig Dispense Refill  . cyclobenzaprine (FLEXERIL) 10 MG tablet TAKE 2 TABLETS AT BEDTIME 180 tablet 0  . divalproex (DEPAKOTE ER) 500 MG 24 hr tablet Take 1,000 mg by mouth daily.    [name] doxazosin (CARDURA) 4 MG tablet Take 1.5 tablets (6 mg total) by mouth at bedtime. 135 tablet 1  . gabapentin (NEURONTIN) 400 MG capsule TAKE 1 CAPSULE TWICE DAILY  AND TAKE 2 CAPSULES IN THE EVENING 360 capsule 0  . lamoTRIgine (LAMICTAL) 200 MG tablet TAKE ONE AND ONE-HALF (1.5) TABLETS BY MOUTH DAILY 135 tablet 1  . Lurasidone HCl (LATUDA) 60 MG TABS Take 1 tablet (60 mg total) by mouth daily. 90 tablet 0  . rOPINIRole (REQUIP) 2 MG tablet TAKE 1 TABLET AT 5 PM. TAKE IN ADDITION TO 8MG  AT BEDTIME. 90 tablet 0  . rOPINIRole (REQUIP) 4 MG tablet TAKE 2 TABLETS (8 MG TOTAL) BY MOUTH AT BEDTIME. 180 tablet 0  . sertraline (ZOLOFT) 100 MG tablet TAKE ONE TABLET BY MOUTH DAILY 90 tablet 0  . valACYclovir (VALTREX) 500 MG tablet Take one tablet by mouth twice daily for 3-5 days then daily as needed. 90 tablet 4  . LORazepam (ATIVAN) 1 MG  tablet Take 1 tablet (1 mg total) by mouth 4 (four) times daily. 120 tablet 3  . zolpidem (AMBIEN) 10 MG tablet Take 1 tablet (10 mg total) by mouth at bedtime as needed for sleep. 30 tablet 3   No current facility-administered medications for this visit.    Medication Side Effects: nausea  Allergies: No Known Allergies  Past Medical History:  Diagnosis Date  . Bipolar disorder ([place])   . HSV (herpes simplex virus) anogenital infection 05/2018  . RLS (restless legs syndrome)     Family History  Problem Relation Age of Onset  . Thyroid disease Mother   . COPD Mother   . Bipolar disorder Mother   . Parkinson's disease Father   . Breast cancer Paternal Grandmother 74  . Bipolar disorder Sister   . Schizophrenia Other     Social History   Socioeconomic History  . Marital status: Married    Spouse name: Not on file  . Number of children: 0  . Years of education: 40  . Highest education level: Associate degree: academic program  Occupational History  . Occupation: unemployed  Tobacco Use  . Smoking status: Former Research scientist (life sciences)  . Smokeless tobacco: Never Used  Vaping Use  . Vaping Use: Never used  Substance and Sexual Activity  . Alcohol use: No  . Drug use: No  . Sexual activity: Yes    Birth control/protection: None    Comment: 1st intercourse 71 yo-5 partners  Other Topics Concern  . 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    Social Determinants of Health   Financial Resource Strain: Not on file  Food Insecurity: Not on file  Transportation Needs: Not on file  Physical Activity: Not on file  Stress: Not on file  Social Connections: Not on file  Intimate Partner Violence: Not on file    Past Medical History, Surgical history, Social history, and Family history were reviewed and updated as appropriate.    Please see review of systems for  further details on the patient's review from today.   Objective:   Physical Exam:  There were no vitals taken for this visit.  Physical Exam Constitutional:      General: She is not in acute distress. Musculoskeletal:        General: No deformity.  Neurological:     Mental Status: She is alert and oriented to person, place, and time.     Coordination: Coordination normal.  Psychiatric:        Attention and Perception: Attention and perception normal. She does not perceive auditory or visual hallucinations.        Mood and Affect: Mood is anxious. Mood is not depressed. Affect is not labile, blunt, angry or inappropriate.        Speech: Speech normal.        Behavior: Behavior normal.        Thought Content: Thought content normal. Thought content is not paranoid or delusional. Thought content does not include homicidal or suicidal ideation. Thought content does not include homicidal or suicidal plan.        Cognition and Memory: Cognition and memory normal.        Judgment: Judgment normal.     Comments: Insight intact Patient has varying levels of depression and anxiety and better lately.     Lab Review:     Component Value Date/Time   NA 135 08/28/2020 1128   K 4.8 08/28/2020 1128   CL 104 08/28/2020 1128   CO2 23 08/28/2020 1128   GLUCOSE 94 08/28/2020 1128   BUN 12 08/28/2020 1128   CREATININE 0.73 08/28/2020 1128   CALCIUM 9.0 08/28/2020 1128   PROT 6.5 08/28/2020 1128   ALBUMIN 4.7 03/20/2017 1100   AST 15 08/28/2020 1128   ALT 12 08/28/2020 1128   ALKPHOS 65 03/20/2017 1100   BILITOT 0.4 08/28/2020 1128       Component Value Date/Time   WBC 6.4 08/28/2020 1128   RBC 4.09 08/28/2020 1128   HGB 12.9 08/28/2020 1128   HCT 38.8 08/28/2020 1128   PLT 314 08/28/2020 1128   MCV 94.9 08/28/2020 1128   MCH 31.5 08/28/2020 1128   MCHC 33.2 08/28/2020 1128   RDW 11.8 08/28/2020 1128   LYMPHSABS 2,281 08/25/2019 1114   MONOABS  570 03/20/2017 1100   EOSABS 435 08/25/2019 1114   BASOSABS 101 08/25/2019 1114    No results found for: POCLITH, LITHIUM   No results found for: PHENYTOIN, PHENOBARB, VALPROATE, CBMZ   .res Assessment: Plan:    Beth [name] was seen today for follow-up, depression, anxiety and sleeping problem.  Diagnoses and all orders for this visit:  Bipolar disorder, current episode depressed, severe, with psychotic features ([place]) -     Lurasidone HCl (LATUDA) 60 MG TABS; Take 1 tablet (60 mg total) by mouth daily.  Insomnia due to other mental disorder -     zolpidem (AMBIEN) 10 MG tablet; Take 1 tablet (10 mg total) by mouth at bedtime as needed for sleep.  PTSD (post-traumatic stress disorder) -     LORazepam (ATIVAN) 1 MG tablet; Take 1 tablet (1 mg total) by mouth 4 (four) times daily.  Generalized anxiety disorder -     LORazepam (ATIVAN) 1 MG tablet; Take 1 tablet (1 mg total) by mouth 4 (four) times daily.    Greater than 50% of face to face time with patient was spent on counseling and coordination of care. We discussed the following: Beth [name] has treatment resistant bipolar  disorder with psychotic features versus schizoaffective disorder and other psychiatric diagnoses as well.  She has been very difficult to treat in part because she is very sensitive to antipsychotics and yet has chronic auditory hallucinations.    She is chronically anxious and depressed but the level of depression was not severe with depression being mild and anxiety being moderate.  Extensive history of psychiatric meds tried discussed with the patient.   Overall patient is easily overwhelmed with stress.  TR insomnia chronically.  Option Dayvigo, Belsomra if changing the timing of Ambien does not work she can try the samples with the Ambien if necessary.  She has chronic insomnia which is difficult to manage and she will have rebound and insomnia if she attempts to stop the Ambien abruptly.  Discussed Ambien amnesia and  side effects. No change in sleep meds this visit RLS managed right now. RLS managed and neuro checked iron studies reportedly ok.  No 90 day of Ativan or Ambien, though she asked for it.  Lately her auditory hallucinations have stopped.  But we can still consider if needed, Clozapine option.      Option retry CBZ and follow liver enzymes as they were only mildly elevated.Disc DDI complications of CBZ and would need to check liver enzymes DT past history of mild elevations.  There are a lot of medication interactions.  This is the only option that won't worsen RLS except clozapine.   Consider Caplyta   The increase to 6 mg HS doxazosin was successful at managing nightmares. NM stopped after recurring on Abilify.  No  Caffeine after noon.  This is helped.  Zolpidem 10 mg 1 nightly.  Couldn't live without it.  Disc taking on empty stomach and how to deal with it if food on stomach.  Disc withdrawal from Ambien.  Discussed side effects including amnesia.  continue Gabapentin for off label anxiety and RLS at previous dosage.  400 BID and 800 pm.  This has been helpful though has not resolve the problem.  The prognosis for improvement is very guarded from the use of medication.  She is very prone to EPS or restless legs from medications.    Discussed the risk of polypharmacy.  She is on multiple medications.  Agrees to resume Latuda to 30 mg and take in AM with food. Avoid PM to prevent RLS.  For depression  Follow-up 2-3 mos  Lynder Parents MD, DFAPA Please see After Visit Summary for patient specific instructions.  No future appointments.  No orders of the defined types were placed in this encounter.   -------------------------------

## 2021-05-19 ENCOUNTER — Other Ambulatory Visit: Payer: Self-pay | Admitting: Psychiatry

## 2021-05-19 DIAGNOSIS — F411 Generalized anxiety disorder: Secondary | ICD-10-CM

## 2021-05-19 DIAGNOSIS — F315 Bipolar disorder, current episode depressed, severe, with psychotic features: Secondary | ICD-10-CM

## 2021-05-19 DIAGNOSIS — F4310 Post-traumatic stress disorder, unspecified: Secondary | ICD-10-CM

## 2021-05-24 ENCOUNTER — Other Ambulatory Visit: Payer: Self-pay | Admitting: Obstetrics and Gynecology

## 2021-05-24 DIAGNOSIS — Z1231 Encounter for screening mammogram for malignant neoplasm of breast: Secondary | ICD-10-CM

## 2021-06-03 ENCOUNTER — Telehealth: Payer: Self-pay

## 2021-06-03 NOTE — Telephone Encounter (Signed)
Prior Approval received for LATUDA 60 MG effective 05/27/2021-12/14/2021 with [facility] [id number]

## 2021-07-12 ENCOUNTER — Other Ambulatory Visit: Payer: Self-pay | Admitting: Psychiatry

## 2021-07-12 DIAGNOSIS — G2581 Restless legs syndrome: Secondary | ICD-10-CM

## 2021-07-12 NOTE — Telephone Encounter (Signed)
Please review I don't see requip in her previous note to know if this is appropriate or not

## 2021-07-16 ENCOUNTER — Other Ambulatory Visit: Payer: Self-pay

## 2021-07-16 ENCOUNTER — Telehealth: Payer: Self-pay | Admitting: Psychiatry

## 2021-07-16 NOTE — Telephone Encounter (Signed)
sent 

## 2021-07-16 NOTE — Telephone Encounter (Signed)
I tried to send rx and it shows its pended by you

## 2021-07-16 NOTE — Telephone Encounter (Signed)
Pt called in for refill on Ropinirole '4mg'$  an Ropinirole '2mg'$  and Flexeril '10mg'$ . Appt 8/21. Ph [facility] [phone]

## 2021-07-17 ENCOUNTER — Other Ambulatory Visit: Payer: Self-pay | Admitting: Psychiatry

## 2021-07-17 DIAGNOSIS — F515 Nightmare disorder: Secondary | ICD-10-CM

## 2021-07-17 DIAGNOSIS — F4310 Post-traumatic stress disorder, unspecified: Secondary | ICD-10-CM

## 2021-07-17 DIAGNOSIS — G2581 Restless legs syndrome: Secondary | ICD-10-CM

## 2021-07-17 DIAGNOSIS — F4312 Post-traumatic stress disorder, chronic: Secondary | ICD-10-CM

## 2021-07-18 ENCOUNTER — Ambulatory Visit: Payer: Medicare HMO

## 2021-07-19 ENCOUNTER — Inpatient Hospital Stay: Admission: RE | Admit: 2021-07-19 | Payer: Medicare HMO | Source: Ambulatory Visit

## 2021-07-24 ENCOUNTER — Ambulatory Visit
Admission: RE | Admit: 2021-07-24 | Discharge: 2021-07-24 | Disposition: A | Discharge status: Home / Self Care | Payer: Medicare HMO | Source: Ambulatory Visit | Attending: Obstetrics and Gynecology | Admitting: Obstetrics and Gynecology

## 2021-07-24 ENCOUNTER — Other Ambulatory Visit: Payer: Self-pay

## 2021-07-24 DIAGNOSIS — Z1231 Encounter for screening mammogram for malignant neoplasm of breast: Secondary | ICD-10-CM

## 2021-08-12 ENCOUNTER — Ambulatory Visit: Payer: Medicare HMO | Admitting: Psychiatry

## 2021-08-16 ENCOUNTER — Ambulatory Visit (INDEPENDENT_AMBULATORY_CARE_PROVIDER_SITE_OTHER): Payer: Medicare HMO | Admitting: Psychiatry

## 2021-08-16 ENCOUNTER — Other Ambulatory Visit: Payer: Self-pay

## 2021-08-16 ENCOUNTER — Encounter: Payer: Self-pay | Admitting: Psychiatry

## 2021-08-16 DIAGNOSIS — F4310 Post-traumatic stress disorder, unspecified: Secondary | ICD-10-CM

## 2021-08-16 DIAGNOSIS — F515 Nightmare disorder: Secondary | ICD-10-CM

## 2021-08-16 DIAGNOSIS — F99 Mental disorder, not otherwise specified: Secondary | ICD-10-CM | POA: Diagnosis not present

## 2021-08-16 DIAGNOSIS — F5105 Insomnia due to other mental disorder: Secondary | ICD-10-CM | POA: Diagnosis not present

## 2021-08-16 DIAGNOSIS — F315 Bipolar disorder, current episode depressed, severe, with psychotic features: Secondary | ICD-10-CM | POA: Diagnosis not present

## 2021-08-16 DIAGNOSIS — F4312 Post-traumatic stress disorder, chronic: Secondary | ICD-10-CM | POA: Diagnosis not present

## 2021-08-16 DIAGNOSIS — G2581 Restless legs syndrome: Secondary | ICD-10-CM

## 2021-08-16 DIAGNOSIS — F411 Generalized anxiety disorder: Secondary | ICD-10-CM | POA: Diagnosis not present

## 2021-08-16 NOTE — Progress Notes (Signed)
CRUCITA HINH IW:[id number] [birth year] 45 y.o.  Subjective:   Patient ID:  Beth [name] is a 45 y.o. (DOB [birth year]) female.  Chief Complaint:  Chief Complaint  Patient presents with   Follow-up   Depression   Anxiety   Sleeping Problem    HPI Beth [name] presents to the office today for follow-up of the below.   Beth [name] presents to the office today for follow-up of TRD bipolar unstable which is difficult to treat due to med sensitivity and sleep problems.     seen August 25 , 2020.  Encouraged her to split the dose of Depakote ER 500 mg twice daily and get it away from bedtime to see if nausea and vomiting will improve.  She was encouraged to stop caffeine after noon because of insomnia and we switched from Ambien CR to regular Ambien 10 mg. Doing OK overall.     seen November 08, 2019.  She was still having some issues with sleep but was drinking caffeine too late and she was encouraged to stop caffeine after noon.  No other meds were changed.   seen January 18, 2020.  The following was noted: Not good.  Uncle living with them died 2023/03/30.  Had heart problems and apparent MI. Before that was relatively OK.  With new insurance can get generic Saphris for $24 .  It helped her sleep so well.  Doesn't remember how it worked for mood.  But wants to restart it for the sleep benefit and potential mood benefit.  Prior to his death mood swings were OK and anxiety manageable but it won't be that way now.  Has a lot more anxiety and depression.  Crying all the time.  Stressed over it.  She was to start Saphris 5 mg nightly and go up to 10 mg nightly if needed.[name]   April 23rd 2021 appointment, the following is noted: Took one Saphris and had bad RLS the rest of the night.  Asked to increase doxazosin bc NM of uncles death. On Sinemet  mos per neuro. Meds for RLS changed by doctor and it's better.No dizziness with doxazosin.  Reducing caffeine helped RLS. NM mostly controlled...  Taking ambien 10 and doxazosin. Doxazosin helped NM.  Had NM of F every night and would interfere.  Tired of dreaming of him.  They stopped..   Saphris helped her go to sleep.  Before it couldn't get to sleep.  Now average 5 hours sleep.  No naps.  No RLS right now with ropinirole better than pramipexole. Insurance change demand she stop Saphris, Latuda, loxapine is $100/month.  She looked up what she can afford risperidone, Geodon, haloperidol, Tegretol  She can't afford Vraylar. Current stressors $,  Plan: pt wanted to increase doxazosin to 6 mg HS for residual NM.   06/06/20 appt with the following noted: Tolerating meds.   NM controlled.  RLS is still in evening and not much at night. Had good realtionship with father who died 3 years ago.  Had good relationship with uncle who died late 26-Jan-2020. Neurontin and Requip help most of all the meds.   Asked about weight gain with Depakote. Sleep is better with better schedule with husband's job. Overall mood is pretty stable except anniversary events.  10/02/20 appt with the following noted: Don't like Depakote bc more nausea with it.  Starts at night after it.  Takes over an hour to get to sleep.  N gone in the morning.  No RLS in bed but sometimes in the evening.  Mood and hallucinations under control.  Anxiety sometimes and occ panic. Sleep chronic issues.    Helps with sister's kids 3 times weekly. Plan: no med changes  01/29/2021 appointment with following noted: Not going well.  Neuro Dr. [name] [facility]  dropped her as patient and wouldn't refill muscle relaxer, cyclobenzaprine for RLS.  Stress at home and life broken me down.  Wants to get on an actual mood stabilizer.  Very depressed and cycles into irritability.  Has tried to get into the office sooner. Not hearing voices but SI a lot without intent or plan. Hasn't felt this bad in years. No NM.  Nausea even off Depakote. Plan: Retry Abilify 10 daily   02/18/2021 TC:  CO SE NM and  stopped Abilify. Asked to try Latuda and OK trial lower dose 20 mg daily.  03/18/2021 appt noted: CO trouble sleeping after starting Latuda 20 PM. More depressed than she was.  Sometimes irritable but more depression.  Sometimes SI.   Primary stress $, life. Plan:  Increase Latuda to 30 mg and take in AM with food. Avoid PM to prevent RLS.  For depression  04/09/2021 phone call: Patient called stating she wanted to stop the Latuda 30 mg because she felt like it was making her hungry and eat more and trouble sleeping.  Plus she did not feel any depression benefit from that dosage of 30 mg.  05/16/2021 appointment with the following noted: Feeling better  Without SI.  Problems with EFA.  To bed 930 and then up at 4 AM.  No naps.   Caplyta is $500/90 days and Latuda $300/90 days Would like to stay asleep all night.  No initial insomnia. Ropinirole is managing the RLS.  Pleased with the dose. No SE other meds. Taking Ativan 4 mg daily. Still on Depakote.ER 1000 mg but was not on med list. Plan:  Agrees to resume Latuda to 30 mg and take in AM with food. Avoid PM to prevent RLS.  For depression  08/16/21 appt noted: Mo says seems better.  Eating more.  SE facial twitches at times, no one else has said anything about them.   Still depressed without noticeable change from her perspective.  Still no energy. Sleep still with EFA.  Same pattern noted before. RLS unusual at this time.  Still taking Ativan 4 mg daily. Uncertain if lamotrigine or sertraline No hallucinations lately. Does not want to increase the Latuda.  Remote SI but did not hurt herself, was hospitalized.   Prior psychiatric medication trials include Latuda 40 restless legs,  Fanapt which cause restless legs,  Loxapine weight,  Abilfiy 10 NM  Saphris which caused weight gain & RLS, Liked saphris some. olanzapine weight gain,  Geodon,  Seroquel weight gain and restless legs,  risperidone restless legs,  Haloperidol 2 RLS,     loxapine cost and weight,  Latuda helped mood but $ first trial, retrial SE hunger Sinemet, gabapentin. Mirapex, ropinirole Lyrica weight gain, lithium side effects, lamotrigine, Equetro with mildly elevated liver enzymes, Depakote nausea Lexapro, duloxetine, venlafaxine, bupropion, fluoxetine, sertraline,    doxepin ineffective, Restoril lost effect,  Ambien, mirtazapine increased appetite, trazodone restlessness,,   04/18/20 appt with neuro about RLS.    Iron studies were normal.  The neurologist suggested referral to a movement disorder specialist.  We discussed at length that there are occasions in which going too high with a dopamine agonist can actually worsen restless legs  rather than improve it.  She is at a high dose of ropinirole.  She does not want to go to see another doctor about this at this time.  Given that situation it was suggested that she try reducing the evening dose of ropinirole to 6 mg from 8 mg given that her restless legs is only a problem in the evening before she goes to bed and not after.   Review of Systems:  Review of Systems  Cardiovascular:  Negative for palpitations.  Gastrointestinal:  Negative for nausea.  Neurological:  Negative for dizziness, tremors and weakness.       RLS  Psychiatric/Behavioral:  Negative for dysphoric mood.    Medications: I have reviewed the patient's current medications.  Current Outpatient Medications  Medication Sig Dispense Refill   cyclobenzaprine (FLEXERIL) 10 MG tablet TAKE 2 TABLETS AT BEDTIME 180 tablet 0   divalproex (DEPAKOTE ER) 500 MG 24 hr tablet Take 1,000 mg by mouth daily.     doxazosin (CARDURA) 4 MG tablet TAKE 1 AND 1/2 TABLETS AT BEDTIME 135 tablet 0   gabapentin (NEURONTIN) 400 MG capsule TAKE 1 CAPSULE TWICE DAILY  AND TAKE 2 CAPSULES IN THE EVENING 360 capsule 0   lamoTRIgine (LAMICTAL) 200 MG tablet TAKE ONE AND ONE-HALF (1.5) TABLETS BY MOUTH DAILY 135 tablet 1   LORazepam (ATIVAN) 1 MG tablet Take 1  tablet (1 mg total) by mouth 4 (four) times daily. 120 tablet 3   Lurasidone HCl (LATUDA) 60 MG TABS Take 1 tablet (60 mg total) by mouth daily. (Patient taking differently: Take 30 mg by mouth daily.) 90 tablet 0   rOPINIRole (REQUIP) 2 MG tablet TAKE 1 TABLET AT 5 PM. TAKE IN ADDITION TO '8MG'$  AT BEDTIME. 90 tablet 0   rOPINIRole (REQUIP) 4 MG tablet TAKE 2 TABLETS (8 MG TOTAL) AT BEDTIME. 180 tablet 0   sertraline (ZOLOFT) 100 MG tablet TAKE 1 TABLET (100 MG TOTAL) BY MOUTH DAILY. 90 tablet 0   valACYclovir (VALTREX) 500 MG tablet Take one tablet by mouth twice daily for 3-5 days then daily as needed. 90 tablet 4   zolpidem (AMBIEN) 10 MG tablet Take 1 tablet (10 mg total) by mouth at bedtime as needed for sleep. 30 tablet 3   No current facility-administered medications for this visit.    Medication Side Effects: nausea  Allergies: No Known Allergies  Past Medical History:  Diagnosis Date   Bipolar disorder ([place])    HSV (herpes simplex virus) anogenital infection 05/2018   RLS (restless legs syndrome)     Family History  Problem Relation Age of Onset   Thyroid disease Mother    COPD Mother    Bipolar disorder Mother    Parkinson's disease Father    Breast cancer Paternal Grandmother 34   Bipolar disorder Sister    Schizophrenia Other     Social History   Socioeconomic History   Marital status: Married    Spouse name: Not on file   Number of children: 0   Years of education: 16   Highest education level: Associate degree: academic program  Occupational History   Occupation: unemployed  Tobacco Use   Smoking status: Former   Smokeless tobacco: Never  Scientific laboratory technician Use: Never used  Substance and Sexual Activity   Alcohol use: No   Drug use: No   Sexual activity: Yes    Birth control/protection: None    Comment: 1st intercourse 59 yo-5 partners  Other Topics Concern  Not on file  Social History Narrative   Pt is right handed   Lives in single story home  with her husband, mother and uncle   Has associated degree   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    Social Determinants of Health   Financial Resource Strain: Not on file  Food Insecurity: Not on file  Transportation Needs: Not on file  Physical Activity: Not on file  Stress: Not on file  Social Connections: Not on file  Intimate Partner Violence: Not on file    Past Medical History, Surgical history, Social history, and Family history were reviewed and updated as appropriate.   Please see review of systems for further details on the patient's review from today.   Objective:   Physical Exam:  There were no vitals taken for this visit.  Physical Exam Constitutional:      General: She is not in acute distress. Musculoskeletal:        General: No deformity.  Neurological:     Mental Status: She is alert and oriented to person, place, and time.     Coordination: Coordination normal.  Psychiatric:        Attention and Perception: Attention and perception normal. She does not perceive auditory or visual hallucinations.        Mood and Affect: Mood is anxious and depressed. Affect is not labile, blunt, angry or inappropriate.        Speech: Speech normal.        Behavior: Behavior normal.        Thought Content: Thought content normal. Thought content is not paranoid or delusional. Thought content does not include homicidal or suicidal ideation. Thought content does not include homicidal or suicidal plan.        Cognition and Memory: Cognition and memory normal.        Judgment: Judgment normal.     Comments: Insight intact Patient has varying levels of depression and anxiety and more depression lately    Lab Review:     Component Value Date/Time   NA 135 08/28/2020 1128   K 4.8 08/28/2020 1128   CL 104 08/28/2020 1128   CO2 23 08/28/2020 1128   GLUCOSE 94 08/28/2020 1128   BUN 12 08/28/2020 1128   CREATININE 0.73 08/28/2020 1128   CALCIUM 9.0 08/28/2020  1128   PROT 6.5 08/28/2020 1128   ALBUMIN 4.7 03/20/2017 1100   AST 15 08/28/2020 1128   ALT 12 08/28/2020 1128   ALKPHOS 65 03/20/2017 1100   BILITOT 0.4 08/28/2020 1128       Component Value Date/Time   WBC 6.4 08/28/2020 1128   RBC 4.09 08/28/2020 1128   HGB 12.9 08/28/2020 1128   HCT 38.8 08/28/2020 1128   PLT 314 08/28/2020 1128   MCV 94.9 08/28/2020 1128   MCH 31.5 08/28/2020 1128   MCHC 33.2 08/28/2020 1128   RDW 11.8 08/28/2020 1128   LYMPHSABS 2,281 08/25/2019 1114   MONOABS 570 03/20/2017 1100   EOSABS 435 08/25/2019 1114   BASOSABS 101 08/25/2019 1114    No results found for: POCLITH, LITHIUM   No results found for: PHENYTOIN, PHENOBARB, VALPROATE, CBMZ   .res Assessment: Plan:    Beth [name] was seen today for follow-up, depression, anxiety and sleeping problem.  Diagnoses and all orders for this visit:  Bipolar disorder, current episode depressed, severe, with psychotic features ([place])  PTSD (post-traumatic stress disorder)  Generalized anxiety disorder  Insomnia due to other mental  disorder  Nightmares associated with chronic post-traumatic stress disorder  RLS (restless legs syndrome)   Greater than 50% of face to face time with patient was spent on counseling and coordination of care. We discussed the following: Domineque has treatment resistant bipolar disorder with psychotic features versus schizoaffective disorder and other psychiatric diagnoses as well.  She has been very difficult to treat in part because she is very sensitive to antipsychotics and yet has strong history of chronic auditory hallucinations.    She is chronically anxious and depressed.  Extensive history of psychiatric meds tried discussed with the patient.   Overall patient is easily overwhelmed with stress. Lately depression worse than anxiety.   TR insomnia chronically.  Option Dayvigo, Belsomra if changing the timing of Ambien does not work she can try the samples with the Ambien if  necessary.  She has chronic insomnia which is difficult to manage and she will have rebound and insomnia if she attempts to stop the Ambien abruptly.  Discussed Ambien amnesia and side effects. No change in sleep meds this visit RLS managed right now. RLS managed and neuro checked iron studies reportedly ok.  No 90 day of Ativan or Ambien, though she asked for it.   Lately her auditory hallucinations have stopped.  But we can still consider if needed, Clozapine option.        Option retry CBZ and follow liver enzymes as they were only mildly elevated.Disc DDI complications of CBZ and would need to check liver enzymes DT past history of mild elevations.  There are a lot of medication interactions.  This is the only option that won't worsen RLS except clozapine.   Consider Caplyta     The increase to 6 mg HS doxazosin was successful at managing nightmares. NM stopped after recurring on Abilify.   No  Caffeine after noon.  This is helped.    Zolpidem 10 mg 1 nightly.  Couldn't live without it.  Disc taking on empty stomach and how to deal with it if food on stomach.  Disc withdrawal from Ambien.  Discussed side effects including amnesia.   continue Gabapentin for off label anxiety and RLS at previous dosage.  400 BID and 800 pm.  This has been helpful though has not resolve the problem.   The prognosis for improvement is very guarded from the use of medication.  She is very prone to EPS or restless legs from medications.    Discussed the risk of polypharmacy.  She is on multiple medications.  Disc purpose of each of the meds and consider changing some of them. Reduce lamotrigine to 1 daily for 2 weeks, Then reduce to 3/4 of '200mg'$  tablet for 2 weeks, Then reduce to 1/2 tablet for 2 weeks, Then reduce to 1/4 tablet for 2 weeeks, Then stop lamotrigine If gets more depressed call.  Agrees to continue Latuda to 30 mg and take in AM with food. Avoid PM to prevent RLS.  For  depression  Follow-up 2-3 mos  Lynder Parents MD, DFAPA Please see After Visit Summary for patient specific instructions.  Future Appointments  Date Time Provider [place]  09/18/2021 11:00 AM Marny Lowenstein A, NP [facility] None    No orders of the defined types were placed in this encounter.   -------------------------------

## 2021-08-16 NOTE — Patient Instructions (Signed)
Reduce lamotrigine to 1 daily for 2 weeks, Then reduce to 3/4 of '200mg'$  tablet for 2 weeks, Then reduce to 1/2 tablet for 2 weeks, Then reduce to 1/4 tablet for 2 weeeks, Then stop lamotrigine

## 2021-09-11 ENCOUNTER — Other Ambulatory Visit: Payer: Self-pay | Admitting: Psychiatry

## 2021-09-11 DIAGNOSIS — F315 Bipolar disorder, current episode depressed, severe, with psychotic features: Secondary | ICD-10-CM

## 2021-09-18 ENCOUNTER — Encounter: Payer: Self-pay | Admitting: Nurse Practitioner

## 2021-09-18 ENCOUNTER — Ambulatory Visit (INDEPENDENT_AMBULATORY_CARE_PROVIDER_SITE_OTHER): Payer: Medicare HMO | Admitting: Nurse Practitioner

## 2021-09-18 ENCOUNTER — Other Ambulatory Visit: Payer: Self-pay

## 2021-09-18 ENCOUNTER — Other Ambulatory Visit (HOSPITAL_COMMUNITY)
Admission: RE | Admit: 2021-09-18 | Discharge: 2021-09-18 | Disposition: A | Discharge status: Home / Self Care | Payer: Medicare HMO | Source: Ambulatory Visit | Attending: Nurse Practitioner | Admitting: Nurse Practitioner

## 2021-09-18 VITALS — BP 114/70 | Ht 67.0 in | Wt 170.0 lb

## 2021-09-18 DIAGNOSIS — Z01419 Encounter for gynecological examination (general) (routine) without abnormal findings: Secondary | ICD-10-CM | POA: Insufficient documentation

## 2021-09-18 DIAGNOSIS — Z1151 Encounter for screening for human papillomavirus (HPV): Secondary | ICD-10-CM | POA: Diagnosis not present

## 2021-09-18 DIAGNOSIS — N951 Menopausal and female climacteric states: Secondary | ICD-10-CM

## 2021-09-18 DIAGNOSIS — A609 Anogenital herpesviral infection, unspecified: Secondary | ICD-10-CM

## 2021-09-18 MED ORDER — VALACYCLOVIR HCL 500 MG PO TABS: ORAL_TABLET | ORAL | 1 refills | Status: DC

## 2021-09-18 NOTE — Progress Notes (Signed)
   MAHNOOR MATHISEN [birth year] [id number]   History:  45 y.o. G0 presents for breast and pelvic exam. Has started having irregular cycles with hot flashes. Normal pap and mammogram history. History of bipolar disorder, HSV - occasional outbreaks.  Gynecologic History Patient's last menstrual period was 09/11/2021. Period Duration (Days): 4 Period Pattern: (!) Irregular Menstrual Flow: Moderate, Heavy Dysmenorrhea: (!) Severe Dysmenorrhea Symptoms: Cramping Contraception/Family planning: none Sexually active: Yes  Health Maintenance Last Pap: 03/20/2017. Results were: Normal Last mammogram: 07/24/2021. Results were: Normal Last colonoscopy: Never Last Dexa: Not indicated  Past medical history, past surgical history, family history and social history were all reviewed and documented in the EPIC chart. Married.  ROS:  A ROS was performed and pertinent positives and negatives are included.  Exam:  Vitals:   09/18/21 1059  BP: 114/70  Weight: 170 lb (77.1 kg)  Height: 5\' 7"  (1.702 m)   Body mass index is 26.63 kg/m.  General appearance:  Normal Thyroid:  Symmetrical, normal in size, without palpable masses or nodularity. Respiratory  Auscultation:  Clear without wheezing or rhonchi Cardiovascular  Auscultation:  Regular rate, without rubs, murmurs or gallops  Edema/varicosities:  Not grossly evident Abdominal  Soft,nontender, without masses, guarding or rebound.  Liver/spleen:  No organomegaly noted  Hernia:  None appreciated  Skin  Inspection:  Grossly normal Breasts: Examined lying and sitting.   Right: Without masses, retractions, nipple discharge or axillary adenopathy.   Left: Without masses, retractions, nipple discharge or axillary adenopathy. Genitourinary   Inguinal/mons:  Normal without inguinal adenopathy  External genitalia:  Normal appearing vulva with no masses, tenderness, or lesions  BUS/Urethra/Skene's glands:  Normal  Vagina:  Normal appearing with  normal color and discharge, no lesions  Cervix:  Normal appearing without discharge or lesions  Uterus:  Normal in size, shape and contour.  Midline and mobile, nontender  Adnexa/parametria:     Rt: Normal in size, without masses or tenderness.   Lt: Normal in size, without masses or tenderness.  Anus and perineum: Normal  Patient informed chaperone available to be present for breast and pelvic exam. Patient has requested no chaperone to be present. Patient has been advised what will be completed during breast and pelvic exam.   Assessment/Plan:  45 y.o. G0 for breast and pelvic exam.   Well female exam with routine gynecological exam - Plan: Cytology - PAP( [facility]), CBC with Differential/Platelet, Comprehensive metabolic panel, Lipid panel. Education provided on SBEs, importance of preventative screenings, current guidelines, high calcium diet, regular exercise, and multivitamin daily.   HSV (herpes simplex virus) anogenital infection - Plan: valACYclovir (VALTREX) 500 MG tablet for 3-5 days at first sign of outbreak. Outbreaks occasionally, prefers to take as needed. Refill provided.   Perimenopause - discussed normal changes in menstrual bleeding pattern with perimenopause and common symptoms. If irregular or frequent bleeding becomes bothersome we did discuss option for hormonal contraception for management. She is not interested at this time.   Screening for cervical cancer - Normal Pap history. Pap due today.  Screening for breast cancer - Normal mammogram history.  Continue annual screenings.  Normal breast exam today.  Screening for colon cancer - Discussed current guidelines and importance of preventative screenings. Provided her with information on [facility].   Return in 1 year for annual.   Tamela Gammon DNP, 11:20 AM 09/18/2021'

## 2021-09-18 NOTE — Patient Instructions (Signed)
[facility] [phone] [place], [place] [id number]

## 2021-09-19 ENCOUNTER — Other Ambulatory Visit: Payer: Self-pay | Admitting: Psychiatry

## 2021-09-19 DIAGNOSIS — F411 Generalized anxiety disorder: Secondary | ICD-10-CM

## 2021-09-19 DIAGNOSIS — F4310 Post-traumatic stress disorder, unspecified: Secondary | ICD-10-CM

## 2021-09-19 LAB — COMPREHENSIVE METABOLIC PANEL
AG Ratio: 2 (calc) (ref 1.0–2.5)
ALT: 8 U/L (ref 6–29)
AST: 14 U/L (ref 10–35)
Albumin: 4.4 g/dL (ref 3.6–5.1)
Alkaline phosphatase (APISO): 51 U/L (ref 31–125)
BUN: 13 mg/dL (ref 7–25)
CO2: 27 mmol/L (ref 20–32)
Calcium: 9.4 mg/dL (ref 8.6–10.2)
Chloride: 105 mmol/L (ref 98–110)
Creat: 0.67 mg/dL (ref 0.50–0.99)
Globulin: 2.2 g/dL (calc) (ref 1.9–3.7)
Glucose, Bld: 87 mg/dL (ref 65–99)
Potassium: 5.2 mmol/L (ref 3.5–5.3)
Sodium: 141 mmol/L (ref 135–146)
Total Bilirubin: 0.3 mg/dL (ref 0.2–1.2)
Total Protein: 6.6 g/dL (ref 6.1–8.1)

## 2021-09-19 LAB — CBC WITH DIFFERENTIAL/PLATELET
Absolute Monocytes: 393 cells/uL (ref 200–950)
Basophils Absolute: 91 cells/uL (ref 0–200)
Basophils Relative: 1.6 %
Eosinophils Absolute: 251 cells/uL (ref 15–500)
Eosinophils Relative: 4.4 %
HCT: 37.2 % (ref 35.0–45.0)
Hemoglobin: 12.5 g/dL (ref 11.7–15.5)
Lymphs Abs: 1995 cells/uL (ref 850–3900)
MCH: 32.1 pg (ref 27.0–33.0)
MCHC: 33.6 g/dL (ref 32.0–36.0)
MCV: 95.6 fL (ref 80.0–100.0)
MPV: 11.3 fL (ref 7.5–12.5)
Monocytes Relative: 6.9 %
Neutro Abs: 2970 cells/uL (ref 1500–7800)
Neutrophils Relative %: 52.1 %
Platelets: 267 10*3/uL (ref 140–400)
RBC: 3.89 10*6/uL (ref 3.80–5.10)
RDW: 11.2 % (ref 11.0–15.0)
Total Lymphocyte: 35 %
WBC: 5.7 10*3/uL (ref 3.8–10.8)

## 2021-09-19 LAB — CYTOLOGY - PAP
Comment: NEGATIVE
Diagnosis: NEGATIVE
High risk HPV: NEGATIVE

## 2021-09-19 LAB — LIPID PANEL
Cholesterol: 205 mg/dL — ABNORMAL HIGH (ref <200)
HDL: 50 mg/dL (ref >=50)
LDL Cholesterol (Calc): 132 mg/dL (calc) — ABNORMAL HIGH
Non-HDL Cholesterol (Calc): 155 mg/dL (calc) — ABNORMAL HIGH (ref <130)
Total CHOL/HDL Ratio: 4.1 (calc) (ref <5.0)
Triglycerides: 123 mg/dL (ref <150)

## 2021-10-10 ENCOUNTER — Other Ambulatory Visit: Payer: Self-pay | Admitting: Psychiatry

## 2021-10-10 DIAGNOSIS — F99 Mental disorder, not otherwise specified: Secondary | ICD-10-CM

## 2021-10-10 DIAGNOSIS — F5105 Insomnia due to other mental disorder: Secondary | ICD-10-CM

## 2021-10-10 NOTE — Telephone Encounter (Signed)
Last filled 9/26 appt on 11/8

## 2021-10-14 ENCOUNTER — Other Ambulatory Visit: Payer: Self-pay | Admitting: Psychiatry

## 2021-10-14 DIAGNOSIS — F315 Bipolar disorder, current episode depressed, severe, with psychotic features: Secondary | ICD-10-CM

## 2021-10-14 DIAGNOSIS — F411 Generalized anxiety disorder: Secondary | ICD-10-CM

## 2021-10-14 DIAGNOSIS — F4310 Post-traumatic stress disorder, unspecified: Secondary | ICD-10-CM

## 2021-10-14 NOTE — Telephone Encounter (Signed)
90 day ok?

## 2021-10-22 ENCOUNTER — Other Ambulatory Visit: Payer: Self-pay | Admitting: Psychiatry

## 2021-10-22 ENCOUNTER — Other Ambulatory Visit: Payer: Self-pay

## 2021-10-22 ENCOUNTER — Ambulatory Visit (INDEPENDENT_AMBULATORY_CARE_PROVIDER_SITE_OTHER): Payer: Medicare HMO | Admitting: Psychiatry

## 2021-10-22 ENCOUNTER — Encounter: Payer: Self-pay | Admitting: Psychiatry

## 2021-10-22 DIAGNOSIS — G2581 Restless legs syndrome: Secondary | ICD-10-CM | POA: Diagnosis not present

## 2021-10-22 DIAGNOSIS — F515 Nightmare disorder: Secondary | ICD-10-CM | POA: Diagnosis not present

## 2021-10-22 DIAGNOSIS — F5101 Primary insomnia: Secondary | ICD-10-CM

## 2021-10-22 DIAGNOSIS — F411 Generalized anxiety disorder: Secondary | ICD-10-CM | POA: Diagnosis not present

## 2021-10-22 DIAGNOSIS — F4310 Post-traumatic stress disorder, unspecified: Secondary | ICD-10-CM

## 2021-10-22 DIAGNOSIS — F4312 Post-traumatic stress disorder, chronic: Secondary | ICD-10-CM | POA: Diagnosis not present

## 2021-10-22 DIAGNOSIS — F99 Mental disorder, not otherwise specified: Secondary | ICD-10-CM | POA: Diagnosis not present

## 2021-10-22 DIAGNOSIS — F315 Bipolar disorder, current episode depressed, severe, with psychotic features: Secondary | ICD-10-CM | POA: Diagnosis not present

## 2021-10-22 DIAGNOSIS — F5105 Insomnia due to other mental disorder: Secondary | ICD-10-CM

## 2021-10-22 NOTE — Progress Notes (Signed)
Beth [name] [id number] 1976-09-03 45 y.o.  Subjective:   Patient ID:  Beth [name] is a 45 y.o. (DOB [birth year]) female.  Chief Complaint:  Chief Complaint  Patient presents with   Follow-up   Depression   Manic Behavior   Sleeping Problem    HPI Beth [name] presents to the office today for follow-up of the below.   Beth [name] presents to the office today for follow-up of TRD bipolar unstable which is difficult to treat due to med sensitivity and sleep problems.     seen August 25 , 2020.  Encouraged her to split the dose of Depakote ER 500 mg twice daily and get it away from bedtime to see if nausea and vomiting will improve.  She was encouraged to stop caffeine after noon because of insomnia and we switched from Ambien CR to regular Ambien 10 mg. Doing OK overall.     seen November 08, 2019.  She was still having some issues with sleep but was drinking caffeine too late and she was encouraged to stop caffeine after noon.  No other meds were changed.   seen January 18, 2020.  The following was noted: Not good.  Uncle living with them died 03/21/23.  Had heart problems and apparent MI. Before that was relatively OK.  With new insurance can get generic Saphris for $24 .  It helped her sleep so well.  Doesn't remember how it worked for mood.  But wants to restart it for the sleep benefit and potential mood benefit.  Prior to his death mood swings were OK and anxiety manageable but it won't be that way now.  Has a lot more anxiety and depression.  Crying all the time.  Stressed over it.  She was to start Saphris 5 mg nightly and go up to 10 mg nightly if needed.[name]   April 23rd 2021 appointment, the following is noted: Took one Saphris and had bad RLS the rest of the night.  Asked to increase doxazosin bc NM of uncles death. On Sinemet  mos per neuro. Meds for RLS changed by doctor and it's better.No dizziness with doxazosin.  Reducing caffeine helped RLS. NM mostly  controlled... Taking ambien 10 and doxazosin. Doxazosin helped NM.  Had NM of F every night and would interfere.  Tired of dreaming of him.  They stopped..   Saphris helped her go to sleep.  Before it couldn't get to sleep.  Now average 5 hours sleep.  No naps.  No RLS right now with ropinirole better than pramipexole. Insurance change demand she stop Saphris, Latuda, loxapine is $100/month.  She looked up what she can afford risperidone, Geodon, haloperidol, Tegretol  She can't afford Vraylar. Current stressors $,  Plan: pt wanted to increase doxazosin to 6 mg HS for residual NM.   06/06/20 appt with the following noted: Tolerating meds.   NM controlled.  RLS is still in evening and not much at night. Had good realtionship with father who died 3 years ago.  Had good relationship with uncle who died late Jan 15, 2020. Neurontin and Requip help most of all the meds.   Asked about weight gain with Depakote. Sleep is better with better schedule with husband's job. Overall mood is pretty stable except anniversary events.  10/02/20 appt with the following noted: Don't like Depakote bc more nausea with it.  Starts at night after it.  Takes over an hour to get to sleep.  N gone in the morning.  No RLS in bed but sometimes in the evening.  Mood and hallucinations under control.  Anxiety sometimes and occ panic. Sleep chronic issues.    Helps with sister's kids 3 times weekly. Plan: no med changes  01/29/2021 appointment with following noted: Not going well.  Neuro Dr. [name] [facility]  dropped her as patient and wouldn't refill muscle relaxer, cyclobenzaprine for RLS.  Stress at home and life broken me down.  Wants to get on an actual mood stabilizer.  Very depressed and cycles into irritability.  Has tried to get into the office sooner. Not hearing voices but SI a lot without intent or plan. Hasn't felt this bad in years. No NM.  Nausea even off Depakote. Plan: Retry Abilify 10 daily   02/18/2021 TC:   CO SE NM and stopped Abilify. Asked to try Latuda and OK trial lower dose 20 mg daily.  03/18/2021 appt noted: CO trouble sleeping after starting Latuda 20 PM. More depressed than she was.  Sometimes irritable but more depression.  Sometimes SI.   Primary stress $, life. Plan:  Increase Latuda to 30 mg and take in AM with food. Avoid PM to prevent RLS.  For depression  04/09/2021 phone call: Patient called stating she wanted to stop the Latuda 30 mg because she felt like it was making her hungry and eat more and trouble sleeping.  Plus she did not feel any depression benefit from that dosage of 30 mg.  05/16/2021 appointment with the following noted: Feeling better  Without SI.  Problems with EFA.  To bed 930 and then up at 4 AM.  No naps.   Caplyta is $500/90 days and Latuda $300/90 days Would like to stay asleep all night.  No initial insomnia. Ropinirole is managing the RLS.  Pleased with the dose. No SE other meds. Taking Ativan 4 mg daily. Still on Depakote.ER 1000 mg but was not on med list. Plan:  Agrees to resume Latuda to 30 mg and take in AM with food. Avoid PM to prevent RLS.  For depression  08/16/21 appt noted: Mo says seems better.  Eating more.  SE facial twitches at times, no one else has said anything about them.   Still depressed without noticeable change from her perspective.  Still no energy. Sleep still with EFA.  Same pattern noted before. RLS unusual at this time.  Still taking Ativan 4 mg daily. Uncertain if lamotrigine or sertraline No hallucinations lately. Does not want to increase the Latuda. Due to polypharmacy, wean lamotrigine  10/22/2021 appointment with the following noted: Increased cycling with DC lamotrigine. Angry a lot. Wants to try Caplyta and it's affordable. I don't like the Latuda bc eating more and EFA. Too hungry with Latuda  Remote SI but did not hurt herself, was hospitalized.   Prior psychiatric medication trials include Latuda 40 restless  legs,  Fanapt which cause restless legs,  Loxapine weight,  Abilfiy 10 NM  Saphris which caused weight gain & RLS, Liked saphris some. olanzapine weight gain,  Geodon,  Seroquel weight gain and restless legs,  risperidone restless legs,  Haloperidol 2 RLS,    loxapine cost and weight,  Latuda helped mood but $ first trial, retrial SE hunger 30 Sinemet, gabapentin. Mirapex, ropinirole Lyrica weight gain, lithium side effects, lamotrigine, Equetro with mildly elevated liver enzymes, Depakote nausea Lexapro, duloxetine, venlafaxine, bupropion, fluoxetine, sertraline,    doxepin ineffective, Restoril lost effect,  Ambien, mirtazapine increased appetite, trazodone restlessness,,   04/18/20 appt with neuro  about RLS.    Iron studies were normal.  The neurologist suggested referral to a movement disorder specialist.  We discussed at length that there are occasions in which going too high with a dopamine agonist can actually worsen restless legs rather than improve it.  She is at a high dose of ropinirole.  She does not want to go to see another doctor about this at this time.  Given that situation it was suggested that she try reducing the evening dose of ropinirole to 6 mg from 8 mg given that her restless legs is only a problem in the evening before she goes to bed and not after.   Review of Systems:  Review of Systems  Cardiovascular:  Negative for chest pain and palpitations.  Gastrointestinal:  Negative for nausea.  Neurological:  Negative for dizziness, tremors and weakness.       RLS  Psychiatric/Behavioral:  Positive for sleep disturbance. Negative for dysphoric mood. The patient is nervous/anxious.    Medications: I have reviewed the patient's current medications.  Current Outpatient Medications  Medication Sig Dispense Refill   cyclobenzaprine (FLEXERIL) 10 MG tablet TAKE 2 TABLETS AT BEDTIME 180 tablet 0   divalproex (DEPAKOTE ER) 500 MG 24 hr tablet Take 1,000 mg by mouth daily.      doxazosin (CARDURA) 4 MG tablet TAKE 1 AND 1/2 TABLETS AT BEDTIME 135 tablet 0   gabapentin (NEURONTIN) 400 MG capsule TAKE 1 CAPSULE TWICE DAILY  AND TAKE 2 CAPSULES IN THE EVENING 360 capsule 0   Lurasidone HCl (LATUDA) 60 MG TABS Take 1 tablet (60 mg total) by mouth daily. (Patient taking differently: Take 30 mg by mouth daily.) 90 tablet 0   rOPINIRole (REQUIP) 2 MG tablet TAKE 1 TABLET AT 5 PM. TAKE IN ADDITION TO 8MG  AT BEDTIME. 90 tablet 0   rOPINIRole (REQUIP) 4 MG tablet TAKE 2 TABLETS (8 MG TOTAL) AT BEDTIME. 180 tablet 0   sertraline (ZOLOFT) 100 MG tablet TAKE 1 TABLET EVERY DAY 90 tablet 0   valACYclovir (VALTREX) 500 MG tablet Take one tablet by mouth twice daily for 3-5 days then daily as needed. 90 tablet 1   zolpidem (AMBIEN) 10 MG tablet TAKE ONE TABLET BY MOUTH EVERY NIGHT AT BEDTIME AS NEEDED FOR SLEEP 30 tablet 0   lamoTRIgine (LAMICTAL) 200 MG tablet TAKE 1 AND 1/2 TABLETS EVERY DAY (Patient not taking: Reported on 10/22/2021) 135 tablet 0   LORazepam (ATIVAN) 1 MG tablet TAKE ONE TABLET BY MOUTH FOUR TIMES A DAY 120 tablet 1   No current facility-administered medications for this visit.    Medication Side Effects: nausea  Allergies: No Known Allergies  Past Medical History:  Diagnosis Date   Bipolar disorder ([place])    HSV (herpes simplex virus) anogenital infection 05/2018   RLS (restless legs syndrome)     Family History  Problem Relation Age of Onset   Thyroid disease Mother    COPD Mother    Bipolar disorder Mother    Parkinson's disease Father    Breast cancer Paternal Grandmother 71   Bipolar disorder Sister    Schizophrenia Other     Social History   Socioeconomic History   Marital status: Married    Spouse name: Not on file   Number of children: 0   Years of education: 16   Highest education level: Associate degree: academic program  Occupational History   Occupation: unemployed  Tobacco Use   Smoking status: Former   Smokeless tobacco:  Never  Vaping Use   Vaping Use: Never used  Substance and Sexual Activity   Alcohol use: No   Drug use: No   Sexual activity: Yes    Birth control/protection: None    Comment: 1st intercourse 66 yo-5 partners  Other Topics Concern   Not on file  Social History Narrative   Pt is right handed   Lives in single story home with her husband, mother and uncle   Has associated degree   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    Social Determinants of Health   Financial Resource Strain: Not on file  Food Insecurity: Not on file  Transportation Needs: Not on file  Physical Activity: Not on file  Stress: Not on file  Social Connections: Not on file  Intimate Partner Violence: Not on file    Past Medical History, Surgical history, Social history, and Family history were reviewed and updated as appropriate.   Please see review of systems for further details on the patient's review from today.   Objective:   Physical Exam:  There were no vitals taken for this visit.  Physical Exam Constitutional:      General: She is not in acute distress. Musculoskeletal:        General: No deformity.  Neurological:     Mental Status: She is alert and oriented to person, place, and time.     Coordination: Coordination normal.  Psychiatric:        Attention and Perception: Attention and perception normal. She does not perceive auditory or visual hallucinations.        Mood and Affect: Mood is anxious and depressed. Affect is not labile, blunt, angry, tearful or inappropriate.        Speech: Speech normal.        Behavior: Behavior normal.        Thought Content: Thought content normal. Thought content is not paranoid or delusional. Thought content does not include homicidal or suicidal ideation.        Cognition and Memory: Cognition and memory normal.        Judgment: Judgment normal.     Comments: Insight intact Patient has more cycling    Lab Review:     Component Value  Date/Time   NA 141 09/18/2021 1125   K 5.2 09/18/2021 1125   CL 105 09/18/2021 1125   CO2 27 09/18/2021 1125   GLUCOSE 87 09/18/2021 1125   BUN 13 09/18/2021 1125   CREATININE 0.67 09/18/2021 1125   CALCIUM 9.4 09/18/2021 1125   PROT 6.6 09/18/2021 1125   ALBUMIN 4.7 03/20/2017 1100   AST 14 09/18/2021 1125   ALT 8 09/18/2021 1125   ALKPHOS 65 03/20/2017 1100   BILITOT 0.3 09/18/2021 1125       Component Value Date/Time   WBC 5.7 09/18/2021 1125   RBC 3.89 09/18/2021 1125   HGB 12.5 09/18/2021 1125   HCT 37.2 09/18/2021 1125   PLT 267 09/18/2021 1125   MCV 95.6 09/18/2021 1125   MCH 32.1 09/18/2021 1125   MCHC 33.6 09/18/2021 1125   RDW 11.2 09/18/2021 1125   LYMPHSABS 1,995 09/18/2021 1125   MONOABS 570 03/20/2017 1100   EOSABS 251 09/18/2021 1125   BASOSABS 91 09/18/2021 1125    No results found for: POCLITH, LITHIUM   No results found for: PHENYTOIN, PHENOBARB, VALPROATE, CBMZ   .res Assessment: Plan:    Beth [name] was seen today for follow-up, depression, manic behavior and sleeping  problem.  Diagnoses and all orders for this visit:  Bipolar disorder, current episode depressed, severe, with psychotic features ([place])  PTSD (post-traumatic stress disorder)  Generalized anxiety disorder  Insomnia due to other mental disorder  Nightmares associated with chronic post-traumatic stress disorder  RLS (restless legs syndrome)  Primary insomnia   Greater than 50% of face to face time with patient was spent on counseling and coordination of care. We discussed the following: Jahlisa has treatment resistant bipolar disorder with psychotic features versus schizoaffective disorder and other psychiatric diagnoses as well.  She has been very difficult to treat in part because she is very sensitive to antipsychotics and yet has strong history of chronic auditory hallucinations.    She is chronically anxious and depressed.  Extensive history of psychiatric meds tried discussed with  the patient.   Overall patient is easily overwhelmed with stress. Lately depression worse than anxiety. More mood cycling off lamotrigine. Wants to try Caplyta but cost concerns. Alternative Vraylar.   TR insomnia chronically.  Option Dayvigo, Belsomra if changing the timing of Ambien does not work she can try the samples with the Ambien if necessary.  She has chronic insomnia which is difficult to manage and she will have rebound and insomnia if she attempts to stop the Ambien abruptly.  Discussed Ambien amnesia and side effects. No change in sleep meds this visit RLS managed right now. RLS managed and neuro checked iron studies reportedly ok.  No 90 day of Ativan or Ambien, though she asked for it.   Lately her auditory hallucinations have stopped.  But we can still consider if needed, Clozapine option.        Option retry CBZ and follow liver enzymes as they were only mildly elevated.Disc DDI complications of CBZ and would need to check liver enzymes DT past history of mild elevations.  There are a lot of medication interactions.  This is the only option that won't worsen RLS except clozapine.   Consider Caplyta or Vraylar.    The increase to 6 mg HS doxazosin was successful at managing nightmares. NM stopped after recurring on Abilify.   No  Caffeine after noon.  This is helped.    Zolpidem 10 mg 1 nightly.  Couldn't live without it.  Disc taking on empty stomach and how to deal with it if food on stomach.  Disc withdrawal from Ambien.  Discussed side effects including amnesia.   continue Gabapentin for off label anxiety and RLS at previous dosage.  400 BID and 800 pm.  This has been helpful though has not resolve the problem.   The prognosis for improvement is very guarded from the use of medication.  She is very prone to EPS or restless legs from medications.    Discussed the risk of polypharmacy.  She is on multiple medications.  Disc purpose of each of the meds and consider  changing some of them.  If gets more depressed call.  Per her request.  Anette Guarneri because of inadequate response and she is concerned about weight gain.  We discussed the options extensively between [place] versus Vraylar.  Discussed the side effects of each in detail. She will start Caplyta 42 mg daily.  If she has side effects that are intolerable then she will start Vraylar 1.5 mg daily in place of Latuda and Caplyta.  Discussed potential metabolic side effects associated with atypical antipsychotics, as well as potential risk for movement side effects. Advised pt to contact office if movement side effects  occur.   Follow-up 2-3 mos  Lynder Parents MD, DFAPA Please see After Visit Summary for patient specific instructions.  Future Appointments  Date Time Provider [place]  12/03/2021  2:30 PM Cottle, Billey Co., MD [facility] None     No orders of the defined types were placed in this encounter.   -------------------------------

## 2021-10-24 ENCOUNTER — Other Ambulatory Visit: Payer: Self-pay | Admitting: Psychiatry

## 2021-11-04 ENCOUNTER — Telehealth: Payer: Self-pay | Admitting: Psychiatry

## 2021-11-04 NOTE — Telephone Encounter (Signed)
Informed pt to come get samples

## 2021-11-04 NOTE — Telephone Encounter (Signed)
Pt called and said that the samples of the vraqylar 1.5 mg are working . She needs more samples to get her to her next appt which is 12/20

## 2021-11-11 ENCOUNTER — Other Ambulatory Visit: Payer: Self-pay | Admitting: Psychiatry

## 2021-11-11 DIAGNOSIS — F5105 Insomnia due to other mental disorder: Secondary | ICD-10-CM

## 2021-11-19 ENCOUNTER — Other Ambulatory Visit: Payer: Self-pay | Admitting: Psychiatry

## 2021-11-25 ENCOUNTER — Other Ambulatory Visit: Payer: Self-pay

## 2021-11-25 ENCOUNTER — Encounter: Payer: Self-pay | Admitting: Psychiatry

## 2021-11-25 ENCOUNTER — Ambulatory Visit (INDEPENDENT_AMBULATORY_CARE_PROVIDER_SITE_OTHER): Payer: Medicare HMO | Admitting: Psychiatry

## 2021-11-25 DIAGNOSIS — F4312 Post-traumatic stress disorder, chronic: Secondary | ICD-10-CM

## 2021-11-25 DIAGNOSIS — F515 Nightmare disorder: Secondary | ICD-10-CM

## 2021-11-25 DIAGNOSIS — F411 Generalized anxiety disorder: Secondary | ICD-10-CM | POA: Diagnosis not present

## 2021-11-25 DIAGNOSIS — G2581 Restless legs syndrome: Secondary | ICD-10-CM | POA: Diagnosis not present

## 2021-11-25 DIAGNOSIS — F99 Mental disorder, not otherwise specified: Secondary | ICD-10-CM

## 2021-11-25 DIAGNOSIS — F315 Bipolar disorder, current episode depressed, severe, with psychotic features: Secondary | ICD-10-CM | POA: Diagnosis not present

## 2021-11-25 DIAGNOSIS — F4310 Post-traumatic stress disorder, unspecified: Secondary | ICD-10-CM | POA: Diagnosis not present

## 2021-11-25 DIAGNOSIS — F5105 Insomnia due to other mental disorder: Secondary | ICD-10-CM

## 2021-11-25 MED ORDER — LORAZEPAM 1 MG PO TABS: 1.0000 mg | ORAL_TABLET | Freq: Four times a day (QID) | ORAL | 1 refills | Status: DC

## 2021-11-25 MED ORDER — CARIPRAZINE HCL 3 MG PO CAPS: ORAL_CAPSULE | ORAL | 1 refills | Status: DC

## 2021-11-25 NOTE — Progress Notes (Signed)
Beth [name] [id number] 01/24/1976 45 y.o.  Subjective:   Patient ID:  Beth [name] is a 45 y.o. (DOB 06-21-1976) female.  Chief Complaint:  Chief Complaint  Patient presents with   Follow-up    Bipolar disorder, current episode depressed, severe, with psychotic features ([place])    HPI [name] presents to the office today for follow-up of the below.   [name] presents to the office today for follow-up of TRD bipolar unstable which is difficult to treat due to med sensitivity and sleep problems.     seen August 25 , 2020.  Encouraged her to split the dose of Depakote ER 500 mg twice daily and get it away from bedtime to see if nausea and vomiting will improve.  She was encouraged to stop caffeine after noon because of insomnia and we switched from Ambien CR to regular Ambien 10 mg. Doing OK overall.     seen November 08, 2019.  She was still having some issues with sleep but was drinking caffeine too late and she was encouraged to stop caffeine after noon.  No other meds were changed.   seen January 18, 2020.  The following was noted: Not good.  Uncle living with them died 03-26-2023.  Had heart problems and apparent MI. Before that was relatively OK.  With new insurance can get generic Saphris for $24 .  It helped her sleep so well.  Doesn't remember how it worked for mood.  But wants to restart it for the sleep benefit and potential mood benefit.  Prior to his death mood swings were OK and anxiety manageable but it won't be that way now.  Has a lot more anxiety and depression.  Crying all the time.  Stressed over it.  She was to start Saphris 5 mg nightly and go up to 10 mg nightly if needed.[name]   April 23rd 2021 appointment, the following is noted: Took one Saphris and had bad RLS the rest of the night.  Asked to increase doxazosin bc NM of uncles death. On Sinemet  mos per neuro. Meds for RLS changed by doctor and it's better.No dizziness with doxazosin.  Reducing  caffeine helped RLS. NM mostly controlled... Taking ambien 10 and doxazosin. Doxazosin helped NM.  Had NM of F every night and would interfere.  Tired of dreaming of him.  They stopped..   Saphris helped her go to sleep.  Before it couldn't get to sleep.  Now average 5 hours sleep.  No naps.  No RLS right now with ropinirole better than pramipexole. Insurance change demand she stop Saphris, Latuda, loxapine is $100/month.  She looked up what she can afford risperidone, Geodon, haloperidol, Tegretol  She can't afford Vraylar. Current stressors $,  Plan: pt wanted to increase doxazosin to 6 mg HS for residual NM.   06/06/20 appt with the following noted: Tolerating meds.   NM controlled.  RLS is still in evening and not much at night. Had good realtionship with father who died 3 years ago.  Had good relationship with uncle who died late 01-26-2020. Neurontin and Requip help most of all the meds.   Asked about weight gain with Depakote. Sleep is better with better schedule with husband's job. Overall mood is pretty stable except anniversary events.  10/02/20 appt with the following noted: Don't like Depakote bc more nausea with it.  Starts at night after it.  Takes over an hour to get to sleep.  N gone in the  morning.   No RLS in bed but sometimes in the evening.  Mood and hallucinations under control.  Anxiety sometimes and occ panic. Sleep chronic issues.    Helps with sister's kids 3 times weekly. Plan: no med changes  01/29/2021 appointment with following noted: Not going well.  Neuro Dr. [name] [facility]  dropped her as patient and wouldn't refill muscle relaxer, cyclobenzaprine for RLS.  Stress at home and life broken me down.  Wants to get on an actual mood stabilizer.  Very depressed and cycles into irritability.  Has tried to get into the office sooner. Not hearing voices but SI a lot without intent or plan. Hasn't felt this bad in years. No NM.  Nausea even off Depakote. Plan: Retry  Abilify 10 daily   02/18/2021 TC:  CO SE NM and stopped Abilify. Asked to try Latuda and OK trial lower dose 20 mg daily.  03/18/2021 appt noted: CO trouble sleeping after starting Latuda 20 PM. More depressed than she was.  Sometimes irritable but more depression.  Sometimes SI.   Primary stress $, life. Plan:  Increase Latuda to 30 mg and take in AM with food. Avoid PM to prevent RLS.  For depression  04/09/2021 phone call: Patient called stating she wanted to stop the Latuda 30 mg because she felt like it was making her hungry and eat more and trouble sleeping.  Plus she did not feel any depression benefit from that dosage of 30 mg.  05/16/2021 appointment with the following noted: Feeling better  Without SI.  Problems with EFA.  To bed 930 and then up at 4 AM.  No naps.   Caplyta is $500/90 days and Latuda $300/90 days Would like to stay asleep all night.  No initial insomnia. Ropinirole is managing the RLS.  Pleased with the dose. No SE other meds. Taking Ativan 4 mg daily. Still on Depakote.ER 1000 mg but was not on med list. Plan:  Agrees to resume Latuda to 30 mg and take in AM with food. Avoid PM to prevent RLS.  For depression  08/16/21 appt noted: Mo says seems better.  Eating more.  SE facial twitches at times, no one else has said anything about them.   Still depressed without noticeable change from her perspective.  Still no energy. Sleep still with EFA.  Same pattern noted before. RLS unusual at this time.  Still taking Ativan 4 mg daily. Uncertain if lamotrigine or sertraline No hallucinations lately. Does not want to increase the Latuda. Due to polypharmacy, wean lamotrigine  10/22/2021 appointment with the following noted: Increased cycling with DC lamotrigine. Angry a lot. Wants to try Caplyta and it's affordable. I don't like the Latuda bc eating more and EFA. Too hungry with LatudaShe will start Caplyta 42 mg daily.  If she has side effects that are intolerable then she  will start Vraylar 1.5 mg daily in place of Latuda and Caplyta. Plan: She will start Caplyta 42 mg daily.  If she has side effects that are intolerable then she will start Vraylar 1.5 mg daily in place of Latuda and Caplyta.  11/04/2021 phone call reporting that the Vraylar 1.5 mg samples were working and she would like to get more samples.     11/25/2021 appointment with the following noted: Not sure the problem with Caplyta. I love Vraylar.  Not as angry or depressed.  Lower appetite.  Better sleep.   Insurance covers it but $450 dollars. SE none. Disc concerns about getting.  Remote SI but did not hurt herself, was hospitalized.   Prior psychiatric medication trials include Latuda 40 restless legs,  Fanapt which cause restless legs,  Loxapine weight,  Abilfiy 10 NM  Saphris which caused weight gain & RLS, Liked saphris some. olanzapine weight gain,  Geodon,  Seroquel weight gain and restless legs,  risperidone restless legs,  Haloperidol 2 RLS,    loxapine cost and weight,  Latuda helped mood but $ first trial, retrial SE hunger 30 Sinemet, gabapentin. Mirapex, ropinirole Lyrica weight gain, lithium side effects, lamotrigine, Equetro with mildly elevated liver enzymes, Depakote nausea Lexapro, duloxetine, venlafaxine, bupropion, fluoxetine, sertraline,    doxepin ineffective, Restoril lost effect,  Ambien, mirtazapine increased appetite, trazodone restlessness,,   04/18/20 appt with neuro about RLS.    Iron studies were normal.  The neurologist suggested referral to a movement disorder specialist.  We discussed at length that there are occasions in which going too high with a dopamine agonist can actually worsen restless legs rather than improve it.  She is at a high dose of ropinirole.  She does not want to go to see another doctor about this at this time.  Given that situation it was suggested that she try reducing the evening dose of ropinirole to 6 mg from 8 mg given that her  restless legs is only a problem in the evening before she goes to bed and not after.   Review of Systems:  Review of Systems  Cardiovascular:  Negative for chest pain and palpitations.  Gastrointestinal:  Negative for nausea.  Neurological:  Negative for dizziness, tremors and weakness.       RLS  Psychiatric/Behavioral:  Negative for dysphoric mood and sleep disturbance. The patient is not nervous/anxious.    Medications: I have reviewed the patient's current medications.  Current Outpatient Medications  Medication Sig Dispense Refill   cyclobenzaprine (FLEXERIL) 10 MG tablet TAKE 2 TABLETS AT BEDTIME 180 tablet 0   divalproex (DEPAKOTE ER) 500 MG 24 hr tablet TAKE TWO TABLETS BY MOUTH EVERY NIGHT AT BEDTIME 60 tablet 0   doxazosin (CARDURA) 4 MG tablet TAKE 1 AND 1/2 TABLETS AT BEDTIME 135 tablet 0   gabapentin (NEURONTIN) 400 MG capsule TAKE 1 CAPSULE TWICE DAILY  AND TAKE 2 CAPSULES IN THE EVENING 360 capsule 0   rOPINIRole (REQUIP) 2 MG tablet TAKE 1 TABLET AT 5 PM. TAKE IN ADDITION TO 8MG  AT BEDTIME. 90 tablet 0   rOPINIRole (REQUIP) 4 MG tablet TAKE 2 TABLETS (8 MG TOTAL) AT BEDTIME. 180 tablet 0   sertraline (ZOLOFT) 100 MG tablet TAKE 1 TABLET EVERY DAY 90 tablet 0   valACYclovir (VALTREX) 500 MG tablet Take one tablet by mouth twice daily for 3-5 days then daily as needed. 90 tablet 1   zolpidem (AMBIEN) 10 MG tablet TAKE ONE TABLET BY MOUTH EVERY NIGHT AT BEDTIME AS NEEDED FOR SLEEP 30 tablet 0   cariprazine (VRAYLAR) 3 MG capsule Every other day 15 capsule 1   LORazepam (ATIVAN) 1 MG tablet Take 1 tablet (1 mg total) by mouth 4 (four) times daily. 120 tablet 1   No current facility-administered medications for this visit.    Medication Side Effects: nausea  Allergies: No Known Allergies  Past Medical History:  Diagnosis Date   Bipolar disorder ([place])    HSV (herpes simplex virus) anogenital infection 05/2018   RLS (restless legs syndrome)     Family History   Problem Relation Age of Onset   Thyroid disease Mother  COPD Mother    Bipolar disorder Mother    Parkinson's disease Father    Breast cancer Paternal Grandmother 74   Bipolar disorder Sister    Schizophrenia Other     Social History   Socioeconomic History   Marital status: Married    Spouse name: Not on file   Number of children: 0   Years of education: 16   Highest education level: Associate degree: academic program  Occupational History   Occupation: unemployed  Tobacco Use   Smoking status: Former   Smokeless tobacco: Never  Scientific laboratory technician Use: Never used  Substance and Sexual Activity   Alcohol use: No   Drug use: No   Sexual activity: Yes    Birth control/protection: None    Comment: 1st intercourse 32 yo-5 partners  Other Topics Concern   Not on file  Social History Narrative   Pt is right handed   Lives in single story home with her husband, mother and uncle   Has associated degree   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    Social Determinants of Health   Financial Resource Strain: Not on file  Food Insecurity: Not on file  Transportation Needs: Not on file  Physical Activity: Not on file  Stress: Not on file  Social Connections: Not on file  Intimate Partner Violence: Not on file    Past Medical History, Surgical history, Social history, and Family history were reviewed and updated as appropriate.   Please see review of systems for further details on the patient's review from today.   Objective:   Physical Exam:  There were no vitals taken for this visit.  Physical Exam Constitutional:      General: She is not in acute distress. Musculoskeletal:        General: No deformity.  Neurological:     Mental Status: She is alert and oriented to person, place, and time.     Coordination: Coordination normal.  Psychiatric:        Attention and Perception: Attention and perception normal. She does not perceive auditory or visual  hallucinations.        Mood and Affect: Mood is not anxious or depressed. Affect is not labile, blunt, angry, tearful or inappropriate.        Speech: Speech normal.        Behavior: Behavior normal.        Thought Content: Thought content normal. Thought content is not paranoid or delusional. Thought content does not include homicidal or suicidal ideation.        Cognition and Memory: Cognition and memory normal.        Judgment: Judgment normal.     Comments: Insight intact Patient has much less cycling so far with Vraylar    Lab Review:     Component Value Date/Time   NA 141 09/18/2021 1125   K 5.2 09/18/2021 1125   CL 105 09/18/2021 1125   CO2 27 09/18/2021 1125   GLUCOSE 87 09/18/2021 1125   BUN 13 09/18/2021 1125   CREATININE 0.67 09/18/2021 1125   CALCIUM 9.4 09/18/2021 1125   PROT 6.6 09/18/2021 1125   ALBUMIN 4.7 03/20/2017 1100   AST 14 09/18/2021 1125   ALT 8 09/18/2021 1125   ALKPHOS 65 03/20/2017 1100   BILITOT 0.3 09/18/2021 1125       Component Value Date/Time   WBC 5.7 09/18/2021 1125   RBC 3.89 09/18/2021 1125   HGB  12.5 09/18/2021 1125   HCT 37.2 09/18/2021 1125   PLT 267 09/18/2021 1125   MCV 95.6 09/18/2021 1125   MCH 32.1 09/18/2021 1125   MCHC 33.6 09/18/2021 1125   RDW 11.2 09/18/2021 1125   LYMPHSABS 1,995 09/18/2021 1125   MONOABS 570 03/20/2017 1100   EOSABS 251 09/18/2021 1125   BASOSABS 91 09/18/2021 1125    No results found for: POCLITH, LITHIUM   No results found for: PHENYTOIN, PHENOBARB, VALPROATE, CBMZ   .res Assessment: Plan:    Vi was seen today for follow-up.  Diagnoses and all orders for this visit:  Bipolar disorder, current episode depressed, severe, with psychotic features ([place])  PTSD (post-traumatic stress disorder) -     LORazepam (ATIVAN) 1 MG tablet; Take 1 tablet (1 mg total) by mouth 4 (four) times daily.  Generalized anxiety disorder -     LORazepam (ATIVAN) 1 MG tablet; Take 1 tablet (1 mg total) by  mouth 4 (four) times daily.  Insomnia due to other mental disorder  Nightmares associated with chronic post-traumatic stress disorder  RLS (restless legs syndrome)  Other orders -     cariprazine (VRAYLAR) 3 MG capsule; Every other day   Greater than 50% of face to face time with patient was spent on counseling and coordination of care. We discussed the following: Genavie has treatment resistant bipolar disorder with psychotic features versus schizoaffective disorder and other psychiatric diagnoses as well.  She has been very difficult to treat in part because she is very sensitive to antipsychotics and yet has strong history of chronic auditory hallucinations.    She is chronically anxious and depressed.  Extensive history of psychiatric meds tried discussed with the patient.    Markedly better with Vraylar 1.5 mg daily.  Depression and anxiety and sleep are all improved.  She is tolerating it well.  However there is concerns about the cost.  With her Medicare D plan it cost $400 a month and she cannot afford this.  Problem solved around this marry.  At follow-up if she still doing well we will try to have her apply for patient assistance.  In the meantime will give her samples. Continue Vraylar 1.5 mg daily or 3 mg every other day.   TR insomnia chronically.  But less awakening at present discussed Ambien amnesia and side effects. No change in sleep meds this visit RLS managed right now. RLS managed and neuro checked iron studies reportedly ok.  No 90 day of Ativan or Ambien, though she asked for it.   Lately her auditory hallucinations have stopped.  But we can still consider if needed, Clozapine option.        Option retry CBZ and follow liver enzymes as they were only mildly elevated.Disc DDI complications of CBZ and would need to check liver enzymes DT past history of mild elevations.  There are a lot of medication interactions.  This is the only option that won't worsen RLS except  clozapine.   Consider Caplyta or Vraylar.    The increase to 6 mg HS doxazosin was successful at managing nightmares. NM stopped after recurring on Abilify.   No  Caffeine after noon.  This is helped.    Zolpidem 10 mg 1 nightly.  Couldn't live without it.  Disc taking on empty stomach and how to deal with it if food on stomach.  Disc withdrawal from Ambien.  Discussed side effects including amnesia.   continue Gabapentin for off label anxiety and RLS at  previous dosage.  400 BID and 800 pm.  This has been helpful though has not resolve the problem.   The prognosis for improvement is very guarded from the use of medication.  She is very prone to EPS or restless legs from medications.    Discussed the risk of polypharmacy.  She is on multiple medications.  Disc purpose of each of the meds and consider changing some of them.  If gets more depressed call.  Discussed potential metabolic side effects associated with atypical antipsychotics, as well as potential risk for movement side effects. Advised pt to contact office if movement side effects occur.   Follow-up 2-3 mos  Lynder Parents MD, DFAPA Please see After Visit Summary for patient specific instructions.  Future Appointments  Date Time Provider [place]  12/03/2021  2:30 PM Cottle, Billey Co., MD [facility] None     No orders of the defined types were placed in this encounter.   -------------------------------

## 2021-12-03 ENCOUNTER — Ambulatory Visit: Payer: Medicare HMO | Admitting: Psychiatry

## 2021-12-09 ENCOUNTER — Other Ambulatory Visit: Payer: Self-pay | Admitting: Psychiatry

## 2021-12-09 DIAGNOSIS — F515 Nightmare disorder: Secondary | ICD-10-CM

## 2021-12-09 DIAGNOSIS — F4310 Post-traumatic stress disorder, unspecified: Secondary | ICD-10-CM

## 2021-12-09 DIAGNOSIS — G2581 Restless legs syndrome: Secondary | ICD-10-CM

## 2021-12-10 ENCOUNTER — Other Ambulatory Visit: Payer: Self-pay | Admitting: Psychiatry

## 2021-12-10 DIAGNOSIS — F99 Mental disorder, not otherwise specified: Secondary | ICD-10-CM

## 2021-12-11 DIAGNOSIS — R051 Acute cough: Secondary | ICD-10-CM | POA: Diagnosis not present

## 2021-12-11 DIAGNOSIS — Z20822 Contact with and (suspected) exposure to covid-19: Secondary | ICD-10-CM | POA: Diagnosis not present

## 2021-12-11 DIAGNOSIS — Z03818 Encounter for observation for suspected exposure to other biological agents ruled out: Secondary | ICD-10-CM | POA: Diagnosis not present

## 2021-12-11 NOTE — Telephone Encounter (Signed)
Last filled 11/28 appt on 01/29/22

## 2021-12-15 ENCOUNTER — Other Ambulatory Visit: Payer: Self-pay | Admitting: Psychiatry

## 2021-12-21 ENCOUNTER — Other Ambulatory Visit: Payer: Self-pay | Admitting: Psychiatry

## 2021-12-30 ENCOUNTER — Ambulatory Visit (INDEPENDENT_AMBULATORY_CARE_PROVIDER_SITE_OTHER): Payer: Medicare HMO | Admitting: Psychiatry

## 2021-12-30 ENCOUNTER — Encounter: Payer: Self-pay | Admitting: Psychiatry

## 2021-12-30 ENCOUNTER — Other Ambulatory Visit: Payer: Self-pay

## 2021-12-30 DIAGNOSIS — F99 Mental disorder, not otherwise specified: Secondary | ICD-10-CM

## 2021-12-30 DIAGNOSIS — F4310 Post-traumatic stress disorder, unspecified: Secondary | ICD-10-CM | POA: Diagnosis not present

## 2021-12-30 DIAGNOSIS — F5105 Insomnia due to other mental disorder: Secondary | ICD-10-CM | POA: Diagnosis not present

## 2021-12-30 DIAGNOSIS — F515 Nightmare disorder: Secondary | ICD-10-CM

## 2021-12-30 DIAGNOSIS — F315 Bipolar disorder, current episode depressed, severe, with psychotic features: Secondary | ICD-10-CM | POA: Diagnosis not present

## 2021-12-30 DIAGNOSIS — F411 Generalized anxiety disorder: Secondary | ICD-10-CM | POA: Diagnosis not present

## 2021-12-30 DIAGNOSIS — G2581 Restless legs syndrome: Secondary | ICD-10-CM | POA: Diagnosis not present

## 2021-12-30 DIAGNOSIS — F4312 Post-traumatic stress disorder, chronic: Secondary | ICD-10-CM | POA: Diagnosis not present

## 2021-12-30 NOTE — Progress Notes (Signed)
Beth [name] [id number] [birth year] 46 y.o.  Subjective:   Patient ID:  Beth [name] is a 46 y.o. (DOB 1976/03/11) female.  Chief Complaint:  Chief Complaint  Patient presents with   Follow-up    Bipolar disorder, current episode depressed, severe, with psychotic features ([place])   Depression    HPI Beth [name] presents to the office today for follow-up of the below.   Beth [name] presents to the office today for follow-up of TRD bipolar unstable which is difficult to treat due to med sensitivity and sleep problems.     seen August 25 , 2020.  Encouraged her to split the dose of Depakote ER 500 mg twice daily and get it away from bedtime to see if nausea and vomiting will improve.  She was encouraged to stop caffeine after noon because of insomnia and we switched from Ambien CR to regular Ambien 10 mg. Doing OK overall.     seen November 08, 2019.  She was still having some issues with sleep but was drinking caffeine too late and she was encouraged to stop caffeine after noon.  No other meds were changed.   seen January 18, 2020.  The following was noted: Not good.  Uncle living with them died 2023-03-08.  Had heart problems and apparent MI. Before that was relatively OK.  With new insurance can get generic Saphris for $24 .  It helped her sleep so well.  Doesn't remember how it worked for mood.  But wants to restart it for the sleep benefit and potential mood benefit.  Prior to his death mood swings were OK and anxiety manageable but it won't be that way now.  Has a lot more anxiety and depression.  Crying all the time.  Stressed over it.  She was to start Saphris 5 mg nightly and go up to 10 mg nightly if needed.[name]   April 23rd 2021 appointment, the following is noted: Took one Saphris and had bad RLS the rest of the night.  Asked to increase doxazosin bc NM of uncles death. On Sinemet  mos per neuro. Meds for RLS changed by doctor and it's better.No dizziness with doxazosin.   Reducing caffeine helped RLS. NM mostly controlled... Taking ambien 10 and doxazosin. Doxazosin helped NM.  Had NM of F every night and would interfere.  Tired of dreaming of him.  They stopped..   Saphris helped her go to sleep.  Before it couldn't get to sleep.  Now average 5 hours sleep.  No naps.  No RLS right now with ropinirole better than pramipexole. Insurance change demand she stop Saphris, Latuda, loxapine is $100/month.  She looked up what she can afford risperidone, Geodon, haloperidol, Tegretol  She can't afford Vraylar. Current stressors $,  Plan: pt wanted to increase doxazosin to 6 mg HS for residual NM.   06/06/20 appt with the following noted: Tolerating meds.   NM controlled.  RLS is still in evening and not much at night. Had good realtionship with father who died 3 years ago.  Had good relationship with uncle who died late 01/07/2020. Neurontin and Requip help most of all the meds.   Asked about weight gain with Depakote. Sleep is better with better schedule with husband's job. Overall mood is pretty stable except anniversary events.  10/02/20 appt with the following noted: Don't like Depakote bc more nausea with it.  Starts at night after it.  Takes over an hour to get to sleep.  N  gone in the morning.   No RLS in bed but sometimes in the evening.  Mood and hallucinations under control.  Anxiety sometimes and occ panic. Sleep chronic issues.    Helps with sister's kids 3 times weekly. Plan: no med changes  01/29/2021 appointment with following noted: Not going well.  Neuro Dr. [name] [facility]  dropped her as patient and wouldn't refill muscle relaxer, cyclobenzaprine for RLS.  Stress at home and life broken me down.  Wants to get on an actual mood stabilizer.  Very depressed and cycles into irritability.  Has tried to get into the office sooner. Not hearing voices but SI a lot without intent or plan. Hasn't felt this bad in years. No NM.  Nausea even off Depakote. Plan:  Retry Abilify 10 daily   02/18/2021 TC:  CO SE NM and stopped Abilify. Asked to try Latuda and OK trial lower dose 20 mg daily.  03/18/2021 appt noted: CO trouble sleeping after starting Latuda 20 PM. More depressed than she was.  Sometimes irritable but more depression.  Sometimes SI.   Primary stress $, life. Plan:  Increase Latuda to 30 mg and take in AM with food. Avoid PM to prevent RLS.  For depression  04/09/2021 phone call: Patient called stating she wanted to stop the Latuda 30 mg because she felt like it was making her hungry and eat more and trouble sleeping.  Plus she did not feel any depression benefit from that dosage of 30 mg.  05/16/2021 appointment with the following noted: Feeling better  Without SI.  Problems with EFA.  To bed 930 and then up at 4 AM.  No naps.   Caplyta is $500/90 days and Latuda $300/90 days Would like to stay asleep all night.  No initial insomnia. Ropinirole is managing the RLS.  Pleased with the dose. No SE other meds. Taking Ativan 4 mg daily. Still on Depakote.ER 1000 mg but was not on med list. Plan:  Agrees to resume Latuda to 30 mg and take in AM with food. Avoid PM to prevent RLS.  For depression  08/16/21 appt noted: Mo says seems better.  Eating more.  SE facial twitches at times, no one else has said anything about them.   Still depressed without noticeable change from her perspective.  Still no energy. Sleep still with EFA.  Same pattern noted before. RLS unusual at this time.  Still taking Ativan 4 mg daily. Uncertain if lamotrigine or sertraline No hallucinations lately. Does not want to increase the Latuda. Due to polypharmacy, wean lamotrigine  10/22/2021 appointment with the following noted: Increased cycling with DC lamotrigine. Angry a lot. Wants to try Caplyta and it's affordable. I don't like the Latuda bc eating more and EFA. Too hungry with LatudaShe will start Caplyta 42 mg daily.  If she has side effects that are intolerable  then she will start Vraylar 1.5 mg daily in place of Latuda and Caplyta. Plan: She will start Caplyta 42 mg daily.  If she has side effects that are intolerable then she will start Vraylar 1.5 mg daily in place of Latuda and Caplyta.  11/04/2021 phone call reporting that the Vraylar 1.5 mg samples were working and she would like to get more samples.    11/25/2021 appointment with the following noted: Not sure the problem with Caplyta. I love Vraylar.  Not as angry or depressed.  Lower appetite.  Better sleep. Taking Vraylar 1.5 mg every third day using samples. Insurance covers it but $  450 dollars. SE none. Disc concerns about getting Vraylar due to the cost. Doxazosin 6 mg nightly is working well for the nightmares.. Plan: No med changes  12/30/2021 patient requesting urgent appointment: That Beth [name] is not working. Having SI she blames on Vraylar about 10 days ago.  Not  like I would do it and occur under stress esp with H. More depressed and crying.  Lately taking Vraylar 3 mg every other day.   Weight gain 10# over the last couple of mos which is depressing.   Stress home life.  Wants counselor Sleep OK and NM managed. Liked Latuda but caused RLS managed with current meds.  Remote SI but did not hurt herself, was hospitalized.   Prior psychiatric medication trials include Latuda 40 restless legs,  Fanapt which cause restless legs,  Loxapine weight,  Abilfiy 10 NM  Saphris which caused weight gain & RLS, Liked saphris some. olanzapine weight gain,  Geodon,  Seroquel weight gain and restless legs,  risperidone restless legs,  Haloperidol 2 RLS,    loxapine cost and weight,  Latuda helped mood but $ first trial, retrial SE hunger 30 Caplyta Sinemet, gabapentin. Mirapex, ropinirole Lyrica weight gain,  lithium side effects wt gain,  lamotrigine, Equetro with mildly elevated liver enzymes, Depakote nausea Lexapro, duloxetine, venlafaxine, bupropion, fluoxetine, sertraline,     doxepin ineffective, Restoril lost effect,  Ambien, mirtazapine increased appetite, trazodone restlessness,,   04/18/20 appt with neuro about RLS.    Iron studies were normal.  The neurologist suggested referral to a movement disorder specialist.  We discussed at length that there are occasions in which going too high with a dopamine agonist can actually worsen restless legs rather than improve it.  She is at a high dose of ropinirole.  She does not want to go to see another doctor about this at this time.  Given that situation it was suggested that she try reducing the evening dose of ropinirole to 6 mg from 8 mg given that her restless legs is only a problem in the evening before she goes to bed and not after.   Review of Systems:  Review of Systems  Cardiovascular:  Negative for chest pain and palpitations.  Neurological:  Negative for dizziness, tremors and weakness.       RLS  Psychiatric/Behavioral:  Negative for dysphoric mood and sleep disturbance. The patient is not nervous/anxious.    Medications: I have reviewed the patient's current medications.  Current Outpatient Medications  Medication Sig Dispense Refill   cariprazine (VRAYLAR) 3 MG capsule Every other day 15 capsule 1   cyclobenzaprine (FLEXERIL) 10 MG tablet TAKE 2 TABLETS AT BEDTIME 180 tablet 0   divalproex (DEPAKOTE ER) 500 MG 24 hr tablet TAKE TWO TABLETS BY MOUTH EVERY NIGHT AT BEDTIME 60 tablet 0   doxazosin (CARDURA) 4 MG tablet TAKE 1 AND 1/2 TABLETS AT BEDTIME 135 tablet 0   gabapentin (NEURONTIN) 400 MG capsule TAKE 1 CAPSULE TWICE DAILY  AND TAKE 2 CAPSULES IN THE EVENING 360 capsule 0   LORazepam (ATIVAN) 1 MG tablet Take 1 tablet (1 mg total) by mouth 4 (four) times daily. 120 tablet 1   rOPINIRole (REQUIP) 2 MG tablet TAKE 1 TABLET AT 5 PM. TAKE IN ADDITION TO 8MG  AT BEDTIME. 90 tablet 0   rOPINIRole (REQUIP) 4 MG tablet TAKE 2 TABLETS AT BEDTIME 180 tablet 0   sertraline (ZOLOFT) 100 MG tablet TAKE 1 TABLET  EVERY DAY 90 tablet 0   valACYclovir (VALTREX)  500 MG tablet Take one tablet by mouth twice daily for 3-5 days then daily as needed. 90 tablet 1   zolpidem (AMBIEN) 10 MG tablet TAKE ONE TABLET BY MOUTH EVERY NIGHT AT BEDTIME AS NEEDED FOR SLEEP 30 tablet 1   No current facility-administered medications for this visit.    Medication Side Effects: nausea  Allergies: No Known Allergies  Past Medical History:  Diagnosis Date   Bipolar disorder ([place])    HSV (herpes simplex virus) anogenital infection 05/2018   RLS (restless legs syndrome)     Family History  Problem Relation Age of Onset   Thyroid disease Mother    COPD Mother    Bipolar disorder Mother    Parkinson's disease Father    Breast cancer Paternal Grandmother 78   Bipolar disorder Sister    Schizophrenia Other     Social History   Socioeconomic History   Marital status: Married    Spouse name: Not on file   Number of children: 0   Years of education: 16   Highest education level: Associate degree: academic program  Occupational History   Occupation: unemployed  Tobacco Use   Smoking status: Former   Smokeless tobacco: Never  Scientific laboratory technician Use: Never used  Substance and Sexual Activity   Alcohol use: No   Drug use: No   Sexual activity: Yes    Birth control/protection: None    Comment: 1st intercourse 47 yo-5 partners  Other Topics Concern   Not on file  Social History Narrative   Pt is right handed   Lives in single story home with her husband, mother and uncle   Has associated degree   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    Social Determinants of Health   Financial Resource Strain: Not on file  Food Insecurity: Not on file  Transportation Needs: Not on file  Physical Activity: Not on file  Stress: Not on file  Social Connections: Not on file  Intimate Partner Violence: Not on file    Past Medical History, Surgical history, Social history, and Family history were reviewed  and updated as appropriate.   Please see review of systems for further details on the patient's review from today.   Objective:   Physical Exam:  There were no vitals taken for this visit.  Physical Exam Constitutional:      General: She is not in acute distress. Musculoskeletal:        General: No deformity.  Neurological:     Mental Status: She is alert and oriented to person, place, and time.     Coordination: Coordination normal.  Psychiatric:        Attention and Perception: Attention and perception normal. She does not perceive auditory or visual hallucinations.        Mood and Affect: Mood is depressed. Mood is not anxious. Affect is not labile, blunt, angry, tearful or inappropriate.        Speech: Speech normal.        Behavior: Behavior normal.        Thought Content: Thought content is not paranoid or delusional. Thought content includes suicidal ideation. Thought content does not include homicidal ideation. Thought content does not include suicidal plan.        Cognition and Memory: Cognition and memory normal.        Judgment: Judgment normal.     Comments: Insight intact    Lab Review:  Component Value Date/Time   NA 141 09/18/2021 1125   K 5.2 09/18/2021 1125   CL 105 09/18/2021 1125   CO2 27 09/18/2021 1125   GLUCOSE 87 09/18/2021 1125   BUN 13 09/18/2021 1125   CREATININE 0.67 09/18/2021 1125   CALCIUM 9.4 09/18/2021 1125   PROT 6.6 09/18/2021 1125   ALBUMIN 4.7 03/20/2017 1100   AST 14 09/18/2021 1125   ALT 8 09/18/2021 1125   ALKPHOS 65 03/20/2017 1100   BILITOT 0.3 09/18/2021 1125       Component Value Date/Time   WBC 5.7 09/18/2021 1125   RBC 3.89 09/18/2021 1125   HGB 12.5 09/18/2021 1125   HCT 37.2 09/18/2021 1125   PLT 267 09/18/2021 1125   MCV 95.6 09/18/2021 1125   MCH 32.1 09/18/2021 1125   MCHC 33.6 09/18/2021 1125   RDW 11.2 09/18/2021 1125   LYMPHSABS 1,995 09/18/2021 1125   MONOABS 570 03/20/2017 1100   EOSABS 251  09/18/2021 1125   BASOSABS 91 09/18/2021 1125    No results found for: POCLITH, LITHIUM   No results found for: PHENYTOIN, PHENOBARB, VALPROATE, CBMZ   .res Assessment: Plan:    Beth [name] was seen today for follow-up and depression.  Diagnoses and all orders for this visit:  Bipolar disorder, current episode depressed, severe, with psychotic features ([place])  PTSD (post-traumatic stress disorder)  Generalized anxiety disorder  Insomnia due to other mental disorder  Nightmares associated with chronic post-traumatic stress disorder  RLS (restless legs syndrome)     Greater than 50% of face to face time with patient was spent on counseling and coordination of care. We discussed the following: Nnenna has treatment resistant bipolar disorder with psychotic features versus schizoaffective disorder and other psychiatric diagnoses as well.  She has been very difficult to treat in part because she is very sensitive to antipsychotics and yet has strong history of chronic auditory hallucinations.    She is chronically anxious and depressed.  Extensive history of psychiatric meds tried discussed with the patient.    Markedly better with Vraylar 1.5 mg daily at the last visit but is now complaining of increased suicidal thoughts over the last 10 days.  He is also having stress related to her marriage and wants to start counseling.  She wonders if the Beth [name] is causing the suicidal thoughts.  In view of the fact that depression is worse it is more suggestive that the Beth [name] is not working adequately rather than the cause of the problem.  Therefore makes more sense to increase the Vraylar to 3 mg daily even that she is not having any other notable side effect except for possible weight gain which she has had with nearly every medicine she is taking.  We discussed that Anette Guarneri was going generic February 28.  She wants to return to [place] at that time.  It did cause restless legs but she was not on as much  ropinirole as she is on at this time.  Discussed the very high dose of recurrent ropinirole and possible psychiatric side effects with that.   TR insomnia chronically.  But less awakening at present discussed Ambien amnesia and side effects. No change in sleep meds this visit RLS managed right now. RLS managed and neuro checked iron studies reportedly ok.  No 90 day of Ativan or Ambien, though she asked for it.   Lately her auditory hallucinations have stopped.  But we can still consider if needed, Clozapine option.  Option retry CBZ and follow liver enzymes as they were only mildly elevated.Disc DDI complications of CBZ and would need to check liver enzymes DT past history of mild elevations.  There are a lot of medication interactions.  This is the only option that won't worsen RLS except clozapine.   Consider Caplyta or Vraylar.    The increase to 6 mg HS doxazosin was successful at managing nightmares. NM stopped after recurring on Abilify.   No  Caffeine after noon.  This is helped.    Zolpidem 10 mg 1 nightly.  Couldn't live without it.  Disc taking on empty stomach and how to deal with it if food on stomach.  Disc withdrawal from Ambien.  Discussed side effects including amnesia.   continue Gabapentin for off label anxiety and RLS at previous dosage.  400 BID and 800 pm.  This has been helpful though has not resolve the problem.   The prognosis for improvement is very guarded from the use of medication.  She is very prone to EPS or restless legs from medications.    Discussed the risk of polypharmacy.  She is on multiple medications.  Disc purpose of each of the meds and consider changing some of them. Plan: increase Vraylar 3  mg every day for depression and SI Continue cyclobenzaprine 20 mg nightly Continue Depakote 1000 mg nightly Continue doxazosin 6 mg nightly Continue gabapentin 400 mg twice daily and 800 mg nightly Continue lorazepam 1 mg 3 times daily 4 times  daily Continue ropinirole 2 mg at 5 PM and 8 mg nightly Continue sertraline 100 mg daily Continue Ambien 10 mg nightly  Need counseling and referral made to [place] . Discussed safety plan at length with patient.  Advised patient to contact office with any worsening signs and symptoms.  Instructed patient to go to the [facility] emergency room for evaluation if experiencing any acute safety concerns, to include suicidal intent.  Discussed potential metabolic side effects associated with atypical antipsychotics, as well as potential risk for movement side effects. Advised pt to contact office if movement side effects occur.   She agrees with the plan  Follow-up 3 weeks and a month later  Lynder Parents MD, DFAPA Please see After Visit Summary for patient specific instructions.  Future Appointments  Date Time Provider [place]  01/22/2022 11:30 AM Cottle, Billey Co., MD [facility] None  02/20/2022  9:00 AM Cottle, Billey Co., MD [facility] None     No orders of the defined types were placed in this encounter.    -------------------------------

## 2021-12-30 NOTE — Patient Instructions (Addendum)
Beth [name] [facility] [name] LCSW [facility] [place]  Increase Vraylar to 3 mg daily

## 2022-01-20 ENCOUNTER — Other Ambulatory Visit: Payer: Self-pay | Admitting: Psychiatry

## 2022-01-22 ENCOUNTER — Ambulatory Visit: Payer: Medicare HMO | Admitting: Psychiatry

## 2022-01-22 ENCOUNTER — Ambulatory Visit (INDEPENDENT_AMBULATORY_CARE_PROVIDER_SITE_OTHER): Payer: Medicare HMO | Admitting: Clinical

## 2022-01-22 ENCOUNTER — Other Ambulatory Visit: Payer: Self-pay

## 2022-01-22 ENCOUNTER — Encounter: Payer: Self-pay | Admitting: Psychiatry

## 2022-01-22 DIAGNOSIS — F5105 Insomnia due to other mental disorder: Secondary | ICD-10-CM | POA: Diagnosis not present

## 2022-01-22 DIAGNOSIS — G2581 Restless legs syndrome: Secondary | ICD-10-CM | POA: Diagnosis not present

## 2022-01-22 DIAGNOSIS — F315 Bipolar disorder, current episode depressed, severe, with psychotic features: Secondary | ICD-10-CM | POA: Diagnosis not present

## 2022-01-22 DIAGNOSIS — F411 Generalized anxiety disorder: Secondary | ICD-10-CM

## 2022-01-22 DIAGNOSIS — F4312 Post-traumatic stress disorder, chronic: Secondary | ICD-10-CM

## 2022-01-22 DIAGNOSIS — F4310 Post-traumatic stress disorder, unspecified: Secondary | ICD-10-CM

## 2022-01-22 DIAGNOSIS — F99 Mental disorder, not otherwise specified: Secondary | ICD-10-CM

## 2022-01-22 DIAGNOSIS — F39 Unspecified mood [affective] disorder: Secondary | ICD-10-CM | POA: Diagnosis not present

## 2022-01-22 DIAGNOSIS — F5101 Primary insomnia: Secondary | ICD-10-CM

## 2022-01-22 DIAGNOSIS — F515 Nightmare disorder: Secondary | ICD-10-CM

## 2022-01-22 NOTE — Plan of Care (Signed)
Pt develop healthy coping strategies for mood management as evidenced by exercising 3/7 days week for up to 30 minutes; gardening/yardwork 3/7 days week up to 30 mins. Pt participated in completion of treatment plan.
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
## 2022-01-29 ENCOUNTER — Ambulatory Visit: Payer: Medicare HMO | Admitting: Psychiatry

## 2022-02-04 ENCOUNTER — Other Ambulatory Visit: Payer: Self-pay

## 2022-02-04 ENCOUNTER — Ambulatory Visit (INDEPENDENT_AMBULATORY_CARE_PROVIDER_SITE_OTHER): Payer: Medicare HMO | Admitting: Clinical

## 2022-02-04 DIAGNOSIS — F39 Unspecified mood [affective] disorder: Secondary | ICD-10-CM

## 2022-02-04 DIAGNOSIS — Z87828 Personal history of other (healed) physical injury and trauma: Secondary | ICD-10-CM

## 2022-02-04 DIAGNOSIS — F411 Generalized anxiety disorder: Secondary | ICD-10-CM
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
## 2022-02-05 ENCOUNTER — Other Ambulatory Visit: Payer: Self-pay | Admitting: Psychiatry

## 2022-02-07 ENCOUNTER — Other Ambulatory Visit: Payer: Self-pay | Admitting: Psychiatry

## 2022-02-07 DIAGNOSIS — F5105 Insomnia due to other mental disorder: Secondary | ICD-10-CM

## 2022-02-13 ENCOUNTER — Telehealth: Payer: Self-pay | Admitting: Psychiatry

## 2022-02-13 ENCOUNTER — Other Ambulatory Visit: Payer: Self-pay | Admitting: Psychiatry

## 2022-02-13 DIAGNOSIS — F39 Unspecified mood [affective] disorder: Secondary | ICD-10-CM

## 2022-02-13 MED ORDER — LURASIDONE HCL 40 MG PO TABS: 40.0000 mg | ORAL_TABLET | Freq: Every day | ORAL | 1 refills | Status: DC

## 2022-02-13 NOTE — Telephone Encounter (Signed)
Next visit is 02/20/22. Beth [name] is requesting a refill on her Latuda (Generic) called in to: ? ?[place], [place] ? ?Phone:  [phone]  ?Fax:  [phone]  ? ? ? ? ? ?

## 2022-02-13 NOTE — Telephone Encounter (Signed)
The note from 2/8 stated "Will continue Vraylar 3  mg every day for depression and SI until the end of the month and then switch to Latuda 40 mg daily which helped" ?Ok to send latuda?

## 2022-02-13 NOTE — Telephone Encounter (Signed)
Beth [name] should be generic as of now.  Sent prescription for Latuda 40 mg.  If she can get it tell her to stop the Vraylar.  If she cannot get it let us know. ?

## 2022-02-18 ENCOUNTER — Telehealth: Payer: Self-pay

## 2022-02-18 NOTE — Telephone Encounter (Signed)
Prior authorization submitted and approved for LURASIDONE 40 MG effective 12/15/2021-12/14/2022 PA# [id number].  ?[employer] [id number] ?

## 2022-02-20 ENCOUNTER — Encounter: Payer: Self-pay | Admitting: Psychiatry

## 2022-02-20 ENCOUNTER — Ambulatory Visit: Payer: Medicare HMO | Admitting: Psychiatry

## 2022-02-20 ENCOUNTER — Other Ambulatory Visit: Payer: Self-pay | Admitting: Psychiatry

## 2022-02-20 ENCOUNTER — Other Ambulatory Visit: Payer: Self-pay

## 2022-02-20 DIAGNOSIS — F5105 Insomnia due to other mental disorder: Secondary | ICD-10-CM

## 2022-02-20 DIAGNOSIS — F4310 Post-traumatic stress disorder, unspecified: Secondary | ICD-10-CM | POA: Diagnosis not present

## 2022-02-20 DIAGNOSIS — G2581 Restless legs syndrome: Secondary | ICD-10-CM

## 2022-02-20 DIAGNOSIS — F315 Bipolar disorder, current episode depressed, severe, with psychotic features: Secondary | ICD-10-CM

## 2022-02-20 DIAGNOSIS — F515 Nightmare disorder: Secondary | ICD-10-CM | POA: Diagnosis not present

## 2022-02-20 DIAGNOSIS — F99 Mental disorder, not otherwise specified: Secondary | ICD-10-CM

## 2022-02-20 DIAGNOSIS — F411 Generalized anxiety disorder: Secondary | ICD-10-CM

## 2022-02-20 DIAGNOSIS — F4312 Post-traumatic stress disorder, chronic: Secondary | ICD-10-CM

## 2022-02-20 MED ORDER — GABAPENTIN 400 MG PO CAPS: ORAL_CAPSULE | ORAL | 0 refills | Status: DC

## 2022-02-20 MED ORDER — LURASIDONE HCL 40 MG PO TABS: 40.0000 mg | ORAL_TABLET | Freq: Every day | ORAL | 0 refills | Status: DC

## 2022-02-20 MED ORDER — LORAZEPAM 1 MG PO TABS: 1.0000 mg | ORAL_TABLET | Freq: Four times a day (QID) | ORAL | 1 refills | Status: DC
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
## 2022-02-27 ENCOUNTER — Ambulatory Visit (HOSPITAL_COMMUNITY): Payer: Medicare HMO | Admitting: Clinical

## 2022-02-27 ENCOUNTER — Encounter (HOSPITAL_COMMUNITY): Payer: Self-pay

## 2022-03-05 ENCOUNTER — Ambulatory Visit (HOSPITAL_COMMUNITY): Payer: Medicare HMO | Admitting: Clinical

## 2022-03-12 ENCOUNTER — Other Ambulatory Visit: Payer: Self-pay | Admitting: Psychiatry

## 2022-03-12 DIAGNOSIS — F5105 Insomnia due to other mental disorder: Secondary | ICD-10-CM

## 2022-03-26 ENCOUNTER — Telehealth: Payer: Self-pay | Admitting: Psychiatry

## 2022-03-26 NOTE — Telephone Encounter (Signed)
Patient thinks she needs to increase Latuda. She reports she is sleeping ok and does not have RLS. She feels that she is cycling - is depressed, has lost weight because she doesn't want to eat. She said she is able to function but she gets angry, yells, and wants to break things but isn't doing that - takes Ativan for this which is helpful. She has an appt in 2 weeks. Has no other complaints.  ?

## 2022-03-26 NOTE — Telephone Encounter (Signed)
Pt LVM  having a lot of depression. Bipolar cycling. Has apt 4/24, on canc list. Asking if she can increase Latuda and questions on starting Lamictal again. Need change before apt. Contact # [phone]   ?

## 2022-03-27 NOTE — Telephone Encounter (Signed)
Increasing the Latuda is likely to help the most rapidly and to be more effective for the irritability as well.  My understanding is that she is taking 40 mg of Latuda currently.  Reminded her she has to take it with at least 350 cal otherwise is not absorbed well.  She can increase the dose to 60 mg daily.  She could see some benefit within the week. ? ?I have no objection to starting Lamictal again, but because of the rash risk associated with rapid dose increases, it takes at least a month to get the dosage high enough to do any good and is not as good for irritability as Latuda.  Both are effective for depression.  So let us see how she does with increasing the Latuda and if it does not adequately manage her depression then we can start Lamictal in a week or 2. ?

## 2022-03-27 NOTE — Telephone Encounter (Signed)
Patient notified. She has an appt 4/24 and will F/U with Dr. Clovis Pu then on need for Lamictal.  ?

## 2022-03-28 DIAGNOSIS — Z03818 Encounter for observation for suspected exposure to other biological agents ruled out: Secondary | ICD-10-CM | POA: Diagnosis not present

## 2022-03-28 DIAGNOSIS — Z20822 Contact with and (suspected) exposure to covid-19: Secondary | ICD-10-CM | POA: Diagnosis not present

## 2022-03-28 DIAGNOSIS — R051 Acute cough: Secondary | ICD-10-CM | POA: Diagnosis not present

## 2022-03-28 DIAGNOSIS — J0140 Acute pansinusitis, unspecified: Secondary | ICD-10-CM | POA: Diagnosis not present

## 2022-03-30 ENCOUNTER — Other Ambulatory Visit: Payer: Self-pay

## 2022-03-30 DIAGNOSIS — F4310 Post-traumatic stress disorder, unspecified: Secondary | ICD-10-CM

## 2022-03-30 DIAGNOSIS — F411 Generalized anxiety disorder: Secondary | ICD-10-CM

## 2022-03-30 DIAGNOSIS — F315 Bipolar disorder, current episode depressed, severe, with psychotic features: Secondary | ICD-10-CM

## 2022-03-30 MED ORDER — SERTRALINE HCL 100 MG PO TABS: 100.0000 mg | ORAL_TABLET | Freq: Every day | ORAL | 0 refills | Status: DC

## 2022-03-31 DIAGNOSIS — J189 Pneumonia, unspecified organism: Secondary | ICD-10-CM | POA: Diagnosis not present

## 2022-04-02 ENCOUNTER — Ambulatory Visit (HOSPITAL_COMMUNITY): Payer: Self-pay

## 2022-04-03 ENCOUNTER — Ambulatory Visit (INDEPENDENT_AMBULATORY_CARE_PROVIDER_SITE_OTHER): Payer: Medicare HMO

## 2022-04-03 ENCOUNTER — Ambulatory Visit (HOSPITAL_COMMUNITY)
Admission: RE | Admit: 2022-04-03 | Discharge: 2022-04-03 | Disposition: A | Discharge status: Home / Self Care | Payer: Medicare HMO | Source: Ambulatory Visit

## 2022-04-03 VITALS — BP 109/66 | HR 93 | Temp 98.4°F | Resp 18

## 2022-04-03 DIAGNOSIS — J209 Acute bronchitis, unspecified: Secondary | ICD-10-CM

## 2022-04-03 DIAGNOSIS — R059 Cough, unspecified: Secondary | ICD-10-CM | POA: Diagnosis not present

## 2022-04-03 MED ORDER — PROMETHAZINE-DM 6.25-15 MG/5ML PO SYRP: 5.0000 mL | ORAL_SOLUTION | Freq: Four times a day (QID) | ORAL | 0 refills | Status: DC | PRN

## 2022-04-03 MED ORDER — PREDNISONE 20 MG PO TABS: 40.0000 mg | ORAL_TABLET | Freq: Every day | ORAL | 0 refills | Status: AC

## 2022-04-03 MED ORDER — ALBUTEROL SULFATE HFA 108 (90 BASE) MCG/ACT IN AERS
1.0000 | INHALATION_SPRAY | Freq: Four times a day (QID) | RESPIRATORY_TRACT | 0 refills | Status: DC | PRN
Start: 2022-04-03 — End: 2022-08-05

## 2022-04-03 NOTE — ED Triage Notes (Signed)
Pt states she went to the [facility] and Dr dx her with Pneumonia. Cough hasn't gotten better. Pt states she wants medicine for her cough. ?

## 2022-04-03 NOTE — ED Provider Notes (Signed)
?[place] ? ? ? ?CSN: [id number] ?Arrival date & time: 04/03/22  6283 ? ? ?  ? ?History   ?Chief Complaint ?Chief Complaint  ?Patient presents with  ? Cough  ?  Pneumonia - Entered by patient8:30   ? 8:30 apptt  ? ? ?HPI ?Beth [name] is a 46 y.o. female.  ? ?Patient presents to urgent care for evaluation of her cough. She went to the [facility] 6 days ago and was diagnosed with pneumonia. Prescribed antibiotics (she thinks doxycycline) and tessalon pearles at that time. She went back 4 days ago on Monday April 17th where she was prescribed a steroid dose pack taper.  She is currently taking doxycycline and prednisone and is on day 3 of the prednisone Dosepak.  States the the cough has gotten worse over the last 4 days. Has tried delsum cough syrup at home with no relief. Cough is productive, but patient "cannot get phlegm out". Reports minimal nasal congestion with clear mucous. Denies fever, shortness of breath, chest pain, nausea, diarrhea, constipation, and dizziness.  Denies drinking a lot of water, does report that she has been taking Mucinex with minimal relief.  Denies any other aggravating or relieving symptoms.  She is not a tobacco smoker and does not have any significant chronic respiratory illnesses. ? ? ?Cough ? ?Past Medical History:  ?Diagnosis Date  ? Bipolar disorder ([place])   ? HSV (herpes simplex virus) anogenital infection 05/2018  ? RLS (restless legs syndrome)   ? ? ?Patient Active Problem List  ? Diagnosis Date Noted  ? Bipolar disorder, unspecified ([place]) 10/11/2018  ? Generalized anxiety disorder 10/11/2018  ? Insomnia 10/11/2018  ? ? ?Past Surgical History:  ?Procedure Laterality Date  ? BARTHOLIN CYST MARSUPIALIZATION    ? IVF    ? ? ?OB History   ? ? Gravida  ?0  ? Para  ?0  ? Term  ?0  ? Preterm  ?0  ? AB  ?0  ? Living  ?0  ?  ? ? SAB  ?0  ? IAB  ?0  ? Ectopic  ?0  ? Multiple  ?0  ? Live Births  ?0  ?   ?  ?  ? ? ? ?Home Medications   ? ?Prior to Admission  medications   ?Medication Sig Start Date End Date Taking? Authorizing Provider  ?albuterol (VENTOLIN HFA) 108 (90 Base) MCG/ACT inhaler Inhale 1-2 puffs into the lungs every 6 (six) hours as needed for wheezing or shortness of breath. 04/03/22  Yes Talbot Grumbling, FNP  ?doxycycline (VIBRA-TABS) 100 MG tablet Take 100 mg by mouth daily.   Yes [provider]  ?methylPREDNISolone (MEDROL) 4 MG tablet Take 4 mg by mouth daily.   Yes [provider]  ?predniSONE (DELTASONE) 20 MG tablet Take 2 tablets (40 mg total) by mouth daily for 2 days. 04/03/22 04/05/22 Yes Talbot Grumbling, FNP  ?promethazine-dextromethorphan (PROMETHAZINE-DM) 6.25-15 MG/5ML syrup Take 5 mLs by mouth 4 (four) times daily as needed for cough. 04/03/22  Yes Talbot Grumbling, FNP  ?cyclobenzaprine (FLEXERIL) 10 MG tablet TAKE 2 TABLETS AT BEDTIME 12/09/21   Cottle, Billey Co., MD  ?divalproex (DEPAKOTE ER) 500 MG 24 hr tablet TAKE TWO TABLETS BY MOUTH EVERY NIGHT AT BEDTIME 02/20/22   Cottle, Billey Co., MD  ?doxazosin (CARDURA) 4 MG tablet TAKE 1 AND 1/2 TABLETS AT BEDTIME 12/09/21   Cottle, Billey Co., MD  ?gabapentin (NEURONTIN) 400 MG capsule  TAKE 1 CAPSULE TWICE DAILY  AND TAKE 2 CAPSULES IN THE EVENING 02/20/22   Cottle, Billey Co., MD  ?LORazepam (ATIVAN) 1 MG tablet Take 1 tablet (1 mg total) by mouth 4 (four) times daily. 02/20/22   Cottle, Billey Co., MD  ?lurasidone (LATUDA) 40 MG TABS tablet Take 1 tablet (40 mg total) by mouth daily with supper. 02/20/22   Cottle, Billey Co., MD  ?rOPINIRole (REQUIP) 2 MG tablet TAKE 1 TABLET AT 5 PM. TAKE IN ADDITION TO '8MG'$  AT BEDTIME. 12/09/21   Cottle, Billey Co., MD  ?rOPINIRole (REQUIP) 4 MG tablet TAKE 2 TABLETS AT BEDTIME 12/09/21   Cottle, Billey Co., MD  ?sertraline (ZOLOFT) 100 MG tablet Take 1 tablet (100 mg total) by mouth daily. 03/30/22   Cottle, Billey Co., MD  ?valACYclovir (VALTREX) 500 MG tablet Take one tablet by mouth twice daily for 3-5 days then daily  as needed. 09/18/21   Tamela Gammon, NP  ?zolpidem (AMBIEN) 10 MG tablet TAKE ONE TABLET BY MOUTH EVERY NIGHT AT BEDTIME AS NEEDED FOR SLEEP 03/12/22   Cottle, Billey Co., MD  ? ? ?Family History ?Family History  ?Problem Relation Age of Onset  ? Thyroid disease Mother   ? COPD Mother   ? Bipolar disorder Mother   ? Parkinson's disease Father   ? Breast cancer Paternal Grandmother 92  ? Bipolar disorder Sister   ? Schizophrenia Other   ? ? ?Social History ?Social History  ? ?Tobacco Use  ? Smoking status: Former  ? Smokeless tobacco: Never  ?Vaping Use  ? Vaping Use: Never used  ?Substance Use Topics  ? Alcohol use: No  ? Drug use: No  ? ? ? ?Allergies   ?Patient has no known allergies. ? ? ?Review of Systems ?Review of Systems  ?Respiratory:  Positive for cough.   ?Per HPI ? ?Physical Exam ?Triage Vital Signs ?ED Triage Vitals  ?Enc Vitals Group  ?   BP 04/03/22 0831 109/66  ?   Pulse Rate 04/03/22 0831 93  ?   Resp 04/03/22 0831 18  ?   Temp 04/03/22 0831 98.4 ?F (36.9 ?C)  ?   Temp Source 04/03/22 0831 Oral  ?   SpO2 04/03/22 0831 94 %  ?   Weight --   ?   Height --   ?   Head Circumference --   ?   Peak Flow --   ?   Pain Score 04/03/22 0826 0  ?   Pain Loc --   ?   Pain Edu? --   ?   Excl. in [place]? --   ? ?No data found. ? ?Updated Vital Signs ?BP 109/66 (BP Location: Right Arm)   Pulse 93   Temp 98.4 ?F (36.9 ?C) (Oral)   Resp 18   LMP 03/30/2022   SpO2 94%  ? ?Visual Acuity ?Right Eye Distance:   ?Left Eye Distance:   ?Bilateral Distance:   ? ?Right Eye Near:   ?Left Eye Near:    ?Bilateral Near:    ? ?Physical Exam ?Vitals and nursing note reviewed.  ?Constitutional:   ?   General: She is not in acute distress. ?   Appearance: Normal appearance. She is well-developed. She is not ill-appearing.  ?HENT:  ?   Head: Normocephalic and atraumatic.  ?   Right Ear: Tympanic membrane, ear canal and external ear normal.  ?   Left Ear: Tympanic membrane, ear canal and  external ear normal.  ?   Nose: Nose normal.   ?   Mouth/Throat:  ?   Mouth: Mucous membranes are moist.  ?Eyes:  ?   General: Lids are normal. Vision grossly intact.  ?   Extraocular Movements: Extraocular movements intact.  ?   Conjunctiva/sclera: Conjunctivae normal.  ?Cardiovascular:  ?   Rate and Rhythm: Normal rate and regular rhythm.  ?   Pulses: Normal pulses.  ?   Heart sounds: Normal heart sounds, S1 normal and S2 normal. No murmur heard. ?  No friction rub. No gallop.  ?Pulmonary:  ?   Effort: Pulmonary effort is normal. No prolonged expiration or respiratory distress.  ?   Breath sounds: Normal air entry. Wheezing and rhonchi present. No rales.  ?   Comments: Wheezing and rhonchi noted to patient's respiratory exam.  Patient is not in any respiratory distress at this time and appears comfortably breathing on exam table.  No cough with deep inspiration during lung exam. ?Chest:  ?   Chest wall: No tenderness.  ?Abdominal:  ?   Palpations: Abdomen is soft.  ?   Tenderness: There is no abdominal tenderness. There is no right CVA tenderness or left CVA tenderness.  ?Musculoskeletal:     ?   General: No swelling.  ?   Cervical back: Neck supple.  ?Skin: ?   General: Skin is warm and dry.  ?   Capillary Refill: Capillary refill takes less than 2 seconds.  ?   Findings: No rash.  ?   Comments: No tenting and no signs of dehydration physical exam.  ?Neurological:  ?   General: No focal deficit present.  ?   Mental Status: She is alert and oriented to person, place, and time.  ?   Sensory: Sensation is intact.  ?   Motor: Motor function is intact.  ?   Coordination: Coordination is intact.  ?Psychiatric:     ?   Mood and Affect: Mood normal.     ?   Behavior: Behavior normal.     ?   Thought Content: Thought content normal.     ?   Judgment: Judgment normal.  ? ? ? ?UC Treatments / Results  ?Labs ?(all labs ordered are listed, but only abnormal results are displayed) ?Labs Reviewed - No data to display ? ?EKG ? ? ?Radiology ?DG Chest 2 View ? ?Result Date:  04/03/2022 ?CLINICAL DATA:  Cough. EXAM: CHEST - 2 VIEW COMPARISON:  No pertinent prior exams available for comparison. FINDINGS: Heart size within normal limits. No appreciable airspace consolidation. No evidence o

## 2022-04-03 NOTE — Discharge Instructions (Signed)
Your chest x-ray today was negative for pneumonia.  I believe that your symptoms are caused by bronchitis which is inflammation of the airways. ? ?I have prescribed cough medicine for you to take at night.  Please do not drink alcohol or drive while taking this medication. ? ?Please take your home supply of Mucinex and drink plenty of water with this as we discussed today in the clinic.  Purchase a 32 ounce water bottle, fill it up in the morning and drink all of it by lunchtime.  Then fill it up again at lunch, and drink all of it by dinnertime.  Your body will feel so much better and you will be able to clear your mucus a lot easier with the Mucinex. ? ?No further antibiotics are needed at this time.  However, I would like for you to stop taking your prednisone Dosepak and take 2 days worth of 40 mg of prednisone.  I have sent this to your pharmacy.  Please take this with food and avoid taking NSAID medications while you are on this drug.  NSAID medications include ibuprofen, naproxen, Aleve, Goody powder, and aspirin.  You may take Tylenol for any aches and pains that you may have including headache. ? ?I hope you feel better! ?

## 2022-04-07 ENCOUNTER — Encounter: Payer: Self-pay | Admitting: Psychiatry

## 2022-04-07 ENCOUNTER — Ambulatory Visit (INDEPENDENT_AMBULATORY_CARE_PROVIDER_SITE_OTHER): Payer: Medicare HMO | Admitting: Psychiatry

## 2022-04-07 DIAGNOSIS — G2581 Restless legs syndrome: Secondary | ICD-10-CM

## 2022-04-07 DIAGNOSIS — F515 Nightmare disorder: Secondary | ICD-10-CM

## 2022-04-07 DIAGNOSIS — F99 Mental disorder, not otherwise specified: Secondary | ICD-10-CM

## 2022-04-07 DIAGNOSIS — F411 Generalized anxiety disorder: Secondary | ICD-10-CM

## 2022-04-07 DIAGNOSIS — F4310 Post-traumatic stress disorder, unspecified: Secondary | ICD-10-CM | POA: Diagnosis not present

## 2022-04-07 DIAGNOSIS — F5105 Insomnia due to other mental disorder: Secondary | ICD-10-CM | POA: Diagnosis not present

## 2022-04-07 DIAGNOSIS — F4312 Post-traumatic stress disorder, chronic: Secondary | ICD-10-CM | POA: Diagnosis not present

## 2022-04-07 DIAGNOSIS — F315 Bipolar disorder, current episode depressed, severe, with psychotic features: Secondary | ICD-10-CM | POA: Diagnosis not present

## 2022-04-07 MED ORDER — LURASIDONE HCL 60 MG PO TABS: 60.0000 mg | ORAL_TABLET | Freq: Every day | ORAL | 0 refills | Status: DC
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
## 2022-04-09 ENCOUNTER — Other Ambulatory Visit: Payer: Self-pay | Admitting: Psychiatry

## 2022-04-09 DIAGNOSIS — F5105 Insomnia due to other mental disorder: Secondary | ICD-10-CM

## 2022-04-10 ENCOUNTER — Encounter (HOSPITAL_COMMUNITY): Payer: Self-pay

## 2022-04-10 ENCOUNTER — Ambulatory Visit (HOSPITAL_COMMUNITY)
Admission: RE | Admit: 2022-04-10 | Discharge: 2022-04-10 | Disposition: A | Discharge status: Home / Self Care | Payer: Medicare HMO | Source: Ambulatory Visit | Attending: Family Medicine | Admitting: Family Medicine

## 2022-04-10 ENCOUNTER — Ambulatory Visit (INDEPENDENT_AMBULATORY_CARE_PROVIDER_SITE_OTHER): Payer: Medicare HMO

## 2022-04-10 VITALS — BP 117/73 | HR 88 | Temp 98.1°F | Resp 19

## 2022-04-10 DIAGNOSIS — R059 Cough, unspecified: Secondary | ICD-10-CM | POA: Diagnosis not present

## 2022-04-10 DIAGNOSIS — R0602 Shortness of breath: Secondary | ICD-10-CM | POA: Diagnosis not present

## 2022-04-10 DIAGNOSIS — R052 Subacute cough: Secondary | ICD-10-CM

## 2022-04-10 DIAGNOSIS — J40 Bronchitis, not specified as acute or chronic: Secondary | ICD-10-CM

## 2022-04-10 DIAGNOSIS — R062 Wheezing: Secondary | ICD-10-CM | POA: Diagnosis not present

## 2022-04-10 MED ORDER — ALBUTEROL SULFATE (2.5 MG/3ML) 0.083% IN NEBU: 2.5000 mg | INHALATION_SOLUTION | Freq: Four times a day (QID) | RESPIRATORY_TRACT | 0 refills | Status: DC | PRN

## 2022-04-10 MED ORDER — PREDNISONE 50 MG PO TABS: 50.0000 mg | ORAL_TABLET | Freq: Every day | ORAL | 0 refills | Status: AC

## 2022-04-10 MED ORDER — ALBUTEROL SULFATE (2.5 MG/3ML) 0.083% IN NEBU
INHALATION_SOLUTION | RESPIRATORY_TRACT | Status: AC
  Filled 2022-04-10: qty 3

## 2022-04-10 MED ORDER — PROMETHAZINE-DM 6.25-15 MG/5ML PO SYRP: 5.0000 mL | ORAL_SOLUTION | Freq: Four times a day (QID) | ORAL | 0 refills | Status: DC | PRN

## 2022-04-10 MED ORDER — ALBUTEROL SULFATE (2.5 MG/3ML) 0.083% IN NEBU
2.5000 mg | INHALATION_SOLUTION | Freq: Once | RESPIRATORY_TRACT | Status: AC
  Administered 2022-04-10: 2.5 mg via RESPIRATORY_TRACT

## 2022-04-10 NOTE — ED Triage Notes (Signed)
Was seen last week and showed bronchitis on chest xray. Reports not getting any better, still having cough and SOB. Prior to be seen here was [facility] at [facility] and had [facility] ?Reports pulse ox at home only been around 90%.  ?

## 2022-04-10 NOTE — ED Provider Notes (Signed)
?[facility] ? ? ? ?CSN: [id number] ?Arrival date & time: 04/10/22  5465 ? ? ?  ? ?History   ?Chief Complaint ?Chief Complaint  ?Patient presents with  ? Cough  ?  Bronchitis and shortness of breath - Entered by patient  ? appt 830  ? ? ?HPI ?Beth [name] is a 46 y.o. female.  ? ?Patient is here for cough and sob.  ?[facility] and [facility].  Not improved so seen on 4/17 and given doxy and prednisone for possible pneumonia.  No xray done.  ?She was seen here on 4/20 for continued symptoms.  Xray negative.  Advised to stop the doxy, given prednisone pack again, inhaler, and cough syrup.  ?Patient is here for continued cough and sob.  ?She continues to feel SOB.  She states she has a cough "all the time".  Last night she was able to get the mucous up, and is now yellow in color.  She was just so SOB last night, and could not take a deep breath b/c the whole bottom of her chest feels tight.  ?She is using the rescue inhaler more and more every day.  ?She had a fever two days ago, went away with tylenol, 100.5.  ?Overall she feels worse compared to last week.  She is now able to hear more audible wheezing compared to last visit.  ?Also now with sinus congestion, and headache.  ? ?H/o tobacco use, quit 5 years ago.  No new pets at home.  No changes in home environment.  She does not work, disabled.  ?Prior to all this no lung or breathing problems/issues.  ? ?Past Medical History:  ?Diagnosis Date  ? Bipolar disorder ([facility])   ? HSV (herpes simplex virus) anogenital infection 05/2018  ? RLS (restless legs syndrome)   ? ? ?Patient Active Problem List  ? Diagnosis Date Noted  ? Bipolar disorder, unspecified ([place]) 10/11/2018  ? Generalized anxiety disorder 10/11/2018  ? Insomnia 10/11/2018  ? ? ?Past Surgical History:  ?Procedure Laterality Date  ? BARTHOLIN CYST MARSUPIALIZATION    ? IVF    ? ? ?OB History   ? ? Gravida  ?0  ? Para  ?0  ? Term  ?0  ? Preterm  ?0  ? AB  ?0  ?  Living  ?0  ?  ? ? SAB  ?0  ? IAB  ?0  ? Ectopic  ?0  ? Multiple  ?0  ? Live Births  ?0  ?   ?  ?  ? ? ? ?Home Medications   ? ?Prior to Admission medications   ?Medication Sig Start Date End Date Taking? Authorizing Provider  ?albuterol (VENTOLIN HFA) 108 (90 Base) MCG/ACT inhaler Inhale 1-2 puffs into the lungs every 6 (six) hours as needed for wheezing or shortness of breath. 04/03/22   Talbot Grumbling, FNP  ?cyclobenzaprine (FLEXERIL) 10 MG tablet TAKE 2 TABLETS AT BEDTIME 12/09/21   Cottle, Billey Co., MD  ?divalproex (DEPAKOTE ER) 500 MG 24 hr tablet TAKE TWO TABLETS BY MOUTH EVERY NIGHT AT BEDTIME 02/20/22   Cottle, Billey Co., MD  ?doxazosin (CARDURA) 4 MG tablet TAKE 1 AND 1/2 TABLETS AT BEDTIME 12/09/21   Cottle, Billey Co., MD  ?doxycycline (VIBRA-TABS) 100 MG tablet Take 100 mg by mouth daily.    [provider]  ?gabapentin (NEURONTIN) 400 MG capsule TAKE 1 CAPSULE TWICE DAILY  AND TAKE  2 CAPSULES IN THE EVENING 02/20/22   Cottle, Billey Co., MD  ?LORazepam (ATIVAN) 1 MG tablet Take 1 tablet (1 mg total) by mouth 4 (four) times daily. 02/20/22   Cottle, Billey Co., MD  ?Lurasidone HCl (LATUDA) 60 MG TABS Take 1 tablet (60 mg total) by mouth daily with supper. 04/07/22   Cottle, Billey Co., MD  ?methylPREDNISolone (MEDROL) 4 MG tablet Take 4 mg by mouth daily.    [provider]  ?promethazine-dextromethorphan (PROMETHAZINE-DM) 6.25-15 MG/5ML syrup Take 5 mLs by mouth 4 (four) times daily as needed for cough. 04/03/22   Talbot Grumbling, FNP  ?rOPINIRole (REQUIP) 2 MG tablet TAKE 1 TABLET AT 5 PM. TAKE IN ADDITION TO '8MG'$  AT BEDTIME. 12/09/21   Cottle, Billey Co., MD  ?rOPINIRole (REQUIP) 4 MG tablet TAKE 2 TABLETS AT BEDTIME 12/09/21   Cottle, Billey Co., MD  ?sertraline (ZOLOFT) 100 MG tablet Take 1 tablet (100 mg total) by mouth daily. 03/30/22   Cottle, Billey Co., MD  ?valACYclovir (VALTREX) 500 MG tablet Take one tablet by mouth twice daily for 3-5 days then daily as  needed. 09/18/21   Tamela Gammon, NP  ?zolpidem (AMBIEN) 10 MG tablet TAKE ONE TABLET BY MOUTH EVERY NIGHT AT BEDTIME AS NEEDED FOR SLEEP 04/09/22   Cottle, Billey Co., MD  ? ? ?Family History ?Family History  ?Problem Relation Age of Onset  ? Thyroid disease Mother   ? COPD Mother   ? Bipolar disorder Mother   ? Parkinson's disease Father   ? Breast cancer Paternal Grandmother 31  ? Bipolar disorder Sister   ? Schizophrenia Other   ? ? ?Social History ?Social History  ? ?Tobacco Use  ? Smoking status: Former  ? Smokeless tobacco: Never  ?Vaping Use  ? Vaping Use: Never used  ?Substance Use Topics  ? Alcohol use: No  ? Drug use: No  ? ? ? ?Allergies   ?Patient has no known allergies. ? ? ?Review of Systems ?Review of Systems  ?Constitutional:  Positive for fatigue and fever.  ?HENT:  Positive for congestion and sinus pressure. Negative for sinus pain.   ?Respiratory:  Positive for cough and wheezing.   ?Cardiovascular: Negative.   ?Gastrointestinal: Negative.   ?Genitourinary: Negative.   ? ? ?Physical Exam ?Triage Vital Signs ?ED Triage Vitals [04/10/22 0854]  ?Enc Vitals Group  ?   BP 117/73  ?   Pulse Rate 88  ?   Resp 19  ?   Temp 98.1 ?F (36.7 ?C)  ?   Temp Source Oral  ?   SpO2 91 %  ?   Weight   ?   Height   ?   Head Circumference   ?   Peak Flow   ?   Pain Score   ?   Pain Loc   ?   Pain Edu?   ?   Excl. in [place]?   ? ?No data found. ? ?Updated Vital Signs ?BP 117/73 (BP Location: Right Arm)   Pulse 88   Temp 98.1 ?F (36.7 ?C) (Oral)   Resp 19   LMP 03/30/2022   SpO2 91%  ? ?Visual Acuity ?Right Eye Distance:   ?Left Eye Distance:   ?Bilateral Distance:   ? ?Right Eye Near:   ?Left Eye Near:    ?Bilateral Near:    ? ?Physical Exam ?Constitutional:   ?   Appearance: Normal appearance.  ?HENT:  ?  Head: Normocephalic and atraumatic.  ?   Salivary Glands: Right salivary gland is tender. Left salivary gland is tender.  ?   Right Ear: Tympanic membrane normal.  ?   Left Ear: Tympanic membrane normal.  ?    Nose: Congestion present.  ?Cardiovascular:  ?   Rate and Rhythm: Regular rhythm.  ?Pulmonary:  ?   Effort: Pulmonary effort is normal.  ?   Comments: Wheezing noted anteriorly, very mild wheezing posteriorly;   ?Musculoskeletal:  ?   Cervical back: Normal range of motion and neck supple.  ?Neurological:  ?   Mental Status: She is alert.  ? ? ? ?UC Treatments / Results  ?Labs ?(all labs ordered are listed, but only abnormal results are displayed) ?Labs Reviewed - No data to display ? ?EKG ? ? ?Radiology ?DG Chest 2 View ? ?Result Date: 04/10/2022 ?CLINICAL DATA:  Cough, shortness of breath EXAM: CHEST - 2 VIEW COMPARISON:  Chest radiograph dated April 03, 2022 FINDINGS: The heart size and mediastinal contours are within normal limits. Lungs are clear without evidence of focal consolidation or pleural effusion. The visualized skeletal structures are unremarkable. IMPRESSION: No active cardiopulmonary disease. Electronically Signed   By: Keane Police D.O.   On: 04/10/2022 09:42   ? ?Procedures ?Procedures (including critical care time) ? ?Medications Ordered in UC ?Medications  ?albuterol (PROVENTIL) (2.5 MG/3ML) 0.083% nebulizer solution 2.5 mg (2.5 mg Nebulization Given 04/10/22 0918)  ? ? ?Initial Impression / Assessment and Plan / UC Course  ?I have reviewed the triage vital signs and the nursing notes. ? ?Pertinent labs & imaging results that were available during my care of the patient were reviewed by me and considered in my medical decision making (see chart for details). ? ?  ?Final Clinical Impressions(s) / UC Diagnoses  ? ?Final diagnoses:  ?Bronchitis  ?Subacute cough  ?Wheezing  ?SOB (shortness of breath)  ? ? ? ?Discharge Instructions   ? ?  ?You were seen today for continued cough and shortness of breath.  ?Your xray  was again negative for bacterial infection.  ?I have sent out a 5 day course of prednisone, as well as a cough syrup.  I have sent out albuterol nebulizer solution to use every 4-6 hrs as  needed for cough, wheezing, shortness of breath.  You may also trial over the counter zyrtec or allegra.  ?You can go to [email or web address] to look for a primary car provider.  ?Please go to the ER if you have severe

## 2022-04-10 NOTE — Discharge Instructions (Signed)
You were seen today for continued cough and shortness of breath.  ?Your xray  was again negative for bacterial infection.  ?I have sent out a 5 day course of prednisone, as well as a cough syrup.  I have sent out albuterol nebulizer solution to use every 4-6 hrs as needed for cough, wheezing, shortness of breath.  You may also trial over the counter zyrtec or allegra.  ?You can go to [email or web address] to look for a primary car provider.  ?Please go to the ER if you have severe cough, wheezing or shortness of breath that is not improving with the medications given.  ?

## 2022-04-21 ENCOUNTER — Encounter: Payer: Self-pay | Admitting: Family Medicine

## 2022-04-21 ENCOUNTER — Ambulatory Visit (INDEPENDENT_AMBULATORY_CARE_PROVIDER_SITE_OTHER): Payer: Medicare HMO | Admitting: Family Medicine

## 2022-04-21 VITALS — BP 123/76 | HR 83 | Ht 67.0 in | Wt 166.4 lb

## 2022-04-21 DIAGNOSIS — Z0000 Encounter for general adult medical examination without abnormal findings: Secondary | ICD-10-CM | POA: Diagnosis not present

## 2022-04-21 DIAGNOSIS — Z8709 Personal history of other diseases of the respiratory system: Secondary | ICD-10-CM | POA: Diagnosis not present

## 2022-04-21 DIAGNOSIS — Z1159 Encounter for screening for other viral diseases: Secondary | ICD-10-CM | POA: Diagnosis not present

## 2022-04-21 DIAGNOSIS — Z1211 Encounter for screening for malignant neoplasm of colon: Secondary | ICD-10-CM | POA: Diagnosis not present

## 2022-04-21 DIAGNOSIS — Z114 Encounter for screening for human immunodeficiency virus [HIV]: Secondary | ICD-10-CM | POA: Diagnosis not present

## 2022-04-21 MED ORDER — ALBUTEROL SULFATE (2.5 MG/3ML) 0.083% IN NEBU: 2.5000 mg | INHALATION_SOLUTION | Freq: Four times a day (QID) | RESPIRATORY_TRACT | 0 refills | Status: DC | PRN

## 2022-04-21 NOTE — Assessment & Plan Note (Addendum)
-  respiratory exam unremarkable ?-reassuringly improved, discussed that it is normal for cough to linger, encouraged support measures  ?-recent CXR unremarkable ?-albuterol refilled ?-concern that patient may have COPD or are her symptoms from recent bronchitis, instructed to follow up in 2-3 weeks for another evaluation so we can better determine next steps at that time, consider CXR if symptoms worsened and maybe PFTs in the future. She is quit smoking which will help her, congratulated patient on this accomplishment ?

## 2022-04-21 NOTE — Patient Instructions (Signed)
It was great seeing you today! ? ?We are glad that you are choosing [place] for your primary care needs! We are also getting blood work, I will notify you of any abnormal results. Regarding your recent bronchitis, I am glad that you are feeling better. For the cough, I encourage using honey. I have refilled your albuterol.  ? ?I have placed a referral to the gastroenterologist for a colonoscopy, you should hear from them in 2 weeks, if not then please contact our office for assistance with scheduling.  ? ?Please follow up at your next scheduled appointment in 2-3 weeks, if anything arises between now and then, please don't hesitate to contact our office. ? ? ?Thank you for allowing [place] to be a part of your medical care! ? ?Thank you, ?Dr. Larae Grooms  ?

## 2022-04-21 NOTE — Progress Notes (Signed)
? ? ?  SUBJECTIVE:  ? ?CHIEF COMPLAINT / HPI:  ? ?Patient presents for a physical and to establish care. She has not had a PCP in years, sees a psychiatrist, gynecologist and dentist over the last few years only. Past medical history includes bipolar disorder, restless leg syndrome, genital herpes and nightmares. Family history includes bipolar disorder and COPD in mother and father passed away but he had Parkinson's. Also family  history of thyroid disorder. No known drug allergies. Meds reviewed, they are all prescribed by gynecologist or psychiatrist. Former smoker 1 pack a day but quit 2 years ago. Denies alcohol and drug use. ? ?Reports that she recently was diagnosed with bronchitis which she has had in the past, although not often. She had had prior imaging and was given a course of antibiotic along with steroid which she has completed. Denies fever and chills but endorsing a cough that seems to be worse at night. Endorses occasional dyspnea when doing things around the house which quickly resolves. Symptoms have improved since their onset.  ?  ? ?OBJECTIVE:  ? ?BP 123/76   Pulse 83   Ht '5\' 7"'$  (1.702 m)   Wt 166 lb 6.4 oz (75.5 kg)   LMP 03/30/2022   SpO2 94%   BMI 26.06 kg/m?   ?General: Patient well-appearing, in no acute distress. ?HEENT: PERRLA, non-bulging and nonerythematous TM bilaterally, normal buccal mucosa without tonsillar edema erythema or exudate noted, non-tender thyroid, no evidence of cervical LAD ?CV: RRR, no murmurs or gallops auscultated ?Resp: CTAB, no wheezing, rales or rhonchi noted, no signs of respiratory distress and is breathing comfortably on room air  ?Abdomen: soft, nontender, nondistended, presence of bowel sounds ?Ext: no LE edema noted bilaterally, radial and distal pulses strong and equal bilaterally ?Neuro: normal gait  ? ?ASSESSMENT/PLAN:  ? ?Health maintenance  ?-PAP up to date and performed by gynecologist ?-CMP, CBC, lipid panel, TSH, HIV screening and Hep C  screening obtainted ?-Most recent mammogram in August 2022 was normal and recommended repeat screening in August 2023.  ?-Med rec reviewed and updated appropriately ?-No family history of colon cancer. Discussed importance of screening testing for colon cancer and that colonoscopy is the gold standard, GI referral placed per patient preference for colonoscopy.  ? ?History of bronchitis ?-respiratory exam unremarkable ?-reassuringly improved, discussed that it is normal for cough to linger, encouraged support measures  ?-recent CXR unremarkable ?-albuterol refilled ?-concern that patient may have COPD or are her symptoms from recent bronchitis, instructed to follow up in 2-3 weeks for another evaluation so we can better determine next steps at that time, consider CXR if symptoms worsened and maybe PFTs in the future. She is quit smoking which will help her, congratulated patient on this accomplishment ?  ? ? ?Donney Dice, DO ?[place]  ?

## 2022-04-22 ENCOUNTER — Other Ambulatory Visit: Payer: Self-pay | Admitting: Psychiatry

## 2022-04-22 ENCOUNTER — Encounter: Payer: Self-pay | Admitting: Family Medicine

## 2022-04-22 DIAGNOSIS — F4310 Post-traumatic stress disorder, unspecified: Secondary | ICD-10-CM

## 2022-04-22 DIAGNOSIS — F411 Generalized anxiety disorder: Secondary | ICD-10-CM

## 2022-04-22 DIAGNOSIS — G2581 Restless legs syndrome: Secondary | ICD-10-CM | POA: Insufficient documentation

## 2022-04-22 LAB — CBC
Hematocrit: 42.6 % (ref 34.0–46.6)
Hemoglobin: 14.5 g/dL (ref 11.1–15.9)
MCH: 31.3 pg (ref 26.6–33.0)
MCHC: 34 g/dL (ref 31.5–35.7)
MCV: 92 fL (ref 79–97)
Platelets: 338 10*3/uL (ref 150–450)
RBC: 4.64 x10E6/uL (ref 3.77–5.28)
RDW: 12.2 % (ref 11.7–15.4)
WBC: 8.7 10*3/uL (ref 3.4–10.8)

## 2022-04-22 LAB — COMPREHENSIVE METABOLIC PANEL
ALT: 10 IU/L (ref 0–32)
AST: 19 IU/L (ref 0–40)
Albumin/Globulin Ratio: 2.4 — ABNORMAL HIGH (ref 1.2–2.2)
Albumin: 4.6 g/dL (ref 3.8–4.8)
Alkaline Phosphatase: 78 IU/L (ref 44–121)
BUN/Creatinine Ratio: 13 (ref 9–23)
BUN: 10 mg/dL (ref 6–24)
Bilirubin Total: 0.4 mg/dL (ref 0.0–1.2)
CO2: 20 mmol/L (ref 20–29)
Calcium: 9.6 mg/dL (ref 8.7–10.2)
Chloride: 103 mmol/L (ref 96–106)
Creatinine, Ser: 0.77 mg/dL (ref 0.57–1.00)
Globulin, Total: 1.9 g/dL (ref 1.5–4.5)
Glucose: 91 mg/dL (ref 70–99)
Potassium: 4.4 mmol/L (ref 3.5–5.2)
Sodium: 137 mmol/L (ref 134–144)
Total Protein: 6.5 g/dL (ref 6.0–8.5)
eGFR: 96 mL/min/{1.73_m2} (ref >59)

## 2022-04-22 LAB — LIPID PANEL
Chol/HDL Ratio: 3.2 ratio (ref 0.0–4.4)
Cholesterol, Total: 215 mg/dL — ABNORMAL HIGH (ref 100–199)
HDL: 67 mg/dL (ref >39)
LDL Chol Calc (NIH): 135 mg/dL — ABNORMAL HIGH (ref 0–99)
Triglycerides: 75 mg/dL (ref 0–149)
VLDL Cholesterol Cal: 13 mg/dL (ref 5–40)

## 2022-04-22 LAB — HIV ANTIBODY (ROUTINE TESTING W REFLEX): HIV Screen 4th Generation wRfx: NONREACTIVE

## 2022-04-22 LAB — TSH: TSH: 1.72 u[IU]/mL (ref 0.450–4.500)

## 2022-04-22 LAB — HCV AB W REFLEX TO QUANT PCR: HCV Ab: NONREACTIVE

## 2022-04-22 LAB — HCV INTERPRETATION

## 2022-04-30 ENCOUNTER — Ambulatory Visit (INDEPENDENT_AMBULATORY_CARE_PROVIDER_SITE_OTHER): Payer: Medicare HMO | Admitting: Family Medicine

## 2022-04-30 ENCOUNTER — Encounter: Payer: Self-pay | Admitting: Family Medicine

## 2022-04-30 VITALS — BP 106/60 | HR 72 | Ht 67.0 in | Wt 169.8 lb

## 2022-04-30 DIAGNOSIS — Z8709 Personal history of other diseases of the respiratory system: Secondary | ICD-10-CM | POA: Diagnosis not present

## 2022-04-30 NOTE — Assessment & Plan Note (Signed)
-  patient recovering well, symptoms significantly improved and close to complete resolution. No need for CXR or PFTs at this time, maybe can consider in the future if symptoms worsen. If this occurs, then patient may also benefit from pulmonology outpatient follow up but non needed at this time ?

## 2022-04-30 NOTE — Patient Instructions (Signed)
It was great seeing you today! ? ?Today we discussed your respiratory symptoms, I am glad that you feel better and your exam was normal as well! You have good air movement throughout all parts of your lungs. Since you seem to be recovering well, I do not think we need to get imaging or do any more work up.  ? ?Continue to use honey and drink warm tea to relieve the cough, this can unfortunately take awhile to completely resolve.  ? ?Please follow up at your next scheduled appointment in 1 year for another physical, if anything arises between now and then, please don't hesitate to contact our office. ? ? ?Thank you for allowing [place] to be a part of your medical care! ? ?Thank you, ?Dr. Larae Grooms  ?

## 2022-04-30 NOTE — Progress Notes (Signed)
? ? ?  SUBJECTIVE:  ? ?CHIEF COMPLAINT / HPI:  ? ?Patient presents for follow up, has a history of bronchitis and recently recovered from an episode. Denies fever, chills, chest pain, nausea, vomiting and pain. Cough has improved but still lingers, no sputum production. Mild dyspnea that has significantly improved with time and since last visit, says that this is intermittent with activity but does not occur with exertion all the time. She has never seen a pulmonologist before. Former tobacco user but quit in November 2021, denies cravings.  ? ?OBJECTIVE:  ? ?BP 106/60   Pulse 72   Ht '5\' 7"'$  (1.702 m)   Wt 169 lb 12.8 oz (77 kg)   LMP 04/26/2022   SpO2 93%   BMI 26.59 kg/m?   ?General: Patient well-appearing, in no acute distress. ?HEENT: PERRLA, normal buccal mucosa  ?CV: RRR, no murmurs or gallops auscultated  ?Resp: CTAB, no wheezing, rales or rhonchi noted, good air movement throughout all lung fields without focal findings, breathing comfortably on room air ?Psych: mood appropriate  ? ?ASSESSMENT/PLAN:  ? ?History of bronchitis ?-patient recovering well, symptoms significantly improved and close to complete resolution. No need for CXR or PFTs at this time, maybe can consider in the future if symptoms worsen. If this occurs, then patient may also benefit from pulmonology outpatient follow up but non needed at this time ? ?-Discussed prior blood work in person as well.  ?-Awaiting GI for colonoscopy.  ?-PHQ-9 score of 8 with negative question 9 reviewed.  ? ?Donney Dice, DO ?[place]  ?

## 2022-05-05 ENCOUNTER — Other Ambulatory Visit: Payer: Self-pay | Admitting: Psychiatry

## 2022-05-05 DIAGNOSIS — F515 Nightmare disorder: Secondary | ICD-10-CM

## 2022-05-05 DIAGNOSIS — F4310 Post-traumatic stress disorder, unspecified: Secondary | ICD-10-CM

## 2022-05-05 DIAGNOSIS — G2581 Restless legs syndrome: Secondary | ICD-10-CM

## 2022-05-06 ENCOUNTER — Other Ambulatory Visit: Payer: Self-pay | Admitting: Family Medicine

## 2022-05-06 DIAGNOSIS — Z8709 Personal history of other diseases of the respiratory system: Secondary | ICD-10-CM

## 2022-05-14 ENCOUNTER — Ambulatory Visit: Payer: Medicare HMO | Admitting: Family Medicine

## 2022-05-20 ENCOUNTER — Encounter: Payer: Self-pay | Admitting: *Deleted

## 2022-05-30 ENCOUNTER — Encounter: Payer: Self-pay | Admitting: Gastroenterology

## 2022-05-30 ENCOUNTER — Encounter: Payer: Self-pay | Admitting: Student

## 2022-05-30 ENCOUNTER — Ambulatory Visit (INDEPENDENT_AMBULATORY_CARE_PROVIDER_SITE_OTHER): Payer: Medicare HMO | Admitting: Student

## 2022-05-30 ENCOUNTER — Ambulatory Visit: Payer: Medicare HMO

## 2022-05-30 VITALS — BP 100/60 | HR 76 | Temp 98.3°F | Ht 67.0 in | Wt 170.0 lb

## 2022-05-30 DIAGNOSIS — R059 Cough, unspecified: Secondary | ICD-10-CM

## 2022-05-30 MED ORDER — PREDNISONE 20 MG PO TABS: 40.0000 mg | ORAL_TABLET | Freq: Every day | ORAL | 0 refills | Status: AC

## 2022-05-30 NOTE — Progress Notes (Signed)
    SUBJECTIVE:   CHIEF COMPLAINT / HPI:   Cough and wheezing  Symptoms for past month, has been seen multiple times for same issue. Had PNA about a month ago and then had bronchitis. States the dry cough and wheezing have worsened since her last visit. Has been using albuterol inhaler twice a day and albuterol nebulizer on occasion. Both do help. Denies fevers, chest pain, congestion and rhinorrhea. She admits to SOB with exertion.   Elevated PHQ w/ positive 9 Sees a phychiatric monthly. Has no current plans to harm herself. Has had these thoughts and feeling for years and her psychiatrist knows this. States she is not having thoughts of self harm at this moment in time, has just had them in the past.  She is not concerned about leaving, no intention for self harm. States she talks with her mom and sister when she has these thoughts which helps.  She has an appointment scheduled at the end of the month with her psychiatrist.  PERTINENT  PMH / [facility]: Hx of tobacco use, bronchitis  OBJECTIVE:   BP 100/60   Pulse 76   Temp 98.3 F (36.8 C) (Oral)   Ht '5\' 7"'$  (1.702 m)   Wt 170 lb (77.1 kg)   LMP 05/27/2022   SpO2 94%   BMI 26.63 kg/m    General: NAD, pleasant, able to participate in exam Cardiac: RRR, no murmurs. Respiratory: mild inspiratory wheeze with good air movement throughout.  Skin: warm and dry, no rashes noted Neuro: alert, no obvious focal deficits Psych: Normal affect and mood  ASSESSMENT/PLAN:   Cough in adult patient As cough and wheezing have worsened since patient's last visit, will now refer patient to Dr. Valentina Lucks for PFTs.  I will also treat patient with a 5-day course of prednisone to see if this helps.  I suspect patient may have COPD due to her significant history of smoking. Will not get CXR at this time as patient had one in April which was normal.      Dr. Precious Gilding, [place]

## 2022-05-30 NOTE — Progress Notes (Deleted)
    SUBJECTIVE:   CHIEF COMPLAINT / HPI:   ***  PERTINENT  PMH / PSH: ***  OBJECTIVE:   LMP 04/26/2022    General: Alert, no acute distress Cardio: Normal S1 and S2, RRR, no r/m/g Pulm: CTAB, normal work of breathing Abdomen: Bowel sounds normal. Abdomen soft and non-tender.  Extremities: No peripheral edema.  Neuro: Cranial nerves grossly intact   ASSESSMENT/PLAN:   No problem-specific Assessment & Plan notes found for this encounter.     Carollee Leitz, MD [place]

## 2022-05-30 NOTE — Patient Instructions (Incomplete)
Thank you for coming to see me today. It was a pleasure.   You are due for a colonoscopy.  Please use the form that we have given you to schedule this at your convenience.    Please follow-up with PCP as needed  If you have any questions or concerns, please do not hesitate to call the office at [phone].  Best,   Carollee Leitz, MD

## 2022-05-30 NOTE — Patient Instructions (Addendum)
It was great to see you! Thank you for allowing me to participate in your care!  Our plans for today:  - I have sent in a prescription for 5 days of prednisone to your pharmacy - Please schedule an appointment with Dr. Valentina Lucks for pulmonary function tests    If you are feeling suicidal or depression symptoms worsen please immediately go to:   If you are thinking about harming yourself or having thoughts of suicide, or if you know someone who is, seek help right away. If you are in crisis, make sure you are not left alone.  If someone else is in crisis, make sure he/she/they is not left alone  Call 988 OR 1-800-273-TALK  24 Hour Availability for [place]  [address] [place], [place] [facility] [phone]    Other crisis resources:  [facility] [employer] (Domestic Violence, Rape & Victim Assistance [phone]  [place]    (ONLY from 8am-4pm)    [phone]  [facility] (24/7)   [phone]  [place] National Suicide Hotline   [phone] (TALK)   Take care and seek immediate care sooner if you develop any concerns.   Dr. Precious Gilding, DO [facility]

## 2022-05-30 NOTE — Assessment & Plan Note (Addendum)
As cough and wheezing have worsened since patient's last visit, will now refer patient to Dr. Valentina Lucks for PFTs.  I will also treat patient with a 5-day course of prednisone to see if this helps.  I suspect patient may have COPD due to her significant history of smoking. Will not get CXR at this time as patient had one in April which was normal.

## 2022-06-02 ENCOUNTER — Ambulatory Visit (INDEPENDENT_AMBULATORY_CARE_PROVIDER_SITE_OTHER): Payer: Medicare HMO | Admitting: Pharmacist

## 2022-06-02 ENCOUNTER — Encounter: Payer: Self-pay | Admitting: Pharmacist

## 2022-06-02 DIAGNOSIS — F315 Bipolar disorder, current episode depressed, severe, with psychotic features: Secondary | ICD-10-CM | POA: Diagnosis not present

## 2022-06-02 DIAGNOSIS — Z8709 Personal history of other diseases of the respiratory system: Secondary | ICD-10-CM

## 2022-06-02 DIAGNOSIS — R059 Cough, unspecified: Secondary | ICD-10-CM

## 2022-06-02 MED ORDER — ANORO ELLIPTA 62.5-25 MCG/ACT IN AEPB: 1.0000 | INHALATION_SPRAY | Freq: Every day | RESPIRATORY_TRACT | 0 refills | Status: DC

## 2022-06-02 NOTE — Progress Notes (Signed)
   S:    Patient arrives in good spirits without assistance.  Presents for lung function evaluation.    Patient was referred by Dr. Ronnald Ramp on 05/30/2022.  Patient was last seen by Primary Care Provider, Dr. Larae Grooms, on 04/30/2022.   Patient reports breathing has been difficult since mid-April of this year. Patient reports needing to use her nebulizer this morning. She is currently taking prednisone 20 mg to treat cough/wheezing from last clinic visit with Dr. Ronnald Ramp on 05/30/2022. Patient reports this is her third course of prednisone since May 2023.   Medication adherence reported.  Patient reports last dose albuterol was this morning.  Current medications: albuterol inhaler and albuterol nebulizer solution  Rescue inhaler use frequency: at least once/day  Smoking Hx  1 ppd for ~ 27 years (Slims/Lights/silver - not full flavor)   O: Physical Exam Constitutional:      Appearance: Normal appearance. She is normal weight.  Pulmonary:     Effort: Pulmonary effort is normal. No respiratory distress.  Neurological:     Mental Status: She is alert.  Psychiatric:        Mood and Affect: Mood normal.        Behavior: Behavior normal.        Thought Content: Thought content normal.     Review of Systems  Respiratory:  Positive for shortness of breath.   All other systems reviewed and are negative.   Vitals:   06/02/22 0939  BP: 111/68  Pulse: 84  SpO2: 96%    A/P: Patient has been experiencing intermittent cough/wheezing since Mid-April of 2023 and was diagnosed with  pneumonia May. Since then she has had 3 courses of prednisone to help with exacerbations. Patient also has an albuterol inhaler and albuterol nebulizer. Patient has a tobacco hx of 27 pack-years (started at age 58), and has quit for 2 years.  - NO Spirometry today due to symptoms.  Appears she is subjectively Beta-Agonist responsive - Start Anoro Ellipta 62.5 mcg/25 mcg one inhalation/day to help with shortness of breath  symptoms  - Continue albuterol inhaler as needed for rescue  - Educated patient on purpose, proper use, potential adverse effects including the potential for dry mouth.  Discussed Patient with Dr. Owens Shark.   Written pt instructions provided.  F/U Clinic visit with Larae Grooms Asked patient to share response via [place] in 1 week with to PCP. At that time - Breathing assessment and assessment of LABA/LAMA therapy and new plan.  IF LABA/LAMA responsive, switch to [employer] Respimat as this preferred on her insurance.  Send new prescription at that time.   - Consideration at that time for Pulm Referral or need for escalation of therapy.  - Patient aware that if her breathing improves/resolves then PFTs may be appropriate in 8-12  weeks.  If Pulm referral, she would likely have PFTs done at that office.  Total time in face to face counseling 38 minutes.  Patient seen with Berdie Ogren, PharmD Candidate.

## 2022-06-02 NOTE — Patient Instructions (Signed)
Nice to meet you today.   Please communicate how your [facility] Ellipta changes your breathing in 1 week.   Take 1 inhalation once per day.

## 2022-06-02 NOTE — Assessment & Plan Note (Signed)
Patient has been experiencing intermittent cough/wheezing since Mid-April of 2023 and was diagnosed with  pneumonia May. Since then she has had 3 courses of prednisone to help with exacerbations. Patient also has an albuterol inhaler and albuterol nebulizer. Patient has a tobacco hx of 27 pack-years (started at age 46), and has quit for 2 years.  - NO Spirometry today due to symptoms.  Appears she is subjectively Beta-Agonist responsive - Start Anoro Ellipta 62.5 mcg/25 mcg one inhalation/day to help with shortness of breath symptoms  - Continue albuterol inhaler as needed for rescue  - Educated patient on purpose, proper use, potential adverse effects including the potential for dry mouth.  Discussed Patient with Dr. Owens Shark.   Written pt instructions provided.  F/U Clinic visit with Larae Grooms Asked patient to share response via [place] in 1 week with to PCP. At that time - Breathing assessment and assessment of LABA/LAMA therapy and new plan.  IF LABA/LAMA responsive, switch to [employer] Respimat as this preferred on her insurance.  Send new prescription at that time

## 2022-06-02 NOTE — Progress Notes (Signed)
Reviewed: I agree with Dr. Koval's documentation and management. 

## 2022-06-02 NOTE — Assessment & Plan Note (Signed)
Patient has been experiencing intermittent cough/wheezing since Mid-April of 2023 and was diagnosed with  pneumonia May. Since then she has had 3 courses of prednisone to help with exacerbations. Patient also has an albuterol inhaler and albuterol nebulizer. Patient has a tobacco hx of 27 pack-years (started at age 46), and has quit for 2 years.  - NO Spirometry today due to symptoms.  Appears she is subjectively Beta-Agonist responsive - Start Anoro Ellipta 62.5 mcg/25 mcg one inhalation/day to help with shortness of breath symptoms  - Continue albuterol inhaler as needed for rescue  - Educated patient on purpose, proper use, potential adverse effects including the potential for dry mouth.  Discussed Patient with Dr. Owens Shark.   Written pt instructions provided.  F/U Clinic visit with Larae Grooms Asked patient to share response via [place] in 1 week with to PCP. At that time - Breathing assessment and assessment of LABA/LAMA therapy and new plan.  IF LABA/LAMA responsive, switch to [employer] Respimat as this preferred on her insurance.  Send new prescription at that time.

## 2022-06-02 NOTE — Assessment & Plan Note (Deleted)
Patient has been experiencing intermittent cough/wheezing since Mid-April of 2023 and was diagnosed with  pneumonia May. Since then she has had 3 courses of prednisone to help with exacerbations. Patient also has an albuterol inhaler and albuterol nebulizer. Patient has a tobacco hx of 27 pack-years (started at age 46), and has quit for 2 years.  - NO Spirometry today due to symptoms.  Appears she is subjectively Beta-Agonist responsive - Start Anoro Ellipta 62.5 mcg/25 mcg one inhalation/day to help with shortness of breath symptoms  - Continue albuterol inhaler as needed for rescue  - Educated patient on purpose, proper use, potential adverse effects including the potential for dry mouth.  Discussed Patient with Dr. Owens Shark.   Written pt instructions provided.  F/U Clinic visit with Larae Grooms Asked patient to share response via [place] in 1 week with to PCP. At that time - Breathing assessment and assessment of LABA/LAMA therapy and new plan.  IF LABA/LAMA responsive, switch to [employer] Respimat as this preferred on her insurance.  Send new prescription at that time

## 2022-06-05 ENCOUNTER — Ambulatory Visit (AMBULATORY_SURGERY_CENTER): Payer: Self-pay

## 2022-06-05 VITALS — Ht 67.0 in | Wt 180.0 lb

## 2022-06-05 DIAGNOSIS — Z1211 Encounter for screening for malignant neoplasm of colon: Secondary | ICD-10-CM

## 2022-06-05 NOTE — Progress Notes (Signed)
No egg or soy allergy known to patient  No issues known to pt with past sedation with any surgeries or procedures Patient denies ever being told they had issues or difficulty with intubation  No FH of Malignant Hyperthermia Pt is not on diet pills Pt is not on home 02  Pt is not on blood thinners  Pt reports issues with constipation - takes a laxative every 3-4 days and increases water intake- patient advised to REALLY increase water intake, activity, and fruits/veggies for the week prior to procedure; and continue to take gentle laxatives/stool softeners;  No A fib or A flutter NO PA's for preps discussed with pt in PV today  Discussed with pt there will be an out-of-pocket cost for prep and that varies from $0 to 70 + dollars - pt verbalized understanding  Pt instructed to use Singlecare.com or GoodRx for a price reduction on prep  Pt verified name, DOB, address and insurance during PV today.  Pt given instruction packet to read and not return.  Pt encouraged to call with questions or issues.  Pt has My chart, procedure instructions sent via My Chart

## 2022-06-09 ENCOUNTER — Telehealth: Payer: Self-pay

## 2022-06-10 ENCOUNTER — Encounter: Payer: Self-pay | Admitting: Psychiatry

## 2022-06-10 ENCOUNTER — Telehealth: Payer: Self-pay

## 2022-06-10 ENCOUNTER — Ambulatory Visit (INDEPENDENT_AMBULATORY_CARE_PROVIDER_SITE_OTHER): Payer: Medicare HMO | Admitting: Psychiatry

## 2022-06-10 ENCOUNTER — Telehealth: Payer: Self-pay | Admitting: Psychiatry

## 2022-06-10 DIAGNOSIS — F411 Generalized anxiety disorder: Secondary | ICD-10-CM | POA: Diagnosis not present

## 2022-06-10 DIAGNOSIS — F315 Bipolar disorder, current episode depressed, severe, with psychotic features: Secondary | ICD-10-CM

## 2022-06-10 DIAGNOSIS — F5105 Insomnia due to other mental disorder: Secondary | ICD-10-CM | POA: Diagnosis not present

## 2022-06-10 DIAGNOSIS — G2581 Restless legs syndrome: Secondary | ICD-10-CM | POA: Diagnosis not present

## 2022-06-10 DIAGNOSIS — F99 Mental disorder, not otherwise specified: Secondary | ICD-10-CM

## 2022-06-10 DIAGNOSIS — F4310 Post-traumatic stress disorder, unspecified: Secondary | ICD-10-CM

## 2022-06-10 DIAGNOSIS — F4312 Post-traumatic stress disorder, chronic: Secondary | ICD-10-CM | POA: Diagnosis not present

## 2022-06-10 DIAGNOSIS — F515 Nightmare disorder: Secondary | ICD-10-CM | POA: Diagnosis not present

## 2022-06-10 MED ORDER — DIVALPROEX SODIUM ER 500 MG PO TB24: ORAL_TABLET | ORAL | 3 refills | Status: DC

## 2022-06-10 MED ORDER — ESZOPICLONE 3 MG PO TABS: 3.0000 mg | ORAL_TABLET | Freq: Every day | ORAL | 0 refills | Status: DC

## 2022-06-10 MED ORDER — LORAZEPAM 1 MG PO TABS: 1.0000 mg | ORAL_TABLET | Freq: Four times a day (QID) | ORAL | 1 refills | Status: DC

## 2022-06-10 NOTE — Telephone Encounter (Signed)
Pt was here for an appt this morning.  Dr Jennelle Human was sending in script for Lunesta 3mg .  It's too expensive at [facility].  Pls send it to [facility] where it is $15.  Next appt 9/19

## 2022-06-11 ENCOUNTER — Other Ambulatory Visit: Payer: Self-pay

## 2022-06-11 DIAGNOSIS — F5105 Insomnia due to other mental disorder: Secondary | ICD-10-CM

## 2022-06-11 MED ORDER — ESZOPICLONE 3 MG PO TABS: 3.0000 mg | ORAL_TABLET | Freq: Every day | ORAL | 0 refills | Status: DC

## 2022-06-11 NOTE — Telephone Encounter (Signed)
Pended to Dr. Clovis Pu

## 2022-06-11 NOTE — Telephone Encounter (Signed)
Eszopiclone was to go to [facility] not [facility]. Pls correct.

## 2022-06-11 NOTE — Telephone Encounter (Signed)
Thank you for clearing this up.

## 2022-06-12 ENCOUNTER — Encounter: Payer: Self-pay | Admitting: Gastroenterology

## 2022-06-16 ENCOUNTER — Telehealth: Payer: Self-pay

## 2022-06-16 NOTE — Telephone Encounter (Signed)
Prior Authorization initiated Eszopiclone 3 MG TABS [employer]

## 2022-06-19 ENCOUNTER — Ambulatory Visit (AMBULATORY_SURGERY_CENTER): Payer: Medicare HMO | Admitting: Gastroenterology

## 2022-06-19 ENCOUNTER — Encounter: Payer: Self-pay | Admitting: Gastroenterology

## 2022-06-19 VITALS — BP 120/88 | HR 79 | Temp 98.6°F | Resp 14 | Ht 67.0 in | Wt 180.0 lb

## 2022-06-19 DIAGNOSIS — Z1211 Encounter for screening for malignant neoplasm of colon: Secondary | ICD-10-CM

## 2022-06-19 DIAGNOSIS — D125 Benign neoplasm of sigmoid colon: Secondary | ICD-10-CM | POA: Diagnosis not present

## 2022-06-19 HISTORY — PX: COLONOSCOPY WITH PROPOFOL: SHX5780

## 2022-06-19 HISTORY — PX: COLONOSCOPY: SHX174

## 2022-06-19 MED ORDER — SODIUM CHLORIDE 0.9 % IV SOLN: 500.0000 mL | Freq: Once | INTRAVENOUS | Status: DC

## 2022-06-19 NOTE — Telephone Encounter (Signed)
Prior approval received for Eszopiclone 3 mg effective through 12/14/2022 PA# [id number] with [facility]

## 2022-06-19 NOTE — Progress Notes (Signed)
Sedate, gd SR, tolerated procedure well, VSS, report to RN 

## 2022-06-19 NOTE — Telephone Encounter (Signed)
Patient notified

## 2022-06-19 NOTE — Op Note (Signed)
[place] Patient Name: Beth [name] Procedure Date: 06/19/2022 1:37 PM MRN: [id number] Endoscopist: Nicki Reaper E. Candis Schatz , MD Age: 46 Referring MD:  Date of Birth: 1976/04/23 Gender: Female Account #: [id number] Procedure:                Colonoscopy Indications:              Screening for colorectal malignant neoplasm, This                            is the patient's first colonoscopy Medicines:                Monitored Anesthesia Care Procedure:                Pre-Anesthesia Assessment:                           - Prior to the procedure, a History and Physical                            was performed, and patient medications and                            allergies were reviewed. The patient's tolerance of                            previous anesthesia was also reviewed. The risks                            and benefits of the procedure and the sedation                            options and risks were discussed with the patient.                            All questions were answered, and informed consent                            was obtained. Prior Anticoagulants: The patient has                            taken no previous anticoagulant or antiplatelet                            agents. ASA Grade Assessment: II - A patient with                            mild systemic disease. After reviewing the risks                            and benefits, the patient was deemed in                            satisfactory condition to undergo the procedure.  After obtaining informed consent, the colonoscope                            was passed under direct vision. Throughout the                            procedure, the patient's blood pressure, pulse, and                            oxygen saturations were monitored continuously. The                            CF [id number] #[id number] was introduced through the anus                            and advanced to the  the cecum, identified by                            appendiceal orifice and ileocecal valve. The                            colonoscopy was performed without difficulty. The                            patient tolerated the procedure well. The quality                            of the bowel preparation was adequate. The                            ileocecal valve, appendiceal orifice, and rectum                            were photographed. The bowel preparation used was                            Miralax via split dose instruction. Scope In: 1:44:54 PM Scope Out: 2:05:07 PM Scope Withdrawal Time: 0 hours 13 minutes 41 seconds  Total Procedure Duration: 0 hours 20 minutes 13 seconds  Findings:                 The perianal and digital rectal examinations were                            normal. Pertinent negatives include normal                            sphincter tone and no palpable rectal lesions.                           A 3 mm polyp was found in the sigmoid colon. The                            polyp was sessile. The polyp was removed  with a                            cold snare. Resection and retrieval were complete.                            Estimated blood loss was minimal.                           The exam was otherwise normal throughout the                            examined colon.                           The retroflexed view of the distal rectum and anal                            verge was normal and showed no anal or rectal                            abnormalities. Complications:            No immediate complications. Estimated Blood Loss:     Estimated blood loss was minimal. Impression:               - One 3 mm polyp in the sigmoid colon, removed with                            a cold snare. Resected and retrieved.                           - The distal rectum and anal verge are normal on                            retroflexion view. Recommendation:           - Patient  has a contact number available for                            emergencies. The signs and symptoms of potential                            delayed complications were discussed with the                            patient. Return to normal activities tomorrow.                            Written discharge instructions were provided to the                            patient.                           - Resume previous diet.                           -  Continue present medications.                           - Await pathology results.                           - Repeat colonoscopy (date not yet determined) for                            surveillance based on pathology results. Fernando Stoiber E. Candis Schatz, MD 06/19/2022 2:12:23 PM This report has been signed electronically.

## 2022-06-19 NOTE — Progress Notes (Signed)
Pt's states no medical or surgical changes since previsit or office visit. 

## 2022-06-19 NOTE — Patient Instructions (Signed)
   Handout on polyps given.   YOU HAD AN ENDOSCOPIC PROCEDURE TODAY AT [facility]:   Refer to the procedure report that was given to you for any specific questions about what was found during the examination.  If the procedure report does not answer your questions, please call your gastroenterologist to clarify.  If you requested that your care partner not be given the details of your procedure findings, then the procedure report has been included in a sealed envelope for you to review at your convenience later.  YOU SHOULD EXPECT: Some feelings of bloating in the abdomen. Passage of more gas than usual.  Walking can help get rid of the air that was put into your GI tract during the procedure and reduce the bloating. If you had a lower endoscopy (such as a colonoscopy or flexible sigmoidoscopy) you may notice spotting of blood in your stool or on the toilet paper. If you underwent a bowel prep for your procedure, you may not have a normal bowel movement for a few days.  Please Note:  You might notice some irritation and congestion in your nose or some drainage.  This is from the oxygen used during your procedure.  There is no need for concern and it should clear up in a day or so.  SYMPTOMS TO REPORT IMMEDIATELY:  Following lower endoscopy (colonoscopy or flexible sigmoidoscopy):  Excessive amounts of blood in the stool  Significant tenderness or worsening of abdominal pains  Swelling of the abdomen that is new, acute  Fever of 100F or higher    For urgent or emergent issues, a gastroenterologist can be reached at any hour by calling [phone]. Do not use MyChart messaging for urgent concerns.    DIET:  We do recommend a small meal at first, but then you may proceed to your regular diet.  Drink plenty of fluids but you should avoid alcoholic beverages for 24 hours.  ACTIVITY:  You should plan to take it easy for the rest of today and you should NOT DRIVE or use heavy  machinery until tomorrow (because of the sedation medicines used during the test).    FOLLOW UP: Our staff will call the number listed on your records the next business day following your procedure.  We will call around 7:15- 8:00 am to check on you and address any questions or concerns that you may have regarding the information given to you following your procedure. If we do not reach you, we will leave a message.  If you develop any symptoms (ie: fever, flu-like symptoms, shortness of breath, cough etc.) before then, please call [phone].  If you test positive for Covid 19 in the 2 weeks post procedure, please call and report this information to [place].    If any biopsies were taken you will be contacted by phone or by letter within the next 1-3 weeks.  Please call us at [phone] if you have not heard about the biopsies in 3 weeks.    SIGNATURES/CONFIDENTIALITY: You and/or your care partner have signed paperwork which will be entered into your electronic medical record.  These signatures attest to the fact that that the information above on your After Visit Summary has been reviewed and is understood.  Full responsibility of the confidentiality of this discharge information lies with you and/or your care-partner.

## 2022-06-19 NOTE — Progress Notes (Signed)
[facility] History and Physical   Primary Care Physician:  Donney Dice, DO   Reason for Procedure:   Colon cancer screening  Plan:    Screening colonoscopy     HPI: Beth [name] is a 46 y.o. female undergoing initial average risk screening colonoscopy.  She has no family history of colon cancer and no chronic GI symptoms.    Past Medical History:  Diagnosis Date   Anxiety    on meds   Bipolar disorder ([place])    Depression    on meds   HSV (herpes simplex virus) anogenital infection 05/2018   RLS (restless legs syndrome)    Seasonal allergies     Past Surgical History:  Procedure Laterality Date   BARTHOLIN CYST MARSUPIALIZATION     COLONOSCOPY  06/19/2022   IVF     WISDOM TOOTH EXTRACTION      Prior to Admission medications   Medication Sig Start Date End Date Taking? Authorizing Provider  cyclobenzaprine (FLEXERIL) 10 MG tablet TAKE 2 TABLETS AT BEDTIME 05/05/22  Yes Cottle, Billey Co., MD  divalproex (DEPAKOTE ER) 500 MG 24 hr tablet TAKE TWO TABLETS BY MOUTH EVERY NIGHT AT BEDTIME 06/10/22  Yes Cottle, Billey Co., MD  doxazosin (CARDURA) 4 MG tablet TAKE 1 AND 1/2 TABLETS AT BEDTIME 05/05/22  Yes Cottle, Billey Co., MD  Eszopiclone 3 MG TABS Take 1 tablet (3 mg total) by mouth at bedtime. Take immediately before bedtime 06/11/22  Yes Cottle, Billey Co., MD  gabapentin (NEURONTIN) 400 MG capsule TAKE 1 CAPSULE TWICE DAILY  AND TAKE 2 CAPSULES IN THE EVENING 02/20/22  Yes Cottle, Billey Co., MD  LORazepam (ATIVAN) 1 MG tablet Take 1 tablet (1 mg total) by mouth 4 (four) times daily. 06/10/22  Yes Cottle, Billey Co., MD  Lurasidone HCl (LATUDA) 60 MG TABS Take 1 tablet (60 mg total) by mouth daily with supper. 04/07/22  Yes Cottle, Billey Co., MD  rOPINIRole (REQUIP) 2 MG tablet TAKE 1 TABLET AT 5 PM. TAKE IN ADDITION TO '8MG'$  AT BEDTIME. 05/05/22  Yes Cottle, Billey Co., MD  rOPINIRole (REQUIP) 4 MG tablet TAKE 2 TABLETS AT BEDTIME 05/05/22  Yes Cottle,  Billey Co., MD  sertraline (ZOLOFT) 100 MG tablet Take 1 tablet (100 mg total) by mouth daily. 03/30/22  Yes Cottle, Billey Co., MD  albuterol (PROVENTIL) (2.5 MG/3ML) 0.083% nebulizer solution USE THREE MILLILITERS VIA NEBULIZATION BY MOUTH EVERY 6 HOURS AS NEEDED FOR WHEEZING OR SHORTNESS OF BREATH 05/06/22   Ganta, Anupa, DO  albuterol (VENTOLIN HFA) 108 (90 Base) MCG/ACT inhaler Inhale 1-2 puffs into the lungs every 6 (six) hours as needed for wheezing or shortness of breath. 04/03/22   Talbot Grumbling, FNP  valACYclovir (VALTREX) 500 MG tablet Take one tablet by mouth twice daily for 3-5 days then daily as needed. 09/18/21   Tamela Gammon, NP    Current Outpatient Medications  Medication Sig Dispense Refill   cyclobenzaprine (FLEXERIL) 10 MG tablet TAKE 2 TABLETS AT BEDTIME 180 tablet 0   divalproex (DEPAKOTE ER) 500 MG 24 hr tablet TAKE TWO TABLETS BY MOUTH EVERY NIGHT AT BEDTIME 60 tablet 3   doxazosin (CARDURA) 4 MG tablet TAKE 1 AND 1/2 TABLETS AT BEDTIME 135 tablet 0   Eszopiclone 3 MG TABS Take 1 tablet (3 mg total) by mouth at bedtime. Take immediately before bedtime 30 tablet 0   gabapentin (NEURONTIN) 400 MG capsule TAKE 1  CAPSULE TWICE DAILY  AND TAKE 2 CAPSULES IN THE EVENING 360 capsule 0   LORazepam (ATIVAN) 1 MG tablet Take 1 tablet (1 mg total) by mouth 4 (four) times daily. 120 tablet 1   Lurasidone HCl (LATUDA) 60 MG TABS Take 1 tablet (60 mg total) by mouth daily with supper. 90 tablet 0   rOPINIRole (REQUIP) 2 MG tablet TAKE 1 TABLET AT 5 PM. TAKE IN ADDITION TO '8MG'$  AT BEDTIME. 90 tablet 0   rOPINIRole (REQUIP) 4 MG tablet TAKE 2 TABLETS AT BEDTIME 180 tablet 0   sertraline (ZOLOFT) 100 MG tablet Take 1 tablet (100 mg total) by mouth daily. 90 tablet 0   albuterol (PROVENTIL) (2.5 MG/3ML) 0.083% nebulizer solution USE THREE MILLILITERS VIA NEBULIZATION BY MOUTH EVERY 6 HOURS AS NEEDED FOR WHEEZING OR SHORTNESS OF BREATH 75 mL 0   albuterol (VENTOLIN HFA) 108 (90  Base) MCG/ACT inhaler Inhale 1-2 puffs into the lungs every 6 (six) hours as needed for wheezing or shortness of breath. 8 g 0   valACYclovir (VALTREX) 500 MG tablet Take one tablet by mouth twice daily for 3-5 days then daily as needed. 90 tablet 1   Current Facility-Administered Medications  Medication Dose Route Frequency Provider Last Rate Last Admin   0.9 %  sodium chloride infusion  500 mL Intravenous Once Daryel November, MD        Allergies as of 06/19/2022   (No Known Allergies)    Family History  Problem Relation Age of Onset   Thyroid disease Mother    COPD Mother    Bipolar disorder Mother    Parkinson's disease Father    Bipolar disorder Sister    Breast cancer Paternal Grandmother 98   Schizophrenia Other    Colon cancer Neg Hx    Colon polyps Neg Hx    Esophageal cancer Neg Hx    Rectal cancer Neg Hx    Stomach cancer Neg Hx     Social History   Socioeconomic History   Marital status: Married    Spouse name: Not on file   Number of children: 0   Years of education: 16   Highest education level: Associate degree: academic program  Occupational History   Occupation: unemployed  Tobacco Use   Smoking status: Former    Packs/day: 1.00    Years: 27.00    Total pack years: 27.00    Types: Cigarettes    Start date: 12/15/1992    Quit date: 12/16/2019    Years since quitting: 2.5   Smokeless tobacco: Never   Tobacco comments:    Started at 46 years old. 1 pack/day. Quit 2 years ago.   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    Social Determinants of  Health   Financial Resource Strain: Not on file  Food Insecurity: Not on file  Transportation Needs: Not on file  Physical Activity: Not on file  Stress: Not on file  Social Connections: Not on file  Intimate Partner  Violence: Not on file    Review of Systems:  All other review of systems negative except as mentioned in the HPI.  Physical Exam: Vital signs BP (!) 116/50   Pulse 100   Temp 98.6 F (37 C) (Temporal)   Ht '5\' 7"'$  (1.702 m)   Wt 180 lb (81.6 kg)   LMP 05/27/2022   SpO2 98%   BMI 28.19 kg/m   General:   Alert,  Well-developed, well-nourished, pleasant and cooperative in NAD Airway:  Mallampati 2 Lungs:  Clear throughout to auscultation.   Heart:  Regular rate and rhythm; no murmurs, clicks, rubs,  or gallops. Abdomen:  Soft, nontender and nondistended. Normal bowel sounds.   Neuro/Psych:  Normal mood and affect. A and O x 3   Syvilla Martin E. Candis Schatz, MD [facility]

## 2022-06-20 ENCOUNTER — Telehealth: Payer: Self-pay

## 2022-06-20 NOTE — Telephone Encounter (Signed)
No answer, left message to call if having any issues or problems, B.Lizzet Hendley RN.

## 2022-06-21 ENCOUNTER — Other Ambulatory Visit: Payer: Self-pay | Admitting: Psychiatry

## 2022-06-21 DIAGNOSIS — F411 Generalized anxiety disorder: Secondary | ICD-10-CM

## 2022-06-21 DIAGNOSIS — F315 Bipolar disorder, current episode depressed, severe, with psychotic features: Secondary | ICD-10-CM

## 2022-06-21 DIAGNOSIS — F4310 Post-traumatic stress disorder, unspecified: Secondary | ICD-10-CM

## 2022-06-24 ENCOUNTER — Other Ambulatory Visit: Payer: Self-pay | Admitting: Psychiatry

## 2022-06-24 DIAGNOSIS — F315 Bipolar disorder, current episode depressed, severe, with psychotic features: Secondary | ICD-10-CM

## 2022-06-26 NOTE — Progress Notes (Signed)
Beth [name],   The polyp which I removed during your recent procedure was proven to be completely benign but is considered a "pre-cancerous" polyp that MAY have grown into cancer if it had not been removed.  Studies shows that at least 20% of women over age 47 and 30% of men over age 31 have pre-cancerous polyps.  Based on current nationally recognized surveillance guidelines, I recommend that you have a repeat colonoscopy in 7 years.   If you develop any new rectal bleeding, abdominal pain or significant bowel habit changes, please contact me before then.

## 2022-07-15 ENCOUNTER — Other Ambulatory Visit: Payer: Self-pay | Admitting: Psychiatry

## 2022-07-15 DIAGNOSIS — F5105 Insomnia due to other mental disorder: Secondary | ICD-10-CM

## 2022-07-15 NOTE — Telephone Encounter (Signed)
Filled 6/30 appt 8/3

## 2022-07-17 ENCOUNTER — Encounter: Payer: Self-pay | Admitting: Psychiatry

## 2022-07-17 ENCOUNTER — Ambulatory Visit (INDEPENDENT_AMBULATORY_CARE_PROVIDER_SITE_OTHER): Payer: Medicare HMO | Admitting: Psychiatry

## 2022-07-17 DIAGNOSIS — F315 Bipolar disorder, current episode depressed, severe, with psychotic features: Secondary | ICD-10-CM | POA: Diagnosis not present

## 2022-07-17 DIAGNOSIS — F5105 Insomnia due to other mental disorder: Secondary | ICD-10-CM | POA: Diagnosis not present

## 2022-07-17 DIAGNOSIS — F4310 Post-traumatic stress disorder, unspecified: Secondary | ICD-10-CM | POA: Diagnosis not present

## 2022-07-17 DIAGNOSIS — F4312 Post-traumatic stress disorder, chronic: Secondary | ICD-10-CM

## 2022-07-17 DIAGNOSIS — F411 Generalized anxiety disorder: Secondary | ICD-10-CM | POA: Diagnosis not present

## 2022-07-17 DIAGNOSIS — G2581 Restless legs syndrome: Secondary | ICD-10-CM | POA: Diagnosis not present

## 2022-07-17 DIAGNOSIS — F515 Nightmare disorder: Secondary | ICD-10-CM

## 2022-07-17 DIAGNOSIS — F99 Mental disorder, not otherwise specified: Secondary | ICD-10-CM | POA: Diagnosis not present

## 2022-07-17 MED ORDER — LAMOTRIGINE 25 MG PO TABS: ORAL_TABLET | ORAL | 0 refills | Status: DC
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
## 2022-08-05 ENCOUNTER — Other Ambulatory Visit: Payer: Self-pay

## 2022-08-05 MED ORDER — ALBUTEROL SULFATE HFA 108 (90 BASE) MCG/ACT IN AERS: 1.0000 | INHALATION_SPRAY | Freq: Four times a day (QID) | RESPIRATORY_TRACT | 0 refills | Status: DC | PRN

## 2022-08-12 ENCOUNTER — Ambulatory Visit (INDEPENDENT_AMBULATORY_CARE_PROVIDER_SITE_OTHER): Payer: Medicare HMO | Admitting: Family Medicine

## 2022-08-12 VITALS — BP 115/74 | HR 75 | Temp 97.5°F | Ht 67.0 in | Wt 170.0 lb

## 2022-08-12 DIAGNOSIS — J309 Allergic rhinitis, unspecified: Secondary | ICD-10-CM

## 2022-08-12 MED ORDER — FLUTICASONE PROPIONATE 50 MCG/ACT NA SUSP: 2.0000 | Freq: Every day | NASAL | 6 refills | Status: AC

## 2022-08-12 NOTE — Assessment & Plan Note (Signed)
-  seems most consistent with allergies, prescribed flonase  -reassurance provided -conservative measures recommended including avoiding triggers and getting adequate hydration and rest -follow up if symptoms worsen

## 2022-08-12 NOTE — Progress Notes (Signed)
    SUBJECTIVE:   CHIEF COMPLAINT / HPI:   Patient presents with non-productive, congestion, rhinitis, headache, wheezing and temperature of 99.5 at its highest. Has been using her albuterol inhaler twice a day during these symptoms. Symptoms started about 5 days ago but says that her wheezing has been ongoing for a little over a week. Sick contacts include husband who tested negative for COVID. Husband is better now. Received 3 COVID vaccinations. Symptoms get worse when she is outside or when it gets hot in the house. Has been taking allegra daily over the past week, says that zyrtec does not help her.   OBJECTIVE:   BP 115/74   Pulse 75   Temp (!) 97.5 F (36.4 C)   Ht '5\' 7"'$  (1.702 m)   Wt 170 lb (77.1 kg)   LMP 08/05/2022   SpO2 98%   BMI 26.63 kg/m   General: Patient well-appearing, in no acute distress. HEENT: presence of mild anterior cervical LAD, normal buccal mucosa CV: RRR, no murmurs or gallops auscultated Resp: CTAB, no wheezing, rales or rhonchi noted  ASSESSMENT/PLAN:   Allergic rhinitis -seems most consistent with allergies, prescribed flonase  -reassurance provided -conservative measures recommended including avoiding triggers and getting adequate hydration and rest -follow up if symptoms worsen      Beth Guiles Larae Grooms, DO [place]

## 2022-08-12 NOTE — Patient Instructions (Addendum)
It was great seeing you today!  Today we discussed your symptoms, I am sorry that you are not feeling your best. I think this is due to allergies, I have prescribed flonase. This is a nasal spray, please spray twice in each nostrils daily which should improve your symptoms. Make sure to get plenty of rest and stay hydrated. Try to avoid any triggers.   Please follow up at your next scheduled appointment, if anything arises between now and then, please don't hesitate to contact our office.   Thank you for allowing [place] to be a part of your medical care!  Thank you, Dr. Larae Grooms

## 2022-08-13 ENCOUNTER — Other Ambulatory Visit: Payer: Self-pay | Admitting: Psychiatry

## 2022-08-13 DIAGNOSIS — F315 Bipolar disorder, current episode depressed, severe, with psychotic features: Secondary | ICD-10-CM

## 2022-08-13 DIAGNOSIS — F411 Generalized anxiety disorder: Secondary | ICD-10-CM

## 2022-08-13 DIAGNOSIS — F4310 Post-traumatic stress disorder, unspecified: Secondary | ICD-10-CM

## 2022-08-19 ENCOUNTER — Other Ambulatory Visit: Payer: Self-pay | Admitting: Psychiatry

## 2022-08-19 DIAGNOSIS — F5105 Insomnia due to other mental disorder: Secondary | ICD-10-CM

## 2022-08-28 ENCOUNTER — Other Ambulatory Visit: Payer: Self-pay | Admitting: Psychiatry

## 2022-08-28 DIAGNOSIS — F411 Generalized anxiety disorder: Secondary | ICD-10-CM

## 2022-08-28 DIAGNOSIS — F4310 Post-traumatic stress disorder, unspecified: Secondary | ICD-10-CM

## 2022-09-02 ENCOUNTER — Encounter: Payer: Self-pay | Admitting: Psychiatry

## 2022-09-02 ENCOUNTER — Ambulatory Visit (INDEPENDENT_AMBULATORY_CARE_PROVIDER_SITE_OTHER): Payer: Medicare HMO | Admitting: Psychiatry

## 2022-09-02 DIAGNOSIS — F315 Bipolar disorder, current episode depressed, severe, with psychotic features: Secondary | ICD-10-CM

## 2022-09-02 MED ORDER — LURASIDONE HCL 80 MG PO TABS: 80.0000 mg | ORAL_TABLET | Freq: Every day | ORAL | 1 refills | Status: DC

## 2022-09-02 MED ORDER — LAMOTRIGINE 150 MG PO TABS: 150.0000 mg | ORAL_TABLET | Freq: Every day | ORAL | 0 refills | Status: DC
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
## 2022-09-11 ENCOUNTER — Other Ambulatory Visit: Payer: Self-pay | Admitting: Psychiatry

## 2022-09-18 ENCOUNTER — Other Ambulatory Visit: Payer: Self-pay | Admitting: Psychiatry

## 2022-09-18 DIAGNOSIS — F5105 Insomnia due to other mental disorder: Secondary | ICD-10-CM

## 2022-09-24 ENCOUNTER — Institutional Professional Consult (permissible substitution): Payer: Medicare HMO | Admitting: Internal Medicine

## 2022-09-26 ENCOUNTER — Other Ambulatory Visit: Payer: Self-pay | Admitting: Psychiatry

## 2022-09-26 DIAGNOSIS — F515 Nightmare disorder: Secondary | ICD-10-CM

## 2022-09-26 DIAGNOSIS — G2581 Restless legs syndrome: Secondary | ICD-10-CM

## 2022-09-26 DIAGNOSIS — F4310 Post-traumatic stress disorder, unspecified: Secondary | ICD-10-CM

## 2022-09-30 ENCOUNTER — Other Ambulatory Visit: Payer: Self-pay | Admitting: Psychiatry

## 2022-09-30 DIAGNOSIS — F411 Generalized anxiety disorder: Secondary | ICD-10-CM

## 2022-09-30 DIAGNOSIS — F4310 Post-traumatic stress disorder, unspecified: Secondary | ICD-10-CM

## 2022-10-06 ENCOUNTER — Ambulatory Visit: Payer: Medicare HMO | Admitting: Nurse Practitioner

## 2022-10-08 ENCOUNTER — Encounter: Payer: Self-pay | Admitting: Psychiatry

## 2022-10-08 ENCOUNTER — Ambulatory Visit (INDEPENDENT_AMBULATORY_CARE_PROVIDER_SITE_OTHER): Payer: Medicare HMO | Admitting: Psychiatry

## 2022-10-08 DIAGNOSIS — F5105 Insomnia due to other mental disorder: Secondary | ICD-10-CM | POA: Diagnosis not present

## 2022-10-08 DIAGNOSIS — F411 Generalized anxiety disorder: Secondary | ICD-10-CM | POA: Diagnosis not present

## 2022-10-08 DIAGNOSIS — F515 Nightmare disorder: Secondary | ICD-10-CM

## 2022-10-08 DIAGNOSIS — F4312 Post-traumatic stress disorder, chronic: Secondary | ICD-10-CM

## 2022-10-08 DIAGNOSIS — F99 Mental disorder, not otherwise specified: Secondary | ICD-10-CM | POA: Diagnosis not present

## 2022-10-08 DIAGNOSIS — F315 Bipolar disorder, current episode depressed, severe, with psychotic features: Secondary | ICD-10-CM

## 2022-10-08 DIAGNOSIS — F4310 Post-traumatic stress disorder, unspecified: Secondary | ICD-10-CM

## 2022-10-08 MED ORDER — LURASIDONE HCL 80 MG PO TABS: 80.0000 mg | ORAL_TABLET | Freq: Every day | ORAL | 1 refills | Status: DC

## 2022-10-08 MED ORDER — LAMOTRIGINE 150 MG PO TABS: 150.0000 mg | ORAL_TABLET | Freq: Every day | ORAL | 0 refills | Status: DC
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
## 2022-10-13 ENCOUNTER — Institutional Professional Consult (permissible substitution): Payer: Medicare HMO | Admitting: Pulmonary Disease

## 2022-10-15 ENCOUNTER — Ambulatory Visit (INDEPENDENT_AMBULATORY_CARE_PROVIDER_SITE_OTHER): Payer: Medicare HMO | Admitting: Nurse Practitioner

## 2022-10-15 ENCOUNTER — Encounter: Payer: Self-pay | Admitting: Nurse Practitioner

## 2022-10-15 VITALS — BP 112/70 | HR 75 | Ht 67.0 in | Wt 168.0 lb

## 2022-10-15 DIAGNOSIS — Z9189 Other specified personal risk factors, not elsewhere classified: Secondary | ICD-10-CM

## 2022-10-15 DIAGNOSIS — N920 Excessive and frequent menstruation with regular cycle: Secondary | ICD-10-CM | POA: Diagnosis not present

## 2022-10-15 DIAGNOSIS — A609 Anogenital herpesviral infection, unspecified: Secondary | ICD-10-CM

## 2022-10-15 DIAGNOSIS — N951 Menopausal and female climacteric states: Secondary | ICD-10-CM

## 2022-10-15 DIAGNOSIS — Z01419 Encounter for gynecological examination (general) (routine) without abnormal findings: Secondary | ICD-10-CM

## 2022-10-15 MED ORDER — VALACYCLOVIR HCL 500 MG PO TABS: ORAL_TABLET | ORAL | 1 refills | Status: DC

## 2022-10-15 MED ORDER — NORETHINDRONE 0.35 MG PO TABS: 1.0000 | ORAL_TABLET | Freq: Every day | ORAL | 3 refills | Status: DC

## 2022-10-15 NOTE — Progress Notes (Signed)
Beth [name] Apr 18, 1976 [id number]   History:  46 y.o. G0 presents for breast and pelvic exam. Cycles have started to become more painful and heavy the last year or so. Still occur most months but are not quite as regular. Mild menopausal symptoms. Normal pap and mammogram history. History of bipolar disorder, HSV - occasional outbreaks.  Gynecologic History Patient's last menstrual period was 09/17/2022 (approximate). Period Cycle (Days):  (28-30) Period Duration (Days): 3-7 Menstrual Flow: Heavy Menstrual Control: Tampon, Maxi pad Dysmenorrhea: (!) Moderate (mod to severe) Dysmenorrhea Symptoms: Nausea, Cramping Contraception/Family planning: none Sexually active: Yes  Health Maintenance Last Pap: 09/18/2021. Results were: Normal neg HPV Last mammogram: 07/24/2021. Results were: Normal Last colonoscopy: 06/19/2022. Results were: Tubular adenoma, 7-year recall Last Dexa: Not indicated  Past medical history, past surgical history, family history and social history were all reviewed and documented in the EPIC chart. Married.  ROS:  A ROS was performed and pertinent positives and negatives are included.  Exam:  Vitals:   10/15/22 1327  BP: 112/70  Pulse: 75  SpO2: 96%  Weight: 168 lb (76.2 kg)  Height: '5\' 7"'$  (1.702 m)    Body mass index is 26.31 kg/m.  General appearance:  Normal Thyroid:  Symmetrical, normal in size, without palpable masses or nodularity. Respiratory  Auscultation:  Clear without wheezing or rhonchi Cardiovascular  Auscultation:  Regular rate, without rubs, murmurs or gallops  Edema/varicosities:  Not grossly evident Abdominal  Soft,nontender, without masses, guarding or rebound.  Liver/spleen:  No organomegaly noted  Hernia:  None appreciated  Skin  Inspection:  Grossly normal Breasts: Examined lying and sitting.   Right: Without masses, retractions, nipple discharge or axillary adenopathy.   Left: Without masses, retractions, nipple discharge  or axillary adenopathy. Genitourinary   Inguinal/mons:  Normal without inguinal adenopathy  External genitalia:  Normal appearing vulva with no masses, tenderness, or lesions  BUS/Urethra/Skene's glands:  Normal  Vagina:  Normal appearing with normal color and discharge, no lesions  Cervix:  Normal appearing without discharge or lesions  Uterus:  Normal in size, shape and contour.  Midline and mobile, nontender  Adnexa/parametria:     Rt: Normal in size, without masses or tenderness.   Lt: Normal in size, without masses or tenderness.  Anus and perineum: Normal  Digital rectal exam: Deferred  Patient informed chaperone available to be present for breast and pelvic exam. Patient has requested no chaperone to be present. Patient has been advised what will be completed during breast and pelvic exam.   Assessment/Plan:  46 y.o. G0 for breast and pelvic exam.   Encounter for breast and pelvic examination - Education provided on SBEs, importance of preventative screenings, current guidelines, high calcium diet, regular exercise, and multivitamin daily. Labs with PCP.   HSV (herpes simplex virus) anogenital infection - Plan: valACYclovir (VALTREX) 500 MG tablet for 3-5 days at first sign of outbreak. Outbreaks occasionally, prefers to take as needed. Refill provided.   Perimenopausal - Plan: norethindrone (ORTHO MICRONOR) 0.35 MG tablet daily. Discussed perimenopause and what to expect.   Menorrhagia with regular cycle - Plan: norethindrone (ORTHO MICRONOR) 0.35 MG tablet daily. Aware of proper use.   Screening for cervical cancer - Normal Pap history.Will repeat at 5-year interval per guidelines.   Screening for breast cancer - Normal mammogram history.  Continue annual screenings.  Normal breast exam today.  Screening for colon cancer - 06/2022 colonoscopy. Will repeat at 7-year interval per GI recommendation.   Return in 1 year  for annual.      Tamela Gammon DNP, 1:51 PM  10/15/2022'

## 2022-10-17 ENCOUNTER — Telehealth: Payer: Self-pay | Admitting: *Deleted

## 2022-10-17 MED ORDER — TRAMADOL HCL 50 MG PO TABS: 50.0000 mg | ORAL_TABLET | Freq: Four times a day (QID) | ORAL | 0 refills | Status: DC | PRN

## 2022-10-17 NOTE — Telephone Encounter (Signed)
Patient called saw Tiffany on 10/15/22 for annual exam, was prescribed OthoMiconor 0.35 mg tablet. Reports Tiffany told her to start on day 1 of cycle, cycle has not started yet,however she is having terrible cramps, tried ibuprofen, tylenol,heating pad and not relief. She expects her cycle to start in the next day or so. She is asking for recommendations about what to do for the pain? Please advise

## 2022-10-17 NOTE — Telephone Encounter (Signed)
Patient infomred with Dr.Lavoie response "buprofen 800 mg PO every 8 hours regularly.  If she took the Ibuprofen at that regular dosage already, we can send Tramadol 50 mg 1 tab PO every 6 hours PRN.  #20 No refill. "  She reports taking ibuprofen 800 mg regularly , she would like the tramadol '50mg'$  tablet dose Rx.   Rx called into [facility] per patient request. I spoke with pharmacist when calledl in.

## 2022-10-20 ENCOUNTER — Other Ambulatory Visit: Payer: Self-pay | Admitting: Psychiatry

## 2022-10-20 DIAGNOSIS — F99 Mental disorder, not otherwise specified: Secondary | ICD-10-CM

## 2022-10-20 NOTE — Telephone Encounter (Signed)
Just FYI please see below

## 2022-10-25 ENCOUNTER — Other Ambulatory Visit: Payer: Self-pay | Admitting: Psychiatry

## 2022-10-25 DIAGNOSIS — F315 Bipolar disorder, current episode depressed, severe, with psychotic features: Secondary | ICD-10-CM

## 2022-10-30 ENCOUNTER — Other Ambulatory Visit: Payer: Self-pay | Admitting: Psychiatry

## 2022-10-30 DIAGNOSIS — F4310 Post-traumatic stress disorder, unspecified: Secondary | ICD-10-CM

## 2022-10-30 DIAGNOSIS — H524 Presbyopia: Secondary | ICD-10-CM | POA: Diagnosis not present

## 2022-10-30 DIAGNOSIS — F411 Generalized anxiety disorder: Secondary | ICD-10-CM

## 2022-10-30 DIAGNOSIS — Z0100 Encounter for examination of eyes and vision without abnormal findings: Secondary | ICD-10-CM | POA: Diagnosis not present

## 2022-10-30 NOTE — Telephone Encounter (Signed)
Filled 10/02/22

## 2022-11-13 ENCOUNTER — Other Ambulatory Visit: Payer: Self-pay | Admitting: Psychiatry

## 2022-11-13 DIAGNOSIS — F315 Bipolar disorder, current episode depressed, severe, with psychotic features: Secondary | ICD-10-CM

## 2022-11-16 ENCOUNTER — Other Ambulatory Visit: Payer: Self-pay | Admitting: Family Medicine

## 2022-11-19 ENCOUNTER — Encounter: Payer: Self-pay | Admitting: Nurse Practitioner

## 2022-11-20 ENCOUNTER — Encounter: Payer: Self-pay | Admitting: Psychiatry

## 2022-11-20 ENCOUNTER — Ambulatory Visit (INDEPENDENT_AMBULATORY_CARE_PROVIDER_SITE_OTHER): Payer: Medicare HMO | Admitting: Psychiatry

## 2022-11-20 DIAGNOSIS — F4312 Post-traumatic stress disorder, chronic: Secondary | ICD-10-CM | POA: Diagnosis not present

## 2022-11-20 DIAGNOSIS — F411 Generalized anxiety disorder: Secondary | ICD-10-CM | POA: Diagnosis not present

## 2022-11-20 DIAGNOSIS — F315 Bipolar disorder, current episode depressed, severe, with psychotic features: Secondary | ICD-10-CM | POA: Diagnosis not present

## 2022-11-20 DIAGNOSIS — F515 Nightmare disorder: Secondary | ICD-10-CM

## 2022-11-20 DIAGNOSIS — F5105 Insomnia due to other mental disorder: Secondary | ICD-10-CM | POA: Diagnosis not present

## 2022-11-20 DIAGNOSIS — F4310 Post-traumatic stress disorder, unspecified: Secondary | ICD-10-CM

## 2022-11-20 DIAGNOSIS — F99 Mental disorder, not otherwise specified: Secondary | ICD-10-CM

## 2022-11-20 DIAGNOSIS — G2581 Restless legs syndrome: Secondary | ICD-10-CM

## 2022-11-20 DIAGNOSIS — Z87828 Personal history of other (healed) physical injury and trauma: Secondary | ICD-10-CM

## 2022-11-20 MED ORDER — DIVALPROEX SODIUM ER 500 MG PO TB24: ORAL_TABLET | ORAL | 1 refills | Status: DC

## 2022-11-20 MED ORDER — LORAZEPAM 1 MG PO TABS: 1.0000 mg | ORAL_TABLET | Freq: Four times a day (QID) | ORAL | 3 refills | Status: DC

## 2022-11-20 MED ORDER — LAMOTRIGINE 150 MG PO TABS: 150.0000 mg | ORAL_TABLET | Freq: Every day | ORAL | 0 refills | Status: DC

## 2022-11-20 MED ORDER — GABAPENTIN 400 MG PO CAPS: ORAL_CAPSULE | ORAL | 0 refills | Status: DC

## 2022-11-20 MED ORDER — SERTRALINE HCL 100 MG PO TABS: 100.0000 mg | ORAL_TABLET | Freq: Every day | ORAL | 0 refills | Status: DC

## 2022-11-20 MED ORDER — ESZOPICLONE 3 MG PO TABS: 3.0000 mg | ORAL_TABLET | Freq: Every day | ORAL | 3 refills | Status: DC

## 2022-11-20 NOTE — Progress Notes (Signed)
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 nightly. High dose necessary to control RLS Continue sertraline 100 mg daily. Consider decrease DT cycling. continue Lunesta 3 for longer duration..  Discussed side effects including amnesia.  Need counseling and started Sanjuana Kava at [facility].  . Discussed safety plan at length with patient.  Advised patient to contact office with any worsening signs and symptoms.  Instructed patient to go to the [facility] emergency room for evaluation if experiencing any acute safety concerns, to include suicidal intent.  Discussed potential metabolic side effects associated with atypical antipsychotics, as well as potential risk for movement side effects. Advised pt to contact office if movement side effects occur.   No  med changes today: She agrees with the plan  Follow-up in 1- 2 mos  Lynder Parents MD, DFAPA Please see After Visit Summary for patient specific instructions.  Future Appointments  Date Time Provider [place]  01/28/2023  1:00 PM Cottle,  Billey Co., MD CP-CP None       No orders of the defined types were placed in this encounter.    -------------------------------

## 2022-11-20 NOTE — Telephone Encounter (Signed)
Has an appt with Dr. Clovis Pu today

## 2022-12-01 ENCOUNTER — Telehealth: Payer: Self-pay | Admitting: Psychiatry

## 2022-12-01 NOTE — Telephone Encounter (Signed)
Pharmacy lvm at 11:33 stating that need prescription clarification. Code [id number] Ph: [phone]

## 2022-12-02 NOTE — Telephone Encounter (Signed)
Pharmacy said the issue had been resolved and that they didn't need anything from [place].

## 2023-01-08 ENCOUNTER — Other Ambulatory Visit: Payer: Self-pay | Admitting: Psychiatry

## 2023-01-08 DIAGNOSIS — F411 Generalized anxiety disorder: Secondary | ICD-10-CM

## 2023-01-08 DIAGNOSIS — F4310 Post-traumatic stress disorder, unspecified: Secondary | ICD-10-CM

## 2023-01-08 DIAGNOSIS — F315 Bipolar disorder, current episode depressed, severe, with psychotic features: Secondary | ICD-10-CM

## 2023-01-13 ENCOUNTER — Telehealth: Payer: Self-pay | Admitting: Psychiatry

## 2023-01-13 DIAGNOSIS — Z6825 Body mass index (BMI) 25.0-25.9, adult: Secondary | ICD-10-CM | POA: Diagnosis not present

## 2023-01-13 DIAGNOSIS — J0100 Acute maxillary sinusitis, unspecified: Secondary | ICD-10-CM | POA: Diagnosis not present

## 2023-01-13 DIAGNOSIS — R062 Wheezing: Secondary | ICD-10-CM | POA: Diagnosis not present

## 2023-01-13 DIAGNOSIS — R0789 Other chest pain: Secondary | ICD-10-CM | POA: Diagnosis not present

## 2023-01-13 NOTE — Telephone Encounter (Signed)
Pt lvm that she is cycling a lot. She is on 80 mg of latuda. She wants to increase to 100 mg. Her next appt is not until march 21 st. Please give her a call at [phone]

## 2023-01-13 NOTE — Telephone Encounter (Signed)
Called the # listed and the mailbox is full.

## 2023-01-14 NOTE — Telephone Encounter (Signed)
Reached patient. She said she is taking all meds as prescribed. She said she is just manic "big time" - yelling, screaming, "wild tempers." She is getting to sleep with Lunesta but wakes up around 4 AM most of the time and can't get back to sleep.  Meds:  Cyclobenzapine 10 mg  Divalproex 500 ER Doxazosin 6 mg Lunesta 3 mg Gabapentin 400 mg, 1 BID, 2 QPM Lamictal 150 Lorazepam 1 QID Latuda 80 with supper Requip 2 5 pm and QHS Requip 4 2 QHS Sertraline 100 mg qd   Last visit 12/7, no med changes at that time.

## 2023-01-15 NOTE — Telephone Encounter (Signed)
Patient notified of recommendations. She knows to consume at least 350 calories with Latuda and said that was not a problem for her.

## 2023-01-15 NOTE — Telephone Encounter (Signed)
To treat the mania temporarily increase the Latuda to 1-1/2 tablets daily preferably in the evening and make sure it is with at least 350 cal.  It will also help the mania resolve more quickly if she cuts the Zoloft dose in half.  Call us back next week and let us know how it is going.

## 2023-01-20 ENCOUNTER — Ambulatory Visit (INDEPENDENT_AMBULATORY_CARE_PROVIDER_SITE_OTHER): Payer: Medicare HMO | Admitting: Family Medicine

## 2023-01-20 VITALS — BP 122/74 | HR 76 | Temp 97.9°F | Ht 67.0 in | Wt 170.0 lb

## 2023-01-20 DIAGNOSIS — R300 Dysuria: Secondary | ICD-10-CM | POA: Diagnosis not present

## 2023-01-20 DIAGNOSIS — N390 Urinary tract infection, site not specified: Secondary | ICD-10-CM

## 2023-01-20 LAB — POCT URINALYSIS DIP (MANUAL ENTRY)
Bilirubin, UA: NEGATIVE
Blood, UA: NEGATIVE
Glucose, UA: NEGATIVE mg/dL
Ketones, POC UA: NEGATIVE mg/dL
Leukocytes, UA: NEGATIVE
Nitrite, UA: NEGATIVE
Protein Ur, POC: NEGATIVE mg/dL
Spec Grav, UA: 1.02 (ref 1.010–1.025)
Urobilinogen, UA: 0.2 E.U./dL
pH, UA: 6.5 (ref 5.0–8.0)

## 2023-01-20 NOTE — Patient Instructions (Signed)
It was great seeing you today!  Today we discussed your urinary symptoms, I am glad that your symptoms have resolved. If they return then please schedule another appointment to be seen. Your urine was completely normal.   Please follow up at your next scheduled appointment in 3 months, if anything arises between now and then, please don't hesitate to contact our office.   Thank you for allowing [place] to be a part of your medical care!  Thank you, Dr. Larae Grooms  Also a reminder of our clinic's no-show policy. Please make sure to arrive at least 15 minutes prior to your scheduled appointment time. Please try to cancel before 24 hours if you are not able to make it. If you no-show for 2 appointments then you will be receiving a warning letter. If you no-show after 3 visits, then you may be at risk of being dismissed from our clinic. This is to ensure that everyone is able to be seen in a timely manner. Thank you, we appreciate your assistance with this!

## 2023-01-20 NOTE — Assessment & Plan Note (Addendum)
-  seems to be resolved without any further symptoms -UA negative -follow up as appropriate or if other symptoms arise -follow up in 3 months for physical

## 2023-01-20 NOTE — Progress Notes (Signed)
    SUBJECTIVE:   CHIEF COMPLAINT / HPI:   Patient presents with dysuria for the past few days. Denies fever, chills, vaginal discharge, abdominal or pelvic pain. Denies any other concerns at this time. Does not have a history of recurrent UTIs, only had 1 UTI in the past. Denies any back pain or radiating pain. Denies hematuria. Not experiencing dysuria today, stopped last night.   OBJECTIVE:   BP 122/74   Pulse 76   Temp 97.9 F (36.6 C)   Ht '5\' 7"'$  (1.702 m)   Wt 170 lb (77.1 kg)   LMP 12/15/2022   SpO2 97%   BMI 26.63 kg/m   General: Patient well-appearing, in no acute distress. CV: RRR, no murmurs or gallops auscultated Resp: CTAB, no wheezing, rales or rhonchi noted  Abdomen: soft, nontender, nondistended, presence of bowel sounds MSK: negative CVA tenderness bilaterally   ASSESSMENT/PLAN:   Dysuria -seems to be resolved without any further symptoms -UA negative -follow up as appropriate or if other symptoms arise -follow up in 3 months for physical      Jaysean Manville Larae Grooms, [place]

## 2023-01-20 NOTE — Progress Notes (Deleted)
    SUBJECTIVE:   CHIEF COMPLAINT / HPI:   ***  PERTINENT  PMH / PSH: ***  OBJECTIVE:   There were no vitals taken for this visit. ***  General: NAD, pleasant, able to participate in exam Cardiac: RRR, no murmurs. Respiratory: CTAB, normal effort, No wheezes, rales or rhonchi Abdomen: Bowel sounds present, nontender, nondistended, no hepatosplenomegaly. Extremities: no edema or cyanosis. Skin: warm and dry, no rashes noted Neuro: alert, no obvious focal deficits Psych: Normal affect and mood  ASSESSMENT/PLAN:   No problem-specific Assessment & Plan notes found for this encounter.     Dr. Precious Gilding, [place]    {    This will disappear when note is signed, click to select method of visit    :1}

## 2023-01-21 NOTE — Telephone Encounter (Signed)
LVM to RC to see how she is doing with medication adjustments from last week.

## 2023-01-22 NOTE — Telephone Encounter (Signed)
Left a second VM to RC.  

## 2023-01-26 NOTE — Telephone Encounter (Signed)
Left third message.

## 2023-01-28 ENCOUNTER — Ambulatory Visit: Payer: Medicare HMO | Admitting: Psychiatry

## 2023-02-01 DIAGNOSIS — R0602 Shortness of breath: Secondary | ICD-10-CM | POA: Diagnosis not present

## 2023-02-01 DIAGNOSIS — R509 Fever, unspecified: Secondary | ICD-10-CM | POA: Diagnosis not present

## 2023-02-01 DIAGNOSIS — Z6825 Body mass index (BMI) 25.0-25.9, adult: Secondary | ICD-10-CM | POA: Diagnosis not present

## 2023-02-01 DIAGNOSIS — J09X2 Influenza due to identified novel influenza A virus with other respiratory manifestations: Secondary | ICD-10-CM | POA: Diagnosis not present

## 2023-02-03 ENCOUNTER — Telehealth: Payer: Self-pay | Admitting: Psychiatry

## 2023-02-03 NOTE — Telephone Encounter (Signed)
Pt lvm yesterday stating that she is still dealing with anger issues. She would like to go up on the latuda to 120 mg. Please call her at [phone]

## 2023-02-04 NOTE — Telephone Encounter (Signed)
You increased Latuda on 2/1 to 120 mg. I tried multiple times to reach her to see how she was doing and she didn't return messages. She left message today that she is still having anger issues. She is asking to increase dose.  She will also need an earlier RF due to taking 1-1/2 tabs. She said she is taking it with enough calories. No other complaints.   From my message 1/31. Reached patient. She said she is taking all meds as prescribed. She said she is just manic "big time" - yelling, screaming, "wild tempers." She is getting to sleep with Lunesta but wakes up around 4 AM most of the time and can't get back to sleep.   Meds:   Cyclobenzapine 10 mg  Divalproex 500 ER Doxazosin 6 mg Lunesta 3 mg Gabapentin 400 mg, 1 BID, 2 QPM Lamictal 150 Lorazepam 1 QID Latuda 80 with supper Requip 2 5 pm and QHS Requip 4 2 QHS Sertraline 100 mg qd    Last visit 12/7, no med changes at that time.

## 2023-02-04 NOTE — Telephone Encounter (Signed)
She can increase Latuda to 2 of the 80 mg tablets daily but this is not likely sustainable.  It is in the normal range but insurance often will not pay for it but we can try.  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

## 2023-02-05 ENCOUNTER — Telehealth: Payer: Self-pay

## 2023-02-05 DIAGNOSIS — F315 Bipolar disorder, current episode depressed, severe, with psychotic features: Secondary | ICD-10-CM

## 2023-02-05 MED ORDER — LURASIDONE HCL 80 MG PO TABS: ORAL_TABLET | ORAL | 1 refills | Status: DC

## 2023-02-05 NOTE — Telephone Encounter (Signed)
Patient notified of recommendations. Sent in new Rx.

## 2023-02-06 ENCOUNTER — Encounter: Payer: Self-pay | Admitting: Internal Medicine

## 2023-02-06 ENCOUNTER — Ambulatory Visit: Payer: Medicare HMO | Admitting: Internal Medicine

## 2023-02-06 ENCOUNTER — Ambulatory Visit (INDEPENDENT_AMBULATORY_CARE_PROVIDER_SITE_OTHER): Payer: Medicare HMO

## 2023-02-06 VITALS — BP 106/72 | HR 105 | Temp 97.7°F | Ht 67.0 in | Wt 173.2 lb

## 2023-02-06 DIAGNOSIS — J45991 Cough variant asthma: Secondary | ICD-10-CM | POA: Diagnosis not present

## 2023-02-06 DIAGNOSIS — R059 Cough, unspecified: Secondary | ICD-10-CM | POA: Diagnosis not present

## 2023-02-06 DIAGNOSIS — Z8709 Personal history of other diseases of the respiratory system: Secondary | ICD-10-CM

## 2023-02-06 MED ORDER — BUDESONIDE-FORMOTEROL FUMARATE 80-4.5 MCG/ACT IN AERO: INHALATION_SPRAY | RESPIRATORY_TRACT | 12 refills | Status: DC

## 2023-02-06 MED ORDER — ALBUTEROL SULFATE (2.5 MG/3ML) 0.083% IN NEBU: INHALATION_SOLUTION | RESPIRATORY_TRACT | 0 refills | Status: DC

## 2023-02-06 MED ORDER — AZITHROMYCIN 250 MG PO TABS: ORAL_TABLET | ORAL | 0 refills | Status: DC

## 2023-02-06 NOTE — Patient Instructions (Addendum)
Zpak  Plan A = Automatic = Always=    Symbicort 80 Take 2 puffs first thing in am and then another 2 puffs about 12 hours later.    Work on inhaler technique:  relax and gently blow all the way out then take a nice smooth full deep breath back in, triggering the inhaler at same time you start breathing in.  Hold breath in for at least  5 seconds if you can. Blow out symbicort  thru nose. Rinse and gargle with water when done.  If mouth or throat bother you at all,  try brushing teeth/gums/tongue with arm and hammer toothpaste/ make a slurry and gargle and spit out.      Plan B = Backup (to supplement plan A, not to replace it) Only use your albuterol inhaler as a rescue medication to be used if you can't catch your breath by resting or doing a relaxed purse lip breathing pattern.  - The less you use it, the better it will work when you need it. - Ok to use the inhaler up to 2 puffs  every 4 hours if you must but call for appointment if use goes up over your usual need - Don't leave home without it !!  (think of it like the spare tire for your car)   Plan C = Crisis (instead of Plan B but only if Plan B stops working) - only use your albuterol nebulizer if you first try Plan B and it fails to help > ok to use the nebulizer up to every 4 hours but if start needing it regularly call for immediate appointment   Also  Ok to try albuterol 15 min before an activity (on alternating days)  that you know would usually make you short of breath and see if it makes any difference and if makes none then don't take albuterol after activity unless you can't catch your breath as this means it's the resting that helps, not the albuterol.    Try prilosec otc '20mg'$   Take 30-60 min before first meal of the day and Pepcid ac (famotidine) 20 mg one @  bedtime until cough is completely gone for at least a week without the need for cough suppression  GERD (REFLUX)  is an extremely common cause of respiratory symptoms  just like yours , many times with no obvious heartburn at all.    It can be treated with medication, but also with lifestyle changes including elevation of the head of your bed (ideally with 6 -8inch blocks under the headboard of your bed),  Smoking cessation, avoidance of late meals, excessive alcohol, and avoid fatty foods, chocolate, peppermint, colas, red wine, and acidic juices such as orange juice.  NO MINT OR MENTHOL PRODUCTS SO NO COUGH DROPS  USE SUGARLESS CANDY INSTEAD (Jolley ranchers or Stover's or Life Savers) or even ice chips will also do - the key is to swallow to prevent all throat clearing. NO OIL BASED VITAMINS - use powdered substitutes.  Avoid fish oil when coughing.   Please remember to go to the  x-ray department  for your tests - we will call you with the results when they are available    Please schedule a follow up office visit in 2 weeks, sooner if needed  with all medications /inhalers/ solutions in hand so we can verify exactly what you are taking. This includes all medications from all doctors and over the counters

## 2023-02-06 NOTE — Progress Notes (Unsigned)
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 Asthma?       Had excellent ex tol and no need for any inhalers prior 03/2022 cxr at [facility] cone neg but rx as CAP and nebulizer turned things and never completely recovered maybe 75% never tried then acutely ill 01/30/23 severe cough > yellow mucus chest tightness sob  01/31/23 [facility] pos type A flu rx tamiflu > overall 50% but no neb but continue to use inhaler.   History of Present Illness  02/06/2023  Pulmonary/ 1st office eval/Delmas Faucett  Chief Complaint  Patient presents with   Consult    Pneumonia 04/2022.  Influenza 02/01/23. Chest tightness, cough and wheeze.  Sx resolved by next day.  Dyspnea:  with housecleaning uses saba  seems to help -not working out yet  Cough: better now but not gone > am only is still slt  yellow  Sleep: disrupted since 2/16 at least once a night / mucinex  SABA use: much less now   No obvious day to day or daytime pattern/variability or assoc excess/ purulent sputum or mucus plugs or hemoptysis or cp or overt sinus or hb symptoms.    Also denies any obvious fluctuation of symptoms with weather or environmental changes or other aggravating or alleviating factors except as outlined above   No unusual exposure hx or h/o childhood pna/ asthma or knowledge of premature birth.  Current Allergies, Complete Past Medical History, Past Surgical History, Family History, and Social History were reviewed in [employer] record.  ROS  The following are not active complaints unless bolded Hoarseness, sore throat, dysphagia, dental problems, itching, sneezing,  nasal congestion or discharge of excess mucus or purulent secretions, ear ache,   fever, chills, sweats, unintended wt loss or wt gain, classically pleuritic or  exertional cp,  orthopnea pnd or arm/hand swelling  or leg swelling, presyncope, palpitations, abdominal pain, anorexia, nausea, vomiting, diarrhea  or change in bowel habits or change in bladder habits, change in stools or change in urine, dysuria, hematuria,  rash, arthralgias, visual complaints, headache, numbness, weakness or ataxia or problems with walking or coordination,  change in mood or  memory.           Past Medical History:  Diagnosis Date   Anxiety    on meds   Bipolar disorder ([place])    Depression    on meds   HSV (herpes simplex virus) anogenital infection 05/2018   RLS (restless legs syndrome)    Seasonal allergies     Outpatient Medications Prior to Visit  Medication Sig Dispense Refill   albuterol (PROVENTIL) (2.5 MG/3ML) 0.083% nebulizer solution USE THREE MILLILITERS VIA NEBULIZATION BY MOUTH EVERY 6 HOURS AS NEEDED FOR WHEEZING OR SHORTNESS OF BREATH 75 mL 0   albuterol (VENTOLIN HFA) 108 (90 Base) MCG/ACT inhaler INHALE ONE TO TWO PUFFS BY MOUTH EVERY 6 HOURS AS NEEDED FOR WHEEZING OR SHORTNESS OF BREATH 8.5 g 3   cyclobenzaprine (FLEXERIL) 10 MG tablet TAKE 2 TABLETS AT BEDTIME 180 tablet 10   divalproex (DEPAKOTE ER) 500 MG 24 hr tablet TAKE TWO TABLETS BY MOUTH EVERY NIGHT AT BEDTIME 180 tablet 1   doxazosin (CARDURA) 4 MG tablet TAKE 1 AND 1/2 TABLETS AT BEDTIME 135 tablet 10  Eszopiclone 3 MG TABS Take 1 tablet (3 mg total) by mouth at bedtime. Take immediately before bedtime 30 tablet 3   fluticasone (FLONASE) 50 MCG/ACT nasal spray Place 2 sprays into both nostrils daily. 16 g 6   gabapentin (NEURONTIN) 400 MG capsule TAKE 1 CAPSULE TWICE DAILY  AND TAKE 2 CAPSULES IN THE EVENING 360 capsule 0   lamoTRIgine (LAMICTAL) 150 MG tablet Take 1 tablet (150 mg total) by mouth daily. 90 tablet 0   LORazepam (ATIVAN) 1 MG tablet Take 1 tablet (1 mg total) by mouth 4 (four) times daily. 120 tablet 3   lurasidone (LATUDA) 80 MG TABS tablet TAKE 2 TABLETS DAILY WITH  SUPPER 180 tablet 1   norethindrone (ORTHO MICRONOR) 0.35 MG tablet Take 1 tablet (0.35 mg total) by mouth daily. 84 tablet 3   rOPINIRole (REQUIP) 2 MG tablet TAKE 1 TABLET AT 5 PM. TAKE IN ADDITION TO '8MG'$  AT BEDTIME. 90 tablet 10   rOPINIRole (REQUIP) 4 MG tablet TAKE 2 TABLETS AT BEDTIME 180 tablet 10   sertraline (ZOLOFT) 100 MG tablet TAKE 1 TABLET EVERY DAY 90 tablet 0   traMADol (ULTRAM) 50 MG tablet Take 1 tablet (50 mg total) by mouth every 6 (six) hours as needed. 20 tablet 0   valACYclovir (VALTREX) 500 MG tablet Take one tablet by mouth twice daily for 3-5 days then daily as needed. 90 tablet 1   Facility-Administered Medications Prior to Visit  Medication Dose Route Frequency Provider Last Rate Last Admin   0.9 %  sodium chloride infusion  500 mL Intravenous Once Daryel November, MD         Objective:     BP 106/72 (BP Location: Left Arm, Patient Position: Sitting, Cuff Size: Normal)   Pulse (!) 105   Temp 97.7 F (36.5 C) (Oral)   Ht '5\' 7"'$  (1.702 m)   Wt 173 lb 3.2 oz (78.6 kg)   LMP 12/15/2022   SpO2 96%   BMI 27.13 kg/m   SpO2: 96 %  Pleasant amb wf nad   HEENT : Oropharynx  clear      Nasal turbinates nl    NECK :  without  apparent JVD/ palpable Nodes/TM    LUNGS: no acc muscle use,  Nl contour chest which is clear to A and P bilaterally without cough on insp or exp maneuvers   CV:  RRR  no s3 or murmur or increase in P2, and no edema   ABD:  soft and nontender     MS:  Nl gait/ ext warm without deformities Or obvious joint restrictions  calf tenderness, cyanosis or clubbing    SKIN: warm and dry without lesions    NEURO:  alert, approp, nl sensorium with  no motor or cerebellar deficits apparent.   CXR PA and Lateral:   02/06/2023 :    I personally reviewed images and impression is as follows:     No acute findings      Assessment   No problem-specific Assessment & Plan notes found for this encounter.     Christinia Gully,  MD 02/06/2023

## 2023-02-07 NOTE — Assessment & Plan Note (Signed)
Onset 04/2022 on a background of spring time rhinitis all her life  - 02/06/2023  After extensive coaching inhaler device,  effectiveness =    75% with hfa > try symb 80 2bid and max gerd rx   DDX of  difficult airways management almost all start with A and  include Adherence, Ace Inhibitors, Acid Reflux, Active Sinus Disease, Alpha 1 Antitripsin deficiency, Anxiety masquerading as Airways dz,  ABPA,  Allergy(esp in young), Aspiration (esp in elderly), Adverse effects of meds,  Active smoking or vaping, A bunch of PE's (a small clot burden can't cause this syndrome unless there is already severe underlying pulm or vascular dz with poor reserve) plus two Bs  = Bronchiectasis and Beta blocker use..and one C= CHF  Adherence is always the initial "prime suspect" and is a multilayered concern that requires a "trust but verify" approach in every patient - starting with knowing how to use medications, especially inhalers, correctly, keeping up with refills and understanding the fundamental difference between maintenance and prns vs those medications only taken for a very short course and then stopped and not refilled. - see hfa teaching - return with all meds in hand using a trust but verify approach to confirm accurate Medication  Reconciliation The principal here is that until we are certain that the  patients are doing what we've asked, it makes no sense to ask them to do more.   ? Acid (or non-acid) GERD > always difficult to exclude as up to 75% of pts in some series report no assoc GI/ Heartburn symptoms> rec max (24h)  acid suppression and diet restrictions/ reviewed and instructions given in writing.  -  Of the three most common causes of  Sub-acute / recurrent or chronic cough, only one (GERD)  can actually contribute to/ trigger  the other two (asthma and post nasal drip syndrome)  and perpetuate the cylce of cough.  While not intuitively obvious, many patients with chronic low grade reflux do not cough  until there is a primary insult that disturbs the protective epithelial barrier and exposes sensitive nerve endings.   This is typically viral but can due to PNDS and  either may apply here.   The point is that once this occurs, it is difficult to eliminate the cycle  using anything but a maximally effective acid suppression regimen at least in the short run, accompanied by an appropriate diet to address non acid GERD and control / eliminate the cough itself for at least 3 days with hard rock candy to reduce urge to clear throat  ? Active sinusitis/rhinitis/ bronchitis > zpak only for now   ? Adverse drug effects > none of the usual suspects listed.   F/u in 2 weeks - call sooner prn   Total time for H and P, chart review, counseling, reviewing hfa/neb device(s) and generating customized AVS unique to this office visit / same day charting > 45 min with pt new to me

## 2023-02-09 ENCOUNTER — Telehealth: Payer: Self-pay

## 2023-02-09 NOTE — Telephone Encounter (Signed)
Patient called.  She thought Dr. Melvyn Novas was going to call in prednisone along with the abt.  Patient is currently taking the Z-Pak but is still SOB and would like prednisone called in. Please advise.   [place] [facility] [place], [place]

## 2023-02-09 NOTE — Telephone Encounter (Signed)
Patient calls nurse line requesting a prescription for cough.   She reports she tested [facility] for Flu A on [facility]. She reports she was given Tamiflu and Prednisone. She reports she feels "somewhat" better, however the cough is keeping her up at night.   She reports symptoms started one week from Saturday. She denies any SOB, fevers, nausea, vomiting or diarrhea.   Encouraged warm fluids with honey.   Will forward to PCP for cough medication.

## 2023-02-10 MED ORDER — PREDNISONE 10 MG PO TABS: ORAL_TABLET | ORAL | 0 refills | Status: AC

## 2023-02-10 NOTE — Telephone Encounter (Signed)
Patient returns call to nurse line.   PCP recommendations given to patient.

## 2023-02-10 NOTE — Telephone Encounter (Signed)
Called and spoke with pt letting her know the info per MW and she verbalized understanding. Pt stated that she would like to have prednisone called in so rx has been sent to pharmacy for pt. Nothing further needed.

## 2023-02-10 NOTE — Telephone Encounter (Signed)
If it wasn't on the AVS then I didn't intend to send it in and her lungs were clear on exam and the symbicort has prednisone in it .  If following all the other instructions on the AVS though and not doing better by now ok to rx with Prednisone 10 mg take  4 each am x 2 days,   2 each am x 2 days,  1 each am x 2 days and stop

## 2023-02-13 ENCOUNTER — Telehealth: Payer: Self-pay | Admitting: Psychiatry

## 2023-02-13 ENCOUNTER — Other Ambulatory Visit: Payer: Self-pay | Admitting: Psychiatry

## 2023-02-13 DIAGNOSIS — F5105 Insomnia due to other mental disorder: Secondary | ICD-10-CM

## 2023-02-13 DIAGNOSIS — G2581 Restless legs syndrome: Secondary | ICD-10-CM

## 2023-02-13 NOTE — Telephone Encounter (Signed)
Drop Latuda to 80 mg through weekend and call back next week

## 2023-02-13 NOTE — Telephone Encounter (Signed)
See message from patient. She increased Latuda to 160 mg on 2/22.

## 2023-02-13 NOTE — Telephone Encounter (Signed)
LM on VM per DPR with recommendations.

## 2023-02-13 NOTE — Telephone Encounter (Signed)
Pt LVM @ 10:35a.  She said she has been taking Latuda '160mg'$  and her restless leg is so worse in the evening that she doesn't even want to take the medicine.  She is asking if she can change the 5pm dose of the Ropinirole to help.  Next appt 3/21

## 2023-02-16 NOTE — Progress Notes (Unsigned)
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 Asthma?       Had excellent ex tol and no need for any inhalers prior 03/2022 cxr at [facility] cone neg but rx as CAP and nebulizer turned things and never completely recovered maybe 75% never tried then acutely ill 01/30/23 severe cough > yellow mucus chest tightness sob  01/31/23 minute clinic pos type A flu rx tamiflu > overall 50% but no neb but continue to use inhaler.   History of Present Illness  02/06/2023  Pulmonary/ 1st office eval/Beth [name]  Chief Complaint  Patient presents with   Consult    Pneumonia 04/2022.  Influenza 02/01/23. Chest tightness, cough and wheeze.  Sx resolved by next day.  Dyspnea:  with housecleaning uses saba  seems to help -not working out yet  Cough: better now but not gone > am only is still slt  yellow  Sleep: disrupted since 2/16 at least once a night / mucinex  SABA use: much less now  Rec Zpak  Plan A = Automatic = Always=    Symbicort 80 Take 2 puffs first thing in am and then another 2 puffs about 12 hours later.  Work on inhaler technique: Plan B = Backup (to supplement plan A, not to replace it) Only use your albuterol inhaler as a rescue medication  Plan C = Crisis (instead of Plan B but only if Plan B stops working) - only use your albuterol nebulizer if you first try [name] to try albuterol 15 min before an activity (on alternating days)  that you know would usually make you short of breath Try prilosec otc '20mg'$   Take 30-60 min before first meal of the day and Pepcid ac (famotidine) 20 mg one @  bedtime until cough is completely gone for at least a week without the need for cough suppression GERD diet reviewed, bed blocks rec  Please schedule a follow up office visit in 2 weeks, sooner if needed  with all  medications /inhalers/ solutions in hand     02/17/2023  f/u ov/Beth [name] re: ***   maint on ***  No chief complaint on file.   Dyspnea:  *** Cough: *** Sleeping: *** SABA use: *** 02: *** Covid status:   *** Lung cancer screening :  ***    No obvious day to day or daytime variability or assoc excess/ purulent sputum or mucus plugs or hemoptysis or cp or chest tightness, subjective wheeze or overt sinus or hb symptoms.   *** without nocturnal  or early am exacerbation  of respiratory  c/o's or need for noct saba. Also denies any obvious fluctuation of symptoms with weather or environmental changes or other aggravating or alleviating factors except as outlined above   No unusual exposure hx or h/o childhood pna/ asthma or knowledge of premature birth.  Current Allergies, Complete Past Medical History, Past Surgical History, Family History, and Social History were reviewed in [employer] record.  ROS  The following are not active complaints unless bolded Hoarseness, sore throat, dysphagia, dental problems, itching, sneezing,  nasal congestion or discharge of excess mucus or purulent secretions, ear ache,   fever, chills, sweats, unintended wt loss or wt gain, classically pleuritic or exertional cp,  orthopnea pnd or arm/hand swelling  or leg swelling, presyncope, palpitations, abdominal pain, anorexia, nausea, vomiting, diarrhea  or change in bowel habits or change in bladder habits, change in stools or change in urine, dysuria, hematuria,  rash, arthralgias, visual complaints, headache, numbness, weakness or ataxia or problems with walking or coordination,  change in mood or  memory.        No outpatient medications have been marked as taking for the 02/17/23 encounter (Appointment) with Beth Rockers, MD.          Objective:     Wt Readings from Last 3 Encounters:  02/06/23 173 lb 3.2 oz (78.6 kg)  01/20/23 170 lb (77.1 kg)  10/15/22 168 lb (76.2 kg)       Vital signs reviewed  02/17/2023  - Note at rest 02 sats  ***% on ***   General appearance:    ***          Assessment

## 2023-02-17 ENCOUNTER — Encounter: Payer: Self-pay | Admitting: Internal Medicine

## 2023-02-17 ENCOUNTER — Ambulatory Visit: Payer: Medicare HMO | Admitting: Internal Medicine

## 2023-02-17 VITALS — BP 106/60 | HR 74 | Temp 97.9°F | Ht 67.0 in | Wt 178.0 lb

## 2023-02-17 DIAGNOSIS — J45991 Cough variant asthma: Secondary | ICD-10-CM

## 2023-02-17 MED ORDER — PREDNISONE 10 MG PO TABS: ORAL_TABLET | ORAL | 0 refills | Status: DC

## 2023-02-17 MED ORDER — AMOXICILLIN-POT CLAVULANATE 875-125 MG PO TABS: 1.0000 | ORAL_TABLET | Freq: Two times a day (BID) | ORAL | 0 refills | Status: DC

## 2023-02-17 NOTE — Patient Instructions (Addendum)
Augmentin 875 mg take one pill twice daily  X 10 days - take at breakfast and supper with large glass of water.  It would help reduce the usual side effects (diarrhea and yeast infections) if you ate cultured yogurt at lunch.   Prednisone 10 mg take  4 each am x 2 days,   2 each am x 2 days,  1 each am x 2 days and stop    Work on inhaler technique:  relax and gently blow all the way out then take a nice smooth full deep breath back in, triggering the inhaler at same time you start breathing in.  Hold breath in for at least  5 seconds if you can. Blow out symbicort  thru nose. Rinse and gargle with water when done.  If mouth or throat bother you at all,  try brushing teeth/gums/tongue with arm and hammer toothpaste/ make a slurry and gargle and spit out.   Also  Ok to try albuterol 15 min before an activity (on alternating days)  that you know would usually make you short of breath and see if it makes any difference and if makes none then don't take albuterol after activity unless you can't catch your breath as this means it's the resting that helps, not the albuterol.  Please schedule a follow up office visit in 6 weeks, call sooner if needed

## 2023-02-17 NOTE — Assessment & Plan Note (Addendum)
Onset 04/2022 on a background of spring time rhinitis all her life/ much worse since 12/01/23   - 02/06/2023   try symb 80 2bid and max gerd rx  - 02/17/2023 improved noct symptoms but cc green mucus daytime and doe no better - 02/17/2023   Walked on RA  x  3  lap(s) =  approx 750  ft  @ fast pace, stopped due to end of study/ min sob with lowest 02 sats 96% - 02/17/2023  After extensive coaching inhaler device,  effectiveness =    80% > continue symbicort / gerd rx (NO MINTS)    Added augmentin x 10 d for green mucus and also  >>> also  added 6 day taper off  Prednisone starting at 40 mg per day in case of component of Th-2 driven upper or lower airways inflammation (if cough responds short term only to relapse before return while will on full rx for uacs (as above), then  that would point to allergic rhinitis/ asthma or eos bronchitis as alternative dx)   Each maintenance medication was reviewed in detail including emphasizing most importantly the difference between maintenance and prns and under what circumstances the prns are to be triggered using an action plan format where appropriate.  Total time for H and P, chart review, counseling, reviewing hfa device(s) , directly observing portions of ambulatory 02 saturation study/ and generating customized AVS unique to this office visit / same day charting = 30 min

## 2023-02-18 ENCOUNTER — Encounter: Payer: Self-pay | Admitting: Psychiatry

## 2023-02-18 ENCOUNTER — Ambulatory Visit (INDEPENDENT_AMBULATORY_CARE_PROVIDER_SITE_OTHER): Payer: Medicare HMO | Admitting: Psychiatry

## 2023-02-18 DIAGNOSIS — F99 Mental disorder, not otherwise specified: Secondary | ICD-10-CM | POA: Diagnosis not present

## 2023-02-18 DIAGNOSIS — F5105 Insomnia due to other mental disorder: Secondary | ICD-10-CM | POA: Diagnosis not present

## 2023-02-18 DIAGNOSIS — F4310 Post-traumatic stress disorder, unspecified: Secondary | ICD-10-CM | POA: Diagnosis not present

## 2023-02-18 DIAGNOSIS — F411 Generalized anxiety disorder: Secondary | ICD-10-CM

## 2023-02-18 DIAGNOSIS — F3112 Bipolar disorder, current episode manic without psychotic features, moderate: Secondary | ICD-10-CM

## 2023-02-18 DIAGNOSIS — G2581 Restless legs syndrome: Secondary | ICD-10-CM | POA: Diagnosis not present

## 2023-02-18 MED ORDER — DIVALPROEX SODIUM ER 500 MG PO TB24: ORAL_TABLET | ORAL | 1 refills | Status: DC

## 2023-02-18 NOTE — Progress Notes (Signed)
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 contact office immediately if rash develops and to seek urgent medical attention if rash is severe and/or spreading quickly.  M benefits with Latuda and lamotrigine Combo Latuda 80 mg solved mania and irritability Lamictal 150 with refill. Continue cyclobenzaprine 20 mg nightly Continue Depakote 1000 mg nightly Continue doxazosin 6 mg nightly Continue gabapentin 400 mg twice daily and 800 mg nightly Continue lorazepam 1 mg 3 times daily 4 times daily Take eariler ropinirole 2 mg at 3 PM and 8 mg nightly. High dose necessary to control RLS reduced sertraline 50 mg daily DT steroid induced mania. continue Lunesta 3 for longer duration..  Discussed side effects including amnesia.  Need counseling and started Sanjuana Kava at [facility].  . Discussed safety plan at length with patient.  Advised patient to contact office with any worsening signs and symptoms.  Instructed patient to go to the [facility] emergency room for evaluation if experiencing any acute  safety concerns, to include suicidal intent.  Discussed potential metabolic side effects associated with atypical antipsychotics, as well as potential risk for movement side effects. Advised pt to contact office if movement side effects occur.   No  med changes today: She agrees with the plan  Follow-up in 1- 2 weeks  Lynder Parents MD, DFAPA Please see After Visit Summary for patient specific instructions.  Future Appointments  Date Time Provider [place]  03/05/2023 10:30 AM Clovis Pu, Hiram Comber  Fredirick Maudlin., MD [facility] None  04/13/2023 10:45 AM Melvyn Novas Christena Deem, MD [facility] None       No orders of the defined types were placed in this encounter.    -------------------------------

## 2023-03-03 ENCOUNTER — Telehealth: Payer: Self-pay | Admitting: Family Medicine

## 2023-03-03 NOTE — Telephone Encounter (Signed)
Contacted Beth [name] to schedule their annual wellness visit. Appointment made for 03/09/2023.  Thank you,  [name] Direct dial  [phone]

## 2023-03-05 ENCOUNTER — Ambulatory Visit: Payer: Medicare HMO | Admitting: Psychiatry

## 2023-03-08 NOTE — Progress Notes (Unsigned)
I connected with  Clovis Cao on 03/09/2023 by a audio enabled telemedicine application and verified that I am speaking with the correct person using two identifiers.  Patient Location: Home  Provider Location: Home Office  I discussed the limitations of evaluation and management by telemedicine. The patient expressed understanding and agreed to proceed.   Subjective:   Beth [name] is a 47 y.o. female who presents for an Initial Medicare Annual Wellness Visit.  Review of Systems    Per HPI unless specifically indicated below.           Objective:       02/17/2023    8:58 AM 02/06/2023    8:46 AM 01/20/2023    1:33 PM  Vitals with BMI  Height 5\' 7"  5\' 7"  5\' 7"   Weight 178 lbs 173 lbs 3 oz 170 lbs  BMI 27.87 [id number]  Systolic [id number]  Diastolic 60 72 74  Pulse 74 105 76    Today's Vitals   03/09/23 1513  PainSc: 0-No pain   There is no height or weight on file to calculate BMI.     03/09/2023    3:17 PM 01/20/2023    1:35 PM 08/12/2022    1:31 PM 05/30/2022   10:08 AM 04/30/2022   10:48 AM 04/21/2022    9:45 AM 04/18/2020   10:39 AM  Advanced Directives  Does Patient Have a Medical Advance Directive? No No No No No No No  Would patient like information on creating a medical advance directive? No - Patient declined No - Patient declined No - Patient declined No - Patient declined Yes (MAU/Ambulatory/Procedural Areas - Information given) Yes (MAU/Ambulatory/Procedural Areas - Information given)     Current Medications (verified) Outpatient Encounter Medications as of 03/09/2023  Medication Sig   albuterol (PROVENTIL) (2.5 MG/3ML) 0.083% nebulizer solution USE THREE MILLILITERS VIA NEBULIZATION BY MOUTH EVERY 6 HOURS AS NEEDED FOR WHEEZING OR SHORTNESS OF BREATH   albuterol (VENTOLIN HFA) 108 (90 Base) MCG/ACT inhaler INHALE ONE TO TWO PUFFS BY MOUTH EVERY 6 HOURS AS NEEDED FOR WHEEZING OR SHORTNESS OF BREATH   budesonide-formoterol (SYMBICORT) 80-4.5  MCG/ACT inhaler Take 2 puffs first thing in am and then another 2 puffs about 12 hours later.   cyclobenzaprine (FLEXERIL) 10 MG tablet TAKE 2 TABLETS AT BEDTIME   divalproex (DEPAKOTE ER) 500 MG 24 hr tablet TAKE TWO TABLETS BY MOUTH EVERY NIGHT AT BEDTIME   doxazosin (CARDURA) 4 MG tablet TAKE 1 AND 1/2 TABLETS AT BEDTIME   fluticasone (FLONASE) 50 MCG/ACT nasal spray Place 2 sprays into both nostrils daily.   gabapentin (NEURONTIN) 400 MG capsule TAKE 1 CAPSULE TWICE DAILY AND TAKE 2 CAPSULES IN THE EVENING   lamoTRIgine (LAMICTAL) 150 MG tablet Take 1 tablet (150 mg total) by mouth daily.   LORazepam (ATIVAN) 1 MG tablet Take 1 tablet (1 mg total) by mouth 4 (four) times daily.   lurasidone (LATUDA) 80 MG TABS tablet TAKE 2 TABLETS DAILY WITH SUPPER (Patient taking differently: Take 80 mg by mouth daily with breakfast. TAKE 1 TABLETS DAILY WITH SUPPER)   rOPINIRole (REQUIP) 2 MG tablet TAKE 1 TABLET AT 5 PM. TAKE IN ADDITION TO 8MG  AT BEDTIME.   rOPINIRole (REQUIP) 4 MG tablet TAKE 2 TABLETS AT BEDTIME   sertraline (ZOLOFT) 100 MG tablet TAKE 1 TABLET EVERY DAY (Patient taking differently: Take 50 mg by mouth daily.)   valACYclovir (VALTREX) 500 MG tablet Take one tablet by mouth  twice daily for 3-5 days then daily as needed.   Eszopiclone 3 MG TABS Take 1 tablet (3 mg total) by mouth at bedtime. Take immediately before bedtime   [DISCONTINUED] amoxicillin-clavulanate (AUGMENTIN) 875-125 MG tablet Take 1 tablet by mouth 2 (two) times daily. (Patient not taking: Reported on 03/09/2023)   [DISCONTINUED] divalproex (DEPAKOTE ER) 500 MG 24 hr tablet 1 for 3 nights then 2 at night (Patient not taking: Reported on 03/09/2023)   [DISCONTINUED] norethindrone (ORTHO MICRONOR) 0.35 MG tablet Take 1 tablet (0.35 mg total) by mouth daily. (Patient not taking: Reported on 03/09/2023)   [DISCONTINUED] predniSONE (DELTASONE) 10 MG tablet Take  4 each am x 2 days,   2 each am x 2 days,  1 each am x 2 days and stop  (Patient not taking: Reported on 03/09/2023)   [DISCONTINUED] traMADol (ULTRAM) 50 MG tablet Take 1 tablet (50 mg total) by mouth every 6 (six) hours as needed. (Patient not taking: Reported on 03/09/2023)   No facility-administered encounter medications on file as of 03/09/2023.    Allergies (verified) Patient has no known allergies.   History: Past Medical History:  Diagnosis Date   Anxiety    on meds   Bipolar disorder ([place])    Depression    on meds   HSV (herpes simplex virus) anogenital infection 05/2018   RLS (restless legs syndrome)    Seasonal allergies    Past Surgical History:  Procedure Laterality Date   BARTHOLIN CYST MARSUPIALIZATION     COLONOSCOPY  06/19/2022   IVF     WISDOM TOOTH EXTRACTION     Family History  Problem Relation Age of Onset   Thyroid disease Mother    COPD Mother    Bipolar disorder Mother    Parkinson's disease Father    Bipolar disorder Sister    Breast cancer Paternal Grandmother 83   Schizophrenia Other    Colon cancer Neg Hx    Colon polyps Neg Hx    Esophageal cancer Neg Hx    Rectal cancer Neg Hx    Stomach cancer Neg Hx    Social History   Socioeconomic History   Marital status: Married    Spouse name: [name],[name]   Number of children: 0   Years of education: 16   Highest education level: Associate degree: academic program  Occupational History   Occupation: unemployed   Occupation: disabled  Tobacco Use   Smoking status: Former    Packs/day: 1.00    Years: 27.00    Additional pack years: 0.00    Total pack years: 27.00    Types: Cigarettes    Start date: 12/15/1992    Quit date: 12/16/2019    Years since quitting: 3.2   Smokeless tobacco: Never   Tobacco comments:    Started at 47 years old. 1 pack/day. Quit 2 years ago.   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    Social Determinants of Health   Financial Resource Strain: Low Risk  (  03/09/2023)   Overall Financial Resource Strain (CARDIA)    Difficulty of Paying Living Expenses: Not hard at all  Food Insecurity: No Food Insecurity (03/09/2023)   Hunger Vital Sign    Worried About Running Out of Food in the Last Year: Never true    Ran Out of Food in the Last Year: Never true  Transportation Needs: No Transportation Needs (03/09/2023)   PRAPARE - Hydrologist (Medical): No    Lack of Transportation (Non-Medical): No  Physical Activity: Inactive (03/09/2023)   Exercise Vital Sign    Days of Exercise per Week: 0 days    Minutes of Exercise per Session: 0 min  Stress: Stress Concern Present (03/09/2023)   [place]    Feeling of Stress : Rather much  Social Connections: Socially Integrated (03/09/2023)   Social Connection and Isolation Panel [NHANES]    Frequency of Communication with Friends and Family: More than three times a week    Frequency of Social Gatherings with Friends and Family: Never    Attends Religious Services: More than 4 times per year    Active Member of [employer] or Organizations: Yes    Attends Archivist Meetings: Never    Marital Status: Married    Tobacco Counseling Counseling given: No Tobacco comments: Started at 47 years old. 1 pack/day. Quit 2 years ago.    Clinical Intake:  Pre-visit preparation completed: No  Pain : No/denies pain Pain Score: 0-No pain     Nutritional Status: BMI of 19-24  Normal Nutritional Risks: None Diabetes: No  How often do you need to have someone help you when you read  instructions, pamphlets, or other written materials from your doctor or pharmacy?: 1 - Never  Diabetic?No  Interpreter Needed?: No  Information entered by :: Donnie Mesa, CMA   Activities of Daily Living    03/09/2023    3:12 PM  In your present state of health, do you have any difficulty performing the following activities:  Hearing? 0  Vision? [facility]  Difficulty concentrating or making decisions? 0  Walking or climbing stairs? 0  Dressing or bathing? 0  Doing errands, shopping? 0    Patient Care Team: Donney Dice, DO as PCP - General (Family Medicine) Pieter Partridge, DO as Consulting Physician (Neurology)  Indicate any recent Medical Services you may have received from other than [facility] providers in the past year (date may be approximate).     Assessment:   This is a routine wellness examination for [facility].   Hearing/Vision screen Denies any hearing issues. Denies any change to her vision. Wear glasses. Annual Eye Exam [facility]..  Dietary issues and exercise activities discussed: Current Exercise Habits: The patient does not participate in regular exercise at present, Exercise limited by: None identified   Goals Addressed   None    Depression Screen    03/09/2023    3:10 PM 01/20/2023    1:34 PM 08/12/2022    1:30 PM 05/30/2022   10:08 AM 04/30/2022   10:47 AM 04/21/2022    9:46 AM 01/22/2022    9:30 AM  PHQ 2/9 Scores  PHQ - 2 Score 1 4 2 4 2 3    PHQ- 9 Score 8 11 11 25 8 11       Information is confidential and restricted. Go to Review Flowsheets to unlock data.    Fall Risk  03/09/2023    3:11 PM 04/18/2020   10:39 AM 12/13/2019   10:08 AM 06/15/2019   10:44 AM 02/09/2019    9:28 AM  Fall Risk   Falls in the past year? 0 0 0 0 0  Number falls in past yr: 0 0 0    Injury with Fall? 0 0 0    Risk for fall due to : No Fall Risks      Follow up Falls evaluation completed   Falls evaluation completed Falls evaluation completed    [place]:  Any stairs in or around the home? Yes  If so, are there any without handrails? No  Home free of loose throw rugs in walkways, pet beds, electrical cords, etc? Yes  Adequate lighting in your home to reduce risk of falls? Yes   ASSISTIVE DEVICES UTILIZED TO PREVENT FALLS:  Life alert? No  Use of a cane, walker or w/c? No  Grab bars in the bathroom? No  Shower chair or bench in shower? No  Elevated toilet seat or a handicapped toilet? No   TIMED UP AND GO:  Was the test performed?Unable to perform, virtual appointment   Cognitive Function:        03/09/2023    3:12 PM  6CIT Screen  What Year? 0 points  What month? 0 points  What time? 0 points  Count back from 20 0 points  Months in reverse 0 points  Repeat phrase 0 points  Total Score 0 points    Immunizations Immunization History  Administered Date(s) Administered   Moderna SARS-COV2 Booster Vaccination 02/08/2021   Moderna Sars-Covid-2 Vaccination 04/06/2020, 05/04/2020    TDAP status: Due, Education has been provided regarding the importance of this vaccine. Advised may receive this vaccine at local pharmacy or Health Dept. Aware to provide a copy of the vaccination record if obtained from local pharmacy or Health Dept. Verbalized acceptance and understanding.  Flu Vaccine status: Due, Education has been provided regarding the importance of this vaccine. Advised may receive this vaccine at local pharmacy or Health Dept. Aware to provide a copy of the vaccination record if obtained from local pharmacy or Health Dept. Verbalized acceptance and understanding.  Pneumococcal vaccine: not applicable   0000000 vaccine status: Information provided on how to obtain vaccines.   Qualifies for Shingles Vaccine? No   Screening Tests Health Maintenance  Topic Date Due   DTaP/Tdap/Td (1 - Tdap) Never done   COVID-19 Vaccine (4 - 2023-24 season) 08/15/2022   INFLUENZA VACCINE   03/15/2023 (Originally 07/15/2022)   Medicare Annual Wellness (AWV)  03/08/2024   PAP SMEAR-Modifier  09/18/2024   COLONOSCOPY (Pts 45-64yrs Insurance coverage will need to be confirmed)  06/19/2029   Hepatitis C Screening  Completed   HIV Screening  Completed   HPV VACCINES  Aged Out    Health Maintenance  Health Maintenance Due  Topic Date Due   DTaP/Tdap/Td (1 - Tdap) Never done   COVID-19 Vaccine (4 - 2023-24 season) 08/15/2022    Colorectal cancer screening: Type of screening: Colonoscopy. Completed 06/19/2022. Repeat every 7 years  Mammogram status: Completed 07/24/21. Repeat every year(overdue)  DEXA Scan: not applicable   Lung Cancer Screening: (Low Dose CT Chest recommended if Age 98-80 years, 30 pack-year currently smoking OR have quit w/in 15years.) does not qualify.   Lung Cancer Screening Referral: not applicable   Additional Screening:  Hepatitis C Screening: does qualify; Completed 04/21/2022  Vision Screening: Recommended  annual ophthalmology exams for early detection of glaucoma and other disorders of the eye. Is the patient up to date with their annual eye exam?  Yes  Who is the provider or what is the name of the office in which the patient attends annual eye exams? [facility]  If pt is not established with a provider, would they like to be referred to a provider to establish care? No .   Dental Screening: Recommended annual dental exams for proper oral hygiene  Community Resource Referral / Chronic Care Management: CRR required this visit?  No   CCM required this visit?  No      Plan:     I have personally reviewed and noted the following in the patient's chart:   Medical and social history Use of alcohol, tobacco or illicit drugs  Current medications and supplements including opioid prescriptions. Patient is not currently taking opioid prescriptions. Functional ability and status Nutritional status Physical activity Advanced  directives List of other physicians Hospitalizations, surgeries, and ER visits in previous 12 months Vitals Screenings to include cognitive, depression, and falls Referrals and appointments  In addition, I have reviewed and discussed with patient certain preventive protocols, quality metrics, and best practice recommendations. A written personalized care plan for preventive services as well as general preventive health recommendations were provided to patient.   Ms. Mackall , Thank you for taking time to come for your Medicare Wellness Visit. I appreciate your ongoing commitment to your health goals. Please review the following plan we discussed and let me know if I can assist you in the future.   These are the goals we discussed:  Goals   None     This is a list of the screening recommended for you and due dates:  Health Maintenance  Topic Date Due   DTaP/Tdap/Td vaccine (1 - Tdap) Never done   COVID-19 Vaccine (4 - 2023-24 season) 08/15/2022   Flu Shot  03/15/2023*   Medicare Annual Wellness Visit  03/08/2024   Pap Smear  09/18/2024   Colon Cancer Screening  06/19/2029   Hepatitis C Screening: USPSTF Recommendation to screen - Ages 18-79 yo.  Completed   HIV Screening  Completed   HPV Vaccine  Aged Out  *Topic was postponed. The date shown is not the original due date.      Wilson Singer, [place]   03/09/2023  Nurse Notes: Approximately 30 minute Non-Face -To-Face Medicare Wellness Visit         I have reviewed this visit and agree with the documentation.  [place]

## 2023-03-08 NOTE — Patient Instructions (Signed)

## 2023-03-09 ENCOUNTER — Ambulatory Visit (INDEPENDENT_AMBULATORY_CARE_PROVIDER_SITE_OTHER): Payer: Medicare HMO

## 2023-03-09 DIAGNOSIS — Z0000 Encounter for general adult medical examination without abnormal findings: Secondary | ICD-10-CM | POA: Diagnosis not present

## 2023-03-18 ENCOUNTER — Other Ambulatory Visit: Payer: Self-pay | Admitting: Psychiatry

## 2023-03-18 DIAGNOSIS — F315 Bipolar disorder, current episode depressed, severe, with psychotic features: Secondary | ICD-10-CM

## 2023-03-18 DIAGNOSIS — F411 Generalized anxiety disorder: Secondary | ICD-10-CM

## 2023-03-18 DIAGNOSIS — F4310 Post-traumatic stress disorder, unspecified: Secondary | ICD-10-CM

## 2023-04-01 ENCOUNTER — Other Ambulatory Visit: Payer: Self-pay | Admitting: Psychiatry

## 2023-04-01 ENCOUNTER — Encounter: Payer: Self-pay | Admitting: Psychiatry

## 2023-04-01 ENCOUNTER — Ambulatory Visit (INDEPENDENT_AMBULATORY_CARE_PROVIDER_SITE_OTHER): Payer: Medicare HMO | Admitting: Psychiatry

## 2023-04-01 DIAGNOSIS — F4310 Post-traumatic stress disorder, unspecified: Secondary | ICD-10-CM | POA: Diagnosis not present

## 2023-04-01 DIAGNOSIS — G2581 Restless legs syndrome: Secondary | ICD-10-CM | POA: Diagnosis not present

## 2023-04-01 DIAGNOSIS — F5105 Insomnia due to other mental disorder: Secondary | ICD-10-CM

## 2023-04-01 DIAGNOSIS — F411 Generalized anxiety disorder: Secondary | ICD-10-CM | POA: Diagnosis not present

## 2023-04-01 DIAGNOSIS — F99 Mental disorder, not otherwise specified: Secondary | ICD-10-CM

## 2023-04-01 DIAGNOSIS — F3112 Bipolar disorder, current episode manic without psychotic features, moderate: Secondary | ICD-10-CM | POA: Diagnosis not present

## 2023-04-01 DIAGNOSIS — F315 Bipolar disorder, current episode depressed, severe, with psychotic features: Secondary | ICD-10-CM

## 2023-04-01 MED ORDER — ESZOPICLONE 3 MG PO TABS: 3.0000 mg | ORAL_TABLET | Freq: Every day | ORAL | 3 refills | Status: DC

## 2023-04-01 MED ORDER — LORAZEPAM 1 MG PO TABS: 1.0000 mg | ORAL_TABLET | Freq: Four times a day (QID) | ORAL | 5 refills | Status: DC

## 2023-04-01 NOTE — Progress Notes (Signed)
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 follow liver enzymes as they were only mildly elevated.Disc DDI complications of CBZ and would need to check liver enzymes DT past history of mild elevations.  There are a lot of medication interactions.  This is the only option that won't worsen RLS except clozapine.   Consider Caplyta or Vraylar. Consider off label Rexulti bc less likely to cause RLS than alternatives.  The increase to 6 mg HS doxazosin was successful at managing nightmares.   No  Caffeine after noon.  This is helped.   continue Gabapentin for off label anxiety and RLS at previous dosage.  400 BID and 800 pm.  This has been helpful though has not resolve the problem.   Discussed the risk of polypharmacy.  She is on multiple medications.  Disc purpose of each of the meds and consider changing some of them.  Undesirable polypharmacy but necessary bc chronic psych instability with history severe sx.she is benefitting and tolerating meds.  Counseled patient  regarding potential benefits, risks, and side effects of Lamictal to include potential risk of Stevens-Johnson syndrome. Advised patient to stop taking Lamictal and contact office immediately if rash develops and to seek urgent medical attention if rash is severe and/or spreading quickly.  M benefits with Latuda and lamotrigine Combo Latuda 80 mg solved mania and irritability Lamictal  150 with refill. Continue cyclobenzaprine 20 mg nightly Continue Depakote 1000 mg nightly Continue doxazosin 6 mg nightly Continue gabapentin 400 mg twice daily and 800 mg nightly Continue lorazepam 1 mg 3 times daily 4 times daily Take eariler ropinirole 2 mg at 3 PM and 8 mg nightly. High dose necessary to control RLS reduced sertraline 50 mg daily DT steroid induced mania. continue Lunesta 3 for longer duration..  Discussed side effects including amnesia.  Need counseling and started Lowella Dandy at [facility].  . Discussed safety plan at length with patient.  Advised patient to contact office with any worsening signs and symptoms.  Instructed patient to go to the [facility] emergency room for evaluation if experiencing any acute safety concerns, to include suicidal intent.  Discussed potential metabolic side effects associated with atypical antipsychotics, as well as potential risk for movement side effects. Advised pt to contact office if movement side effects occur.   No  med changes today: She agrees with the plan  Follow-up 2 mos  Meredith Staggers MD, DFAPA Please see After Visit Summary for patient specific instructions.  Future Appointments  Date Time Provider Department Center  04/13/2023 10:45 AM [name], MD [facility] None  04/11/2024 11:00 AM [facility] ANNUAL WELLNESS VISIT [facility]       No orders of the defined types were placed in this encounter.    -------------------------------

## 2023-04-12 NOTE — Progress Notes (Deleted)
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 Asthma?       Had excellent ex tol and no need for any inhalers prior 03/2022 cxr at [facility] cone neg but rx as CAP and nebulizer turned things and never completely recovered maybe 75% never tried then acutely ill 01/30/23 severe cough > yellow mucus chest tightness sob  01/31/23 minute clinic pos type A flu rx tamiflu > overall 50% but no neb but continue to use inhaler.   History of Present Illness  02/06/2023  Pulmonary/ 1st office eval/Beth [name]  Chief Complaint  Patient presents with   Consult    Pneumonia 04/2022.  Influenza 02/01/23. Chest tightness, cough and wheeze.  Sx resolved by next day.  Dyspnea:  with housecleaning uses saba  seems to help -not working out yet  Cough: better now but not gone > am only is still slt  yellow  Sleep: disrupted since 01/30/23 at least once a night / mucinex  SABA use: much less now  Rec Zpak  Plan A = Automatic = Always=    Symbicort 80 Take 2 puffs first thing in am and then another 2 puffs about 12 hours later.  Work on inhaler technique: Plan B = Backup (to supplement plan A, not to replace it) Only use your albuterol inhaler as a rescue medication  Plan C = Crisis (instead of Plan B but only if Plan B stops working) - only use your albuterol nebulizer if you first try Plan B  Also  Ok to try albuterol 15 min before an activity (on alternating days)  that you know would usually make you short of breath Try prilosec otc 20mg   Take 30-60 min before first meal of the day and Pepcid ac (famotidine) 20 mg one @  bedtime until cough is completely gone for at least a week without the need for cough suppression GERD diet reviewed, bed blocks rec  Please schedule a follow up office visit in 2 weeks, sooner if needed  with all  medications /inhalers/ solutions in hand     02/17/2023  f/u ov/Beth [name] re: asthma/ doe    maint on  symbicort 80  2 bid / no meds/ using mints Chief Complaint  Patient presents with   Follow-up    Breathing has improved with symbicort. She was dx with flu 2 wks ago and has had prod cough with green sputum. She uses her albuterol inhaler about once per wk.   Dyspnea:  housecleaning  Cough: still coughing up green mucus sporadically during the day  Sleeping: on side flat bed one pillow no resp resp SABA use: none 02: none  Covid status:   vax x 3 not most recent Rec Augmentin 875 mg take one pill twice daily  X 10 days Prednisone 10 mg take  4 each am x 2 days,   2 each am x 2 days,  1 each am x 2 days and stop  Work on inhaler technique: Also  Ok to try albuterol 15 min before an activity (on alternating days)  that you know would usually make you short of breath   04/13/2023  f/u ov/Beth [name] re: asthma   maint on ***  No chief complaint on file.   Dyspnea:  *** Cough: ***  Sleeping: *** SABA use: *** 02: *** Covid status:   *** Lung cancer screening :  ***    No obvious day to day or daytime variability or assoc excess/ purulent sputum or mucus plugs or hemoptysis or cp or chest tightness, subjective wheeze or overt sinus or hb symptoms.   *** without nocturnal  or early am exacerbation  of respiratory  c/o's or need for noct saba. Also denies any obvious fluctuation of symptoms with weather or environmental changes or other aggravating or alleviating factors except as outlined above   No unusual exposure hx or h/o childhood pna/ asthma or knowledge of premature birth.  Current Allergies, Complete Past Medical History, Past Surgical History, Family History, and Social History were reviewed in [employer] record.  ROS  The following are not active complaints unless bolded Hoarseness, sore throat, dysphagia, dental problems, itching, sneezing,  nasal congestion  or discharge of excess mucus or purulent secretions, ear ache,   fever, chills, sweats, unintended wt loss or wt gain, classically pleuritic or exertional cp,  orthopnea pnd or arm/hand swelling  or leg swelling, presyncope, palpitations, abdominal pain, anorexia, nausea, vomiting, diarrhea  or change in bowel habits or change in bladder habits, change in stools or change in urine, dysuria, hematuria,  rash, arthralgias, visual complaints, headache, numbness, weakness or ataxia or problems with walking or coordination,  change in mood or  memory.        No outpatient medications have been marked as taking for the 04/13/23 encounter (Appointment) with [name], MD.             Objective:   wts   04/13/2023        ***  02/17/23 178 lb (80.7 kg)  02/06/23 173 lb 3.2 oz (78.6 kg)  01/20/23 170 lb (77.1 kg)    Vital signs reviewed  04/13/2023  - Note at rest 02 sats  ***% on ***   General appearance:    ***           Assessment

## 2023-04-13 ENCOUNTER — Ambulatory Visit: Payer: Medicare HMO | Admitting: Internal Medicine

## 2023-04-27 NOTE — Progress Notes (Deleted)
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 Asthma?       Had excellent ex tol and no need for any inhalers prior 03/2022 cxr at [facility] cone neg but rx as CAP and nebulizer turned things and never completely recovered maybe 75% never tried then acutely ill 01/30/23 severe cough > yellow mucus chest tightness sob  01/31/23 minute clinic pos type A flu rx tamiflu > overall 50% but no neb but continue to use inhaler.   History of Present Illness  02/06/2023  Pulmonary/ 1st office eval/Julyanna [name]  Chief Complaint  Patient presents with   Consult    Pneumonia 04/2022.  Influenza 02/01/23. Chest tightness, cough and wheeze.  Sx resolved by next day.  Dyspnea:  with housecleaning uses saba  seems to help -not working out yet  Cough: better now but not gone > am only is still slt  yellow  Sleep: disrupted since 01/30/23 at least once a night / mucinex  SABA use: much less now  Rec Zpak  Plan A = Automatic = Always=    Symbicort 80 Take 2 puffs first thing in am and then another 2 puffs about 12 hours later.  Work on inhaler technique: Plan B = Backup (to supplement plan A, not to replace it) Only use your albuterol inhaler as a rescue medication  Plan C = Crisis (instead of Plan B but only if Plan B stops working) - only use your albuterol nebulizer if you first try Plan B  Also  Ok to try albuterol 15 min before an activity (on alternating days)  that you know would usually make you short of breath Try prilosec otc 20mg   Take 30-60 min before first meal of the day and Pepcid ac (famotidine) 20 mg one @  bedtime until cough is completely gone for at least a week without the need for cough suppression GERD diet reviewed, bed blocks rec  Please schedule a follow up office visit in 2 weeks, sooner if needed  with all  medications /inhalers/ solutions in hand     02/17/2023  f/u ov/Beth [name] re: asthma/ doe    maint on  symbicort 80  2 bid / no meds/ using mints Chief Complaint  Patient presents with   Follow-up    Breathing has improved with symbicort. She was dx with flu 2 wks ago and has had prod cough with green sputum. She uses her albuterol inhaler about once per wk.   Dyspnea:  housecleaning  Cough: still coughing up green mucus sporadically during the day  Sleeping: on side flat bed one pillow no resp resp SABA use: none 02: none  Covid status:   vax x 3 not most recent Rec Augmentin 875 mg take one pill twice daily  X 10 days Prednisone 10 mg take  4 each am x 2 days,   2 each am x 2 days,  1 each am x 2 days and stop  Work on inhaler technique: Also  Ok to try albuterol 15 min before an activity (on alternating days)  that you know would usually make you short of breath   04/28/2023  f/u ov/Beth [name] re: asthma/doe   maint on ***  No chief complaint on file.   Dyspnea:  *** Cough: *** Sleeping: ***  SABA use: *** 02: *** Covid status:   *** Lung cancer screening :  ***    No obvious day to day or daytime variability or assoc excess/ purulent sputum or mucus plugs or hemoptysis or cp or chest tightness, subjective wheeze or overt sinus or hb symptoms.   *** without nocturnal  or early am exacerbation  of respiratory  c/o's or need for noct saba. Also denies any obvious fluctuation of symptoms with weather or environmental changes or other aggravating or alleviating factors except as outlined above   No unusual exposure hx or h/o childhood pna/ asthma or knowledge of premature birth.  Current Allergies, Complete Past Medical History, Past Surgical History, Family History, and Social History were reviewed in [employer] record.  ROS  The following are not active complaints unless bolded Hoarseness, sore throat, dysphagia, dental problems, itching, sneezing,  nasal  congestion or discharge of excess mucus or purulent secretions, ear ache,   fever, chills, sweats, unintended wt loss or wt gain, classically pleuritic or exertional cp,  orthopnea pnd or arm/hand swelling  or leg swelling, presyncope, palpitations, abdominal pain, anorexia, nausea, vomiting, diarrhea  or change in bowel habits or change in bladder habits, change in stools or change in urine, dysuria, hematuria,  rash, arthralgias, visual complaints, headache, numbness, weakness or ataxia or problems with walking or coordination,  change in mood or  memory.        No outpatient medications have been marked as taking for the 04/28/23 encounter (Appointment) with [name], MD.             Objective:   wts   04/28/2023        ***  02/17/23 178 lb (80.7 kg)  02/06/23 173 lb 3.2 oz (78.6 kg)  01/20/23 170 lb (77.1 kg)    Vital signs reviewed  04/28/2023  - Note at rest 02 sats  ***% on ***   General appearance:    ***           Assessment

## 2023-04-28 ENCOUNTER — Ambulatory Visit: Payer: Medicare HMO | Admitting: Psychiatry

## 2023-04-28 ENCOUNTER — Ambulatory Visit: Payer: Medicare HMO | Admitting: Internal Medicine

## 2023-05-07 ENCOUNTER — Encounter: Payer: Self-pay | Admitting: Internal Medicine

## 2023-05-21 ENCOUNTER — Other Ambulatory Visit: Payer: Self-pay | Admitting: Psychiatry

## 2023-05-21 DIAGNOSIS — F315 Bipolar disorder, current episode depressed, severe, with psychotic features: Secondary | ICD-10-CM

## 2023-05-21 DIAGNOSIS — F4310 Post-traumatic stress disorder, unspecified: Secondary | ICD-10-CM

## 2023-05-21 DIAGNOSIS — F411 Generalized anxiety disorder: Secondary | ICD-10-CM

## 2023-06-01 ENCOUNTER — Encounter: Payer: Self-pay | Admitting: Psychiatry

## 2023-06-01 ENCOUNTER — Ambulatory Visit (INDEPENDENT_AMBULATORY_CARE_PROVIDER_SITE_OTHER): Payer: Medicare HMO | Admitting: Psychiatry

## 2023-06-01 DIAGNOSIS — F3112 Bipolar disorder, current episode manic without psychotic features, moderate: Secondary | ICD-10-CM

## 2023-06-01 DIAGNOSIS — F5105 Insomnia due to other mental disorder: Secondary | ICD-10-CM | POA: Diagnosis not present

## 2023-06-01 DIAGNOSIS — F515 Nightmare disorder: Secondary | ICD-10-CM | POA: Diagnosis not present

## 2023-06-01 DIAGNOSIS — F315 Bipolar disorder, current episode depressed, severe, with psychotic features: Secondary | ICD-10-CM | POA: Diagnosis not present

## 2023-06-01 DIAGNOSIS — F411 Generalized anxiety disorder: Secondary | ICD-10-CM

## 2023-06-01 DIAGNOSIS — F4310 Post-traumatic stress disorder, unspecified: Secondary | ICD-10-CM

## 2023-06-01 DIAGNOSIS — F4312 Post-traumatic stress disorder, chronic: Secondary | ICD-10-CM

## 2023-06-01 DIAGNOSIS — F99 Mental disorder, not otherwise specified: Secondary | ICD-10-CM | POA: Diagnosis not present

## 2023-06-01 DIAGNOSIS — G2581 Restless legs syndrome: Secondary | ICD-10-CM

## 2023-06-01 MED ORDER — SERTRALINE HCL 100 MG PO TABS: 100.0000 mg | ORAL_TABLET | Freq: Every day | ORAL | 1 refills | Status: DC

## 2023-06-01 MED ORDER — DIVALPROEX SODIUM ER 500 MG PO TB24: ORAL_TABLET | ORAL | 1 refills | Status: DC

## 2023-06-01 MED ORDER — ESZOPICLONE 3 MG PO TABS: 3.0000 mg | ORAL_TABLET | Freq: Every day | ORAL | 3 refills | Status: DC

## 2023-06-01 NOTE — Progress Notes (Signed)
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 ropinirole and possible psychiatric side effects with that.   TR insomnia chronically.  But less awakening at present discussed Ambien amnesia and side effects. No change in sleep meds this visit RLS managed right now. RLS managed and neuro checked iron studies reportedly ok.  her auditory hallucinations have stopped.  But we can still consider if needed, Clozapine option.       Consider Caplyta or Vraylar. Consider off label Rexulti bc less likely to cause RLS than alternatives.  The increase to 6 mg HS doxazosin was successful at managing nightmares.   No  Caffeine after noon.  This is helped.   continue Gabapentin for off label anxiety and RLS at previous dosage.  400 BID and 800 pm.  This has been helpful though has not resolve the problem.   Discussed the risk of polypharmacy.  She is on multiple medications.  Disc purpose of each of the meds and consider changing some of them.  Undesirable  polypharmacy but necessary bc chronic psych instability with history severe sx.she is benefitting and tolerating meds.  Counseled patient regarding potential benefits, risks, and side effects of Lamictal to include potential risk of Stevens-Johnson syndrome. Advised patient to stop taking Lamictal and contact office immediately if rash develops and to seek urgent  medical attention if rash is severe and/or spreading quickly.  Latuda 80 mg solved mania and irritability Lamictal 150 with refill. Continue cyclobenzaprine 20 mg nightly for RLS Continue Depakote 1000 mg nightly Continue doxazosin 6 mg nightly Continue gabapentin 400 mg twice daily and 800 mg nightly Continue lorazepam 1 mg 3 times daily 4 times daily Take eariler ropinirole 2 mg at 3 PM and 8 mg nightly. High dose necessary to control RLS Increase back to sertraline 100 mg daily per her request Continue Lunesta 3 for longer duration vs Ambien.  Discussed side effects including amnesia.  Need counseling and started Lowella Dandy at [facility].  . Previously Discussed safety plan at length with patient.  Advised patient to contact office with any worsening signs and symptoms.  Instructed patient to go to the [facility] emergency room for evaluation if experiencing any acute safety concerns, to include suicidal intent.  Discussed potential metabolic side effects associated with atypical antipsychotics, as well as potential risk for movement side effects. Advised pt to contact office if movement side effects occur.   She agrees with the plan  Follow-up 2 mos  Meredith Staggers MD, DFAPA Please see After Visit Summary for patient specific instructions.  Future Appointments  Date Time Provider Department Center  04/11/2024 11:00 AM [facility] ANNUAL WELLNESS VISIT [facility]       No orders of the defined types were placed in this encounter.    -------------------------------

## 2023-06-16 ENCOUNTER — Telehealth: Payer: Self-pay | Admitting: Psychiatry

## 2023-06-16 NOTE — Telephone Encounter (Signed)
Pt lvm that she is very depressed. She was crying and asked if she could be put on a cancellation list. Pt has an appt in August. She also asked if she could be put on wellbutrin when her moms dog passed that is what they gave her and it helped. Please call her at [phone]

## 2023-06-16 NOTE — Telephone Encounter (Signed)
Called patient and she said she is depressed. She is not tearful. She sounds tired but affect doesn't sound flat. She is reporting depression at 10/10, anxiety 6/10. No new stressors. She gets to sleep but wakes up often. DENIES SI.   She said her mother was put on Wellbutrin when her dog died and it helped her, so she was asking for Rx. Past medication trials list bupropion, but no history showing in Epic medication history that she has been on it recently.   She reports taking medications as prescribed.   Prior psychiatric medication trials include Latuda 40 restless legs,  Fanapt which cause restless legs,  Loxapine weight,  Abilfiy 10 NM  Vraylar Saphris which caused weight gain & RLS, Liked saphris some. olanzapine weight gain,  Geodon,  Seroquel weight gain and restless legs,  risperidone restless legs,  Haloperidol 2 RLS,    loxapine cost and weight,  Latuda helped mood80 good resp but SE RLS worse? Caplyta   Sinemet, Mirapex, ropinirole gabapentin.  Lyrica weight gain,  lithium side effects wt gain,  lamotrigine, Equetro with mildly elevated liver enzymes, Depakote nausea   Lexapro, duloxetine, venlafaxine, bupropion, fluoxetine, sertraline,      Ambien, Restoril lost effect, mirtazapine increased appetite, trazodone restlessness,,doxepin ineffective,   cyclobenzaprine

## 2023-06-17 NOTE — Telephone Encounter (Signed)
Patient notified

## 2023-06-17 NOTE — Telephone Encounter (Signed)
Losing dog causes normal grief.  We just increased sertraline for this 2 weeks ago.  It is too early to add more meds and make this more complicated now.  No med changes at this time will consider  in a couple of weeks.

## 2023-06-24 ENCOUNTER — Encounter: Payer: Self-pay | Admitting: Psychiatry

## 2023-06-24 ENCOUNTER — Ambulatory Visit (INDEPENDENT_AMBULATORY_CARE_PROVIDER_SITE_OTHER): Payer: Medicare HMO | Admitting: Psychiatry

## 2023-06-24 DIAGNOSIS — G2581 Restless legs syndrome: Secondary | ICD-10-CM

## 2023-06-24 DIAGNOSIS — F4312 Post-traumatic stress disorder, chronic: Secondary | ICD-10-CM | POA: Diagnosis not present

## 2023-06-24 DIAGNOSIS — F99 Mental disorder, not otherwise specified: Secondary | ICD-10-CM | POA: Diagnosis not present

## 2023-06-24 DIAGNOSIS — F515 Nightmare disorder: Secondary | ICD-10-CM

## 2023-06-24 DIAGNOSIS — F411 Generalized anxiety disorder: Secondary | ICD-10-CM

## 2023-06-24 DIAGNOSIS — F4310 Post-traumatic stress disorder, unspecified: Secondary | ICD-10-CM | POA: Diagnosis not present

## 2023-06-24 DIAGNOSIS — F3132 Bipolar disorder, current episode depressed, moderate: Secondary | ICD-10-CM

## 2023-06-24 DIAGNOSIS — F5105 Insomnia due to other mental disorder: Secondary | ICD-10-CM | POA: Diagnosis not present

## 2023-06-24 MED ORDER — BUPROPION HCL ER (XL) 150 MG PO TB24: ORAL_TABLET | ORAL | 0 refills | Status: DC

## 2023-06-24 NOTE — Progress Notes (Signed)
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  Still taking with Depakote ER 1000 mg HS started during previous mania on steroids.  No mania now  Discussed the very high dose of recurrent ropinirole and possible psychiatric side effects with that.   TR insomnia chronically.  But less awakening at present discussed Ambien amnesia and side effects. No change in sleep meds this visit RLS managed right now. RLS managed and neuro checked iron studies reportedly ok.  her auditory hallucinations have stopped.  But we can still consider if needed, Clozapine option.       Consider Caplyta or Vraylar. Consider off label Rexulti bc less likely to cause RLS than alternatives.  The increase to 6 mg HS doxazosin was successful at managing nightmares.   No  Caffeine after noon.  This is helped.   continue Gabapentin for off label anxiety and RLS at previous dosage.  400 BID and 800 pm.  This has been helpful though has not resolve the problem.   Discussed the risk of  polypharmacy.  She is on multiple medications.  Disc purpose of each of the meds and consider changing some of them.  Undesirable polypharmacy but necessary bc chronic psych instability with history  severe sx.she is benefitting and tolerating meds.  Counseled patient regarding potential benefits, risks, and side effects of Lamictal to include potential risk of Stevens-Johnson syndrome. Advised patient to stop taking Lamictal and contact office immediately if rash develops and to seek urgent medical attention if rash is severe and/or spreading quickly.  Latuda 80 mg solved mania and irritability Lamictal 150  Continue cyclobenzaprine 20 mg nightly for RLS Continue Depakote 1000 mg nightly Continue doxazosin 6 mg nightly For RLS increase gabapentin 400 mg twice daily and 1200 mg nightly Continue lorazepam 1 mg 3 times daily 4 times daily ropinirole 2 mg at 3 PM and 8 mg nightly. High dose necessary to control RLS sertraline 100 mg daily per her request Per her request ok trial Wellbutrin 150 then 300 mg AM Continue Lunesta 3 for longer duration vs Ambien.  Discussed side effects including amnesia.  Need counseling and started Lowella Dandy at [facility].  . Previously Discussed safety plan at length with patient.  Advised patient to contact office with any worsening signs and symptoms.  Instructed patient to go to the [facility] emergency room for evaluation if experiencing any acute safety concerns, to include suicidal intent.  Discussed potential metabolic side effects associated with atypical antipsychotics, as well as potential risk for movement side effects. Advised pt to contact office if movement side effects occur.   She agrees with the plan  Follow-up 2 mos  Meredith Staggers MD, DFAPA Please see After Visit Summary for patient specific instructions.  Future Appointments  Date Time Provider Department Center  08/03/2023  9:30 AM Cottle, Steva Ready., MD [facility] None  04/11/2024  11:00 AM [facility] ANNUAL WELLNESS VISIT [facility]       No orders of the defined types were placed in this encounter.    -------------------------------

## 2023-07-01 ENCOUNTER — Other Ambulatory Visit: Payer: Self-pay | Admitting: Psychiatry

## 2023-07-01 DIAGNOSIS — F315 Bipolar disorder, current episode depressed, severe, with psychotic features: Secondary | ICD-10-CM

## 2023-07-06 ENCOUNTER — Other Ambulatory Visit: Payer: Self-pay | Admitting: Psychiatry

## 2023-07-06 DIAGNOSIS — F3132 Bipolar disorder, current episode depressed, moderate: Secondary | ICD-10-CM

## 2023-07-17 ENCOUNTER — Telehealth: Payer: Self-pay | Admitting: Psychiatry

## 2023-07-17 DIAGNOSIS — G2581 Restless legs syndrome: Secondary | ICD-10-CM

## 2023-07-17 MED ORDER — ROPINIROLE HCL 2 MG PO TABS
ORAL_TABLET | ORAL | 0 refills | Status: DC
Start: 2023-07-17 — End: 2023-07-27

## 2023-07-17 MED ORDER — ROPINIROLE HCL 4 MG PO TABS
8.0000 mg | ORAL_TABLET | Freq: Every day | ORAL | 0 refills | Status: DC
Start: 2023-07-17 — End: 2023-10-02

## 2023-07-17 NOTE — Telephone Encounter (Signed)
Called [facility] and RF had not been requested. Asked them to fill. Sent 2-week supply to the requested pharmacy to cover her.

## 2023-07-17 NOTE — Telephone Encounter (Signed)
Pt LVM @ 1:02p stating that the Ropinirole from her mail order pharmacy has not been received yet.  She is asking to have an "emergency" script sent to   [facility] [id number] [place], [place] - [address] [place] [address] Phone: [phone]  Fax: [phone]   She is out of meds and would like it sent asap.  She doesn't want to suffer with her restless legs over the weekend.  Next appt 8/19

## 2023-07-27 ENCOUNTER — Other Ambulatory Visit: Payer: Self-pay | Admitting: Psychiatry

## 2023-07-27 DIAGNOSIS — G2581 Restless legs syndrome: Secondary | ICD-10-CM

## 2023-07-31 ENCOUNTER — Other Ambulatory Visit: Payer: Self-pay | Admitting: Psychiatry

## 2023-08-03 ENCOUNTER — Ambulatory Visit (INDEPENDENT_AMBULATORY_CARE_PROVIDER_SITE_OTHER): Payer: Medicare HMO | Admitting: Psychiatry

## 2023-08-03 ENCOUNTER — Encounter: Payer: Self-pay | Admitting: Psychiatry

## 2023-08-03 DIAGNOSIS — F3132 Bipolar disorder, current episode depressed, moderate: Secondary | ICD-10-CM

## 2023-08-03 DIAGNOSIS — G2581 Restless legs syndrome: Secondary | ICD-10-CM

## 2023-08-03 DIAGNOSIS — F411 Generalized anxiety disorder: Secondary | ICD-10-CM

## 2023-08-03 DIAGNOSIS — F4312 Post-traumatic stress disorder, chronic: Secondary | ICD-10-CM

## 2023-08-03 DIAGNOSIS — F99 Mental disorder, not otherwise specified: Secondary | ICD-10-CM | POA: Diagnosis not present

## 2023-08-03 DIAGNOSIS — F5105 Insomnia due to other mental disorder: Secondary | ICD-10-CM | POA: Diagnosis not present

## 2023-08-03 DIAGNOSIS — F315 Bipolar disorder, current episode depressed, severe, with psychotic features: Secondary | ICD-10-CM | POA: Diagnosis not present

## 2023-08-03 DIAGNOSIS — F4310 Post-traumatic stress disorder, unspecified: Secondary | ICD-10-CM

## 2023-08-03 DIAGNOSIS — F515 Nightmare disorder: Secondary | ICD-10-CM | POA: Diagnosis not present

## 2023-08-03 MED ORDER — SERTRALINE HCL 100 MG PO TABS
50.0000 mg | ORAL_TABLET | Freq: Every day | ORAL | Status: DC
Start: 2023-08-03 — End: 2023-12-17

## 2023-08-03 MED ORDER — QUETIAPINE FUMARATE ER 200 MG PO TB24
ORAL_TABLET | ORAL | 0 refills | Status: DC
Start: 2023-08-03 — End: 2023-09-01

## 2023-08-03 NOTE — Progress Notes (Signed)
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 (ZOLOFT) 100 MG tablet; Take 0.5 tablets (50 mg total) by mouth daily.  Generalized anxiety disorder -     sertraline (ZOLOFT) 100 MG tablet; Take 0.5 tablets (50 mg total) by mouth daily.  RLS (restless legs syndrome)  Insomnia due to other mental disorder  Nightmares associated with chronic post-traumatic stress disorder  Bipolar disorder, current episode depressed, severe, with psychotic features (HCC) -     sertraline (ZOLOFT) 100 MG tablet; Take 0.5 tablets (50 mg total) by mouth daily.    30 min face to face time with patient was spent on counseling and coordination of care. We discussed the following: Tina has treatment resistant bipolar disorder with psychotic features versus schizoaffective disorder and other psychiatric diagnoses as well.  She has been very difficult to treat in part because she is very sensitive to antipsychotics and yet has strong history of chronic auditory hallucinations.    She is chronically anxious and  depressed.  Extensive history of psychiatric meds tried discussed with the patient.   Was Markedly better with return to Latuda 80 mg daily with weight  loss and better stability but recently more unstable and she wants to switch back and retry Seroquel ER. Loss of dog caused dep but resolved when  Wellbutrin caused mania and had to be DCd.    Still taking with Depakote ER 1000 mg HS started during previous mania on steroids.    Discussed the very high dose of recurrent ropinirole and possible psychiatric side effects with that.   TR insomnia chronically.  But less awakening at present discussed Ambien amnesia and side effects. No change in sleep meds this visit RLS managed right now. RLS managed and neuro checked iron studies reportedly ok. Try to stop Lunesta if not needed on Seroqel.  her auditory hallucinations have stopped.  But we can still consider if needed, Clozapine option.       Consider off label Rexulti bc less likely to cause RLS than alternatives but she needs generics for cost reasons.  The increase to 6 mg HS doxazosin was successful at managing nightmares.   No  Caffeine after noon.  This is helped.   continue Gabapentin for off label anxiety and RLS at previous dosage.  400 BID and 800 pm.  This has been helpful though has not resolve the problem.   Discussed the risk of polypharmacy.  She is on multiple medications.  Disc purpose of each of the meds and consider changing some of them.  Undesirable polypharmacy but necessary bc chronic psych instability with history severe sx.she is benefitting and tolerating meds.  Counseled patient regarding potential benefits, risks, and side effects of Lamictal to include potential risk of Stevens-Johnson syndrome. Advised patient to stop taking Lamictal and contact office immediately if rash develops and to seek urgent medical attention if rash is severe and/or spreading quickly.  Wean Latuda and switch back to Seroquel per her  request. Start quetiapine ER 200 mg 1 at night for 1 week then 2 at night Reduce Latuda to 1/2 of 80 mg tablet for 1 week then stop it. Lamictal 150  Continue cyclobenzaprine 20 mg nightly for RLS Continue Depakote 1000 mg nightly Continue doxazosin 6 mg nightly For RLS gabapentin 400 mg twice daily and 1200 mg nightly Continue lorazepam 1 mg 3 times daily 4 times daily ropinirole 2 mg at 3 PM and 8 mg nightly. High dose necessary to control RLS Reduce sertraline 50 mg daily DT mania DC Wellbutrin Dt mania Continue Lunesta 3 for longer duration vs Ambien.  Discussed side effects including amnesia.  Need counseling and started Lowella Dandy at [facility]  . Previously Discussed safety plan at length with patient.  Advised patient to contact office with any worsening signs and symptoms.  Instructed patient to go to the [facility] emergency room for evaluation if experiencing any acute safety concerns, to include suicidal intent.  Discussed potential metabolic side effects associated with atypical antipsychotics, as well as potential risk for movement side effects. Advised pt to contact office if movement side effects occur.   She agrees with the plan  Follow-up 2 mos  Meredith Staggers MD, DFAPA Please see After Visit Summary for patient specific instructions.  Future Appointments  Date Time Provider Department Center  09/01/2023  9:00 AM Cottle, Steva Ready., MD [facility] None  04/11/2024 11:00 AM [facility] ANNUAL WELLNESS VISIT [facility]       No orders of the defined types were placed in this encounter.    -------------------------------

## 2023-08-03 NOTE — Patient Instructions (Signed)
Start quetiapine ER 200 mg 1 at night for 1 week then 2 at night Reduce Latuda to 1/2 of 80 mg tablet for 1 week then stop it.

## 2023-08-10 ENCOUNTER — Telehealth: Payer: Self-pay | Admitting: Psychiatry

## 2023-08-10 NOTE — Telephone Encounter (Signed)
PT lvm that she is taking seroquel. She said it makes her feel drunk and  she stopped taking it. Please call her with another suggestion . Her number is [phone]

## 2023-08-12 NOTE — Telephone Encounter (Signed)
LVM to [facility] 

## 2023-08-26 ENCOUNTER — Other Ambulatory Visit: Payer: Self-pay | Admitting: Psychiatry

## 2023-08-26 DIAGNOSIS — F315 Bipolar disorder, current episode depressed, severe, with psychotic features: Secondary | ICD-10-CM

## 2023-08-30 ENCOUNTER — Other Ambulatory Visit: Payer: Self-pay | Admitting: Psychiatry

## 2023-08-30 DIAGNOSIS — F3132 Bipolar disorder, current episode depressed, moderate: Secondary | ICD-10-CM

## 2023-08-30 NOTE — Telephone Encounter (Signed)
Has appt 9/17.

## 2023-09-01 ENCOUNTER — Encounter: Payer: Self-pay | Admitting: Psychiatry

## 2023-09-01 ENCOUNTER — Ambulatory Visit (INDEPENDENT_AMBULATORY_CARE_PROVIDER_SITE_OTHER): Payer: Medicare HMO | Admitting: Psychiatry

## 2023-09-01 DIAGNOSIS — F99 Mental disorder, not otherwise specified: Secondary | ICD-10-CM | POA: Diagnosis not present

## 2023-09-01 DIAGNOSIS — G2581 Restless legs syndrome: Secondary | ICD-10-CM | POA: Diagnosis not present

## 2023-09-01 DIAGNOSIS — F515 Nightmare disorder: Secondary | ICD-10-CM | POA: Diagnosis not present

## 2023-09-01 DIAGNOSIS — F3132 Bipolar disorder, current episode depressed, moderate: Secondary | ICD-10-CM

## 2023-09-01 DIAGNOSIS — F5105 Insomnia due to other mental disorder: Secondary | ICD-10-CM | POA: Diagnosis not present

## 2023-09-01 DIAGNOSIS — F4312 Post-traumatic stress disorder, chronic: Secondary | ICD-10-CM | POA: Diagnosis not present

## 2023-09-01 DIAGNOSIS — F4310 Post-traumatic stress disorder, unspecified: Secondary | ICD-10-CM

## 2023-09-01 DIAGNOSIS — F411 Generalized anxiety disorder: Secondary | ICD-10-CM | POA: Diagnosis not present

## 2023-09-01 MED ORDER — OXCARBAZEPINE 300 MG PO TABS
ORAL_TABLET | ORAL | 1 refills | Status: DC
Start: 2023-09-01 — End: 2023-09-30

## 2023-09-01 NOTE — Telephone Encounter (Signed)
Med changed

## 2023-09-01 NOTE — Progress Notes (Signed)
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 "VALPROATE", "CBMZ"   .res Assessment: Plan:    Sanaia was seen today for follow-up, depression and anxiety.  Diagnoses and all orders for this visit:  Bipolar disorder with moderate depression (HCC) -     Oxcarbazepine (TRILEPTAL) 300 MG tablet; Take 1/2 tab po QHS x 3 days, then increase to 1 tab po at bedtime for 5 nights then 2 tablets at night  PTSD (post-traumatic stress disorder) -     Oxcarbazepine (TRILEPTAL) 300 MG tablet; Take 1/2 tab po QHS x 3 days, then increase to 1 tab po at bedtime for 5 nights then 2 tablets at night  Generalized anxiety disorder -     Oxcarbazepine (TRILEPTAL) 300 MG tablet; Take 1/2 tab po QHS x 3 days, then increase to 1 tab po at bedtime for 5 nights then 2 tablets at night  RLS (restless legs syndrome)  Insomnia due to other mental disorder  Nightmares associated with chronic post-traumatic stress disorder     30 min face to face time with patient was spent on counseling  and  coordination of care. We discussed the following: Jeda has treatment resistant bipolar disorder with psychotic features versus schizoaffective disorder and other psychiatric diagnoses as well.  She has been very difficult to treat in part because she is very sensitive to antipsychotics and yet has strong history of chronic auditory hallucinations.    She is chronically anxious and depressed.  Extensive history of psychiatric meds tried discussed with the patient.   Was Markedly better with return to Latuda 80 mg daily with weight loss and better stability but recently more unstable and she wants to switch back and retry Seroquel ER. Loss of dog caused dep but resolved when  Wellbutrin caused mania and had to be DCd.    Still taking with Depakote ER 1000 mg HS started during previous mania on steroids.    Discussed the very high dose of recurrent ropinirole and possible psychiatric side effects with that.   TR insomnia chronically.  But less awakening at present discussed Ambien amnesia and side effects. No change in sleep meds this visit RLS managed right now. RLS managed and neuro checked iron studies reportedly ok.  her auditory hallucinations have stopped.  But we can still consider if needed, Clozapine option.       Consider off label Rexulti bc less likely to cause RLS than alternatives but she needs generics for cost reasons.  The increase to 6 mg HS doxazosin was successful at managing nightmares.   No  Caffeine after noon.  This is helped.   continue Gabapentin for off label anxiety and RLS at previous dosage.  400 BID and 800 pm.  This has been helpful though has not resolve the problem.   Discussed the risk of polypharmacy.  She is on multiple medications.  Disc purpose of each of the meds and consider changing some of them.  Undesirable polypharmacy but necessary bc chronic psych instability with history severe sx.she is benefitting and tolerating meds.  Counseled patient  regarding potential benefits, risks, and side effects of Lamictal to include potential risk of Stevens-Johnson syndrome. Advised patient to stop taking Lamictal and contact office immediately if rash develops and to seek urgent medical attention if rash is severe and/or spreading quickly.  Lamictal 150  Continue cyclobenzaprine 20 mg nightly for RLS Continue Depakote 1000 mg nightly Continue doxazosin 6 mg nightly For RLS gabapentin 400 mg twice daily and 1200 mg nightly Continue lorazepam 1 mg 3 times daily 4 times daily ropinirole 2 mg at 3 PM and 8 mg nightly. High dose necessary to control RLS Reduced sertraline 50 mg daily DT mania Continue Lunesta 3 for longer duration vs Ambien.  Discussed side effects including amnesia.  For mood cycling disc Trileptal bc TR status and low wt gain risk.   Disc off label. Start Trileptal 150 mg HS and increase to 600 mg HS  Need counseling and started Lowella Dandy at [facility].  . Previously Discussed safety plan at length with patient.  Advised patient to contact office with any worsening signs and symptoms.  Instructed patient to go to the [facility] emergency room for evaluation if experiencing any acute safety concerns, to include suicidal intent.  Discussed potential metabolic side effects associated with atypical antipsychotics, as well as potential risk for movement side effects. Advised pt to contact office if movement side effects occur.   She agrees with the plan  Follow-up 1 mos  Meredith Staggers MD, DFAPA Please see After Visit Summary for patient specific instructions.  Future Appointments  Date Time Provider Department Center  09/30/2023  9:30 AM Cottle, Steva Ready., MD [facility] None  04/11/2024 11:00 AM [facility] ANNUAL WELLNESS VISIT [facility]       No orders of the defined types were placed in this encounter.    -------------------------------

## 2023-09-13 ENCOUNTER — Other Ambulatory Visit: Payer: Self-pay | Admitting: Psychiatry

## 2023-09-13 NOTE — Telephone Encounter (Signed)
Is she still taking?

## 2023-09-14 NOTE — Progress Notes (Unsigned)
    SUBJECTIVE:   CHIEF COMPLAINT / HPI: Asthma  Symbicort-not working well now using rescue Albuterol inhaler 3-4 times a day for past 2 weeks Coughing as well- not productive Quit smoking 2 years ago --smoked 1 PPD for 20+ years No fevers No vomiting No PFTs in chart  PHQ-9  Question 9 is 1 = denies any active plan, follows closely with therapist    09/15/2023    9:02 AM 03/09/2023    3:10 PM 01/20/2023    1:34 PM 08/12/2022    1:30 PM 05/30/2022   10:08 AM  Depression screen PHQ 2/9  Decreased Interest 2 1 3 1 1   Down, Depressed, Hopeless 3 0 1 1 3   PHQ - 2 Score 5 1 4 2 4   Altered sleeping 3 3 2 2 3   Tired, decreased energy 3 3 2 3 3   Change in appetite 2 0 2 1 3   Feeling bad or failure about yourself  3 1 1 1 3   Trouble concentrating 3 0 0 2 3  Moving slowly or fidgety/restless 3 0 0 0 3  Suicidal thoughts 1 0 0 0 3  PHQ-9 Score 23 8 11 11 25   Difficult doing work/chores Extremely dIfficult Not difficult at all Extremely dIfficult Extremely dIfficult Extremely dIfficult     PERTINENT  PMH / PSH: anxiety  OBJECTIVE:   BP 125/80   Pulse 99   Temp 98.1 F (36.7 C)   Ht 5\' 7"  (1.702 m)   Wt 178 lb 12.8 oz (81.1 kg)   SpO2 96%   BMI 28.00 kg/m   General: Well appearing, NAD, awake, alert, responsive to questions Head: Normocephalic atraumatic CV: Regular rate and rhythm no murmurs rubs or gallops Respiratory:Upper lung fields with wheezes, no focal findings, chest rises symmetrically,  no increased work of breathing, speaking full sentences Extremities: Moves upper and lower extremities freely, no edema in LE, no calf tenderness  ASSESSMENT/PLAN:   Cough variant asthma Wheezing on examination.  Has been coughing as well however no fevers, less likely to be pneumonia.  No sputum production however does have a long smoking history.  No PFTs in chart.  Will treat as asthma exacerbation as well as cover for COPD with azithromycin. -Prednisone 40 mg for 5  days -Azithromycin 500 for 3 days -Return to clinic to see Dr. Raymondo Band in 2 weeks after symptoms have resolved for pulmonary function test -ED/return precautions discussed, if not improving will order chest x-ray for evaluation of pneumonia  Bipolar disorder, unspecified (HCC) Passive suicidality today. States she feels this way often but is following with therapy closely. Safety contracts. -Monitor on follow up -Follows with behavioral health closely    Levin Erp, MD [facility]

## 2023-09-15 ENCOUNTER — Telehealth: Payer: Self-pay | Admitting: Psychiatry

## 2023-09-15 ENCOUNTER — Ambulatory Visit (INDEPENDENT_AMBULATORY_CARE_PROVIDER_SITE_OTHER): Payer: Medicare HMO

## 2023-09-15 VITALS — BP 125/80 | HR 99 | Temp 98.1°F | Ht 67.0 in | Wt 178.8 lb

## 2023-09-15 DIAGNOSIS — J4541 Moderate persistent asthma with (acute) exacerbation: Secondary | ICD-10-CM | POA: Diagnosis not present

## 2023-09-15 DIAGNOSIS — F315 Bipolar disorder, current episode depressed, severe, with psychotic features: Secondary | ICD-10-CM

## 2023-09-15 DIAGNOSIS — J45991 Cough variant asthma: Secondary | ICD-10-CM | POA: Diagnosis not present

## 2023-09-15 MED ORDER — PREDNISONE 20 MG PO TABS
40.0000 mg | ORAL_TABLET | Freq: Every day | ORAL | 0 refills | Status: AC
Start: 2023-09-15 — End: 2023-09-20

## 2023-09-15 MED ORDER — AZITHROMYCIN 500 MG PO TABS
ORAL_TABLET | ORAL | 0 refills | Status: DC
Start: 2023-09-15 — End: 2024-02-02

## 2023-09-15 NOTE — Patient Instructions (Signed)
It was great to see you! Thank you for allowing me to participate in your care!   Our plans for today:  - I am sending in steroid for 5 days - I am sending in azithromycin to take for 3 days - If not improving or any fevers please call us and I will order a chest x ray - I would like you to schedule an appointment with Dr. Raymondo Band in 2 weeks for PFTs (when you have no symptoms)  Take care and seek immediate care sooner if you develop any concerns.  Levin Erp, MD

## 2023-09-15 NOTE — Assessment & Plan Note (Addendum)
Passive suicidality today. States she feels this way often but is following with therapy closely. Safety contracts. -Monitor on follow up -Follows with behavioral health closely

## 2023-09-15 NOTE — Telephone Encounter (Signed)
Pt LVM @ 9:35a stating that her insurance said they will not cover the Depokote.  She said she's been taking it.  She doesn't know if they are wanting a PA or what.  Pharmacy is   [facility] - [place], [place] - [address] [place] [address] Phone: [phone]  Fax: [phone]   Next appt 10/16

## 2023-09-15 NOTE — Assessment & Plan Note (Signed)
Wheezing on examination.  Has been coughing as well however no fevers, less likely to be pneumonia.  No sputum production however does have a long smoking history.  No PFTs in chart.  Will treat as asthma exacerbation as well as cover for COPD with azithromycin. -Prednisone 40 mg for 5 days -Azithromycin 500 for 3 days -Return to clinic to see Dr. Raymondo Band in 2 weeks after symptoms have resolved for pulmonary function test -ED/return precautions discussed, if not improving will order chest x-ray for evaluation of pneumonia

## 2023-09-15 NOTE — Telephone Encounter (Signed)
Called pharmacy. They wanted a clarification. Patient has been on Depakote long-term and was recently prescribed Trileptal. Per 9/17 note she should be on both. Pharmacy said they would go ahead and fill and patient was notified.

## 2023-09-15 NOTE — Telephone Encounter (Signed)
LVM TO RC

## 2023-09-18 ENCOUNTER — Other Ambulatory Visit: Payer: Self-pay | Admitting: Nurse Practitioner

## 2023-09-18 DIAGNOSIS — Z1231 Encounter for screening mammogram for malignant neoplasm of breast: Secondary | ICD-10-CM

## 2023-09-26 ENCOUNTER — Ambulatory Visit
Admission: RE | Admit: 2023-09-26 | Discharge: 2023-09-26 | Disposition: A | Discharge status: Home / Self Care | Payer: Medicare HMO | Source: Ambulatory Visit | Attending: Nurse Practitioner

## 2023-09-26 DIAGNOSIS — Z1231 Encounter for screening mammogram for malignant neoplasm of breast: Secondary | ICD-10-CM | POA: Diagnosis not present

## 2023-09-28 ENCOUNTER — Encounter: Payer: Self-pay | Admitting: Pharmacist

## 2023-09-28 ENCOUNTER — Ambulatory Visit: Payer: Medicare HMO | Admitting: Pharmacist

## 2023-09-28 ENCOUNTER — Other Ambulatory Visit: Payer: Self-pay | Admitting: Psychiatry

## 2023-09-28 ENCOUNTER — Other Ambulatory Visit (HOSPITAL_COMMUNITY): Payer: Self-pay

## 2023-09-28 VITALS — BP 107/65 | HR 63 | Ht 67.0 in | Wt 187.0 lb

## 2023-09-28 DIAGNOSIS — G2581 Restless legs syndrome: Secondary | ICD-10-CM

## 2023-09-28 DIAGNOSIS — Z23 Encounter for immunization: Secondary | ICD-10-CM

## 2023-09-28 DIAGNOSIS — J45991 Cough variant asthma: Secondary | ICD-10-CM | POA: Diagnosis not present

## 2023-09-28 DIAGNOSIS — F315 Bipolar disorder, current episode depressed, severe, with psychotic features: Secondary | ICD-10-CM

## 2023-09-28 DIAGNOSIS — F515 Nightmare disorder: Secondary | ICD-10-CM

## 2023-09-28 DIAGNOSIS — F4310 Post-traumatic stress disorder, unspecified: Secondary | ICD-10-CM

## 2023-09-28 MED ORDER — ALBUTEROL SULFATE HFA 108 (90 BASE) MCG/ACT IN AERS: 2.0000 | INHALATION_SPRAY | Freq: Four times a day (QID) | RESPIRATORY_TRACT | 3 refills | Status: AC | PRN

## 2023-09-28 MED ORDER — TRELEGY ELLIPTA 100-62.5-25 MCG/ACT IN AEPB: 1.0000 | INHALATION_SPRAY | Freq: Every day | RESPIRATORY_TRACT | 11 refills | Status: DC

## 2023-09-28 NOTE — Patient Instructions (Addendum)
It was nice to see you today!  Lung function showed severe obstruction.   Medication Changes: START Trelegy Ellipta inhaler (fluticasone-umeclidinium-vilanterol 100-62.5-2.5 mcg/actuation) once daily and rinse mouth out afterwards.   Continue albuterol rescue inhaler as needed  Discontinue Symbicort inhaler (budesonide-formoterol)  Continue all other medication the same.

## 2023-09-28 NOTE — Progress Notes (Signed)
S:     Chief Complaint  Patient presents with   Medication Management    PFTs   47 y.o. female who presents for respiratory evaluation, education, and management. Patient arrives in good spirits and presents without any assistance. Patient is accompanied by her mother, Eber Jones.   Patient was referred and last seen by Primary Care Provider, Dr. Laroy Apple, on 09/15/2023.  Patient has seen Pulmonologist, Dr. Sherene Sires, earlier this year.  She shared that she currently does not have a follow-up plan with Pulmonology.   PMH - patient reports significant for Asthma but not as a child.  Reports asthma diagnosed ~1.5 yrs ago at the ED, that is when inhalers were prescribed. Reports Symbicort (budesonide-formoterol) doesn't really work for her. Takes albuterol inhaler ~2 times weekly, does not use albuterol nebulizer solution. Reports that albuterol works well for her.   At last visit, Pt reported wheezing and non-productive coughing, with no fever or sputum production. Treated as asthma exacerbation and covered for COPD with prednisone 40 mg x 5 days, and azithromycin 500 mg x 3 days.  Reports her watch checks her oxygen level when sleeping, 70s-80s. Is not very active, struggles to keep up with husband when walking. Former smoker, smoked 1 pack/day for ~28 years, quit ~2 years ago.   Patient reports breathing has been good today (8/10), this is a normal/good day for her She denies taking any bronchodilator within the last 12 hours.    O: Review of Systems  All other systems reviewed and are negative.   Physical Exam Constitutional:      Appearance: Normal appearance.  Pulmonary:     Effort: Pulmonary effort is normal.  Neurological:     Mental Status: She is alert.  Psychiatric:        Mood and Affect: Mood normal.        Behavior: Behavior normal.        Thought Content: Thought content normal.        Judgment: Judgment normal.    Vitals:   09/28/23 0901  BP: 107/65  Pulse: 63   SpO2: 97%    Medication adherence okay. Patient reports last dose of COPD medications was last week for her Symbicort. Using albuterol inhaler a few times a week. Does not use albuterol nebulizer, but adherent to all other medications.  Current COPD medications: Symbicort (budesonide-formoterol 80-4.5 mcg/act), albuterol inhaler,  Rescue inhaler use frequency: ~ 2 times weekly, most recent use due to when she slept on her back (normally doesn't do) which caused her to wake up short of breath.  Patient exacerbation hx: 1 ED visit 1.5 yrs ago, prompting inhaler starts, no visits since.   mMRC score= >2 CAT score= 18 See Documentation Flowsheet - CAT/COPD for complete symptom scoring.  See "scanned report" or Documentation Flowsheet (discrete results - PFTs) for Spirometry results. Patient provided good effort while attempting spirometry.   Lung Age = 89 Albuterol Neb Lot# [id number]     Exp. 03/2024  A/P: Patient has been experiencing SOB and "lots of breathing problems" for ~1.5 yrs, following ED visit. Reports taking Symbicort (budesonide-formoterol 80/4.5 mcg/act) daily "when she remembers" and albuterol inhaler as needed, does not use albuterol nebulizer. She did not believe the Symbicort was helping much. Spirometry evaluation reveals severe obstructive lung disease, Pt was shocked at reported lung age of > 100. Post nebulized albuterol tx revealed significant pre-post change, indicative of reversible obstructive lung disease. Spirometry [facility] based on CAT score and  multiple exacerbations per year for the last two years.  History of Tobacco Use disorder. Former smoker with 28 pack/year history, quit ~2 years ago.  Patient with good inhaler technique. Patient medication adherence less than ideal, uses Symbicort (formoterol/budesonide) infrequently, but adherent to all other medications.  -Start Trelegy Ellipta (fluticasone-umeclidinium-vilanterol 100-62.5-2.5 mcg/act) 1  inhalation daily -Discontinue Symbicort (budesonide-formoterol 80/4.5 mcg/act)  -Continue Albuterol 2 puffs PRN rescue -Educated patient on purpose, proper use, potential adverse effects including risk of esophageal candidiasis and need to rinse mouth after each use.    -Reviewed results of pulmonary function tests.  Pt verbalized understanding of results and education.    Written patient instructions provided.   Total time in face to face counseling 43 minutes.    Follow-up:  Pharmacist PRN - Asked patient to return for follow-up to either Pulm or with Dr. Laroy Apple in the next two months.  PCP clinic visit April 11, 2024 Patient seen with Shona Simpson, PharmD Candidate.

## 2023-09-28 NOTE — Telephone Encounter (Signed)
Rf's appropriate for Requip, Doxazosin and Cyclobenzaprine all filled 07/20/23. Divalproex filled 09/18/23 [place] 09/30/23; Lv 09/01/23

## 2023-09-28 NOTE — Assessment & Plan Note (Signed)
Patient has been experiencing SOB and "lots of breathing problems" for ~1.5 yrs, following ED visit. Reports taking Symbicort (budesonide-formoterol 80/4.5 mcg/act) daily "when she remembers" and albuterol inhaler as needed, does not use albuterol nebulizer. She did not believe the Symbicort was helping much. Spirometry evaluation reveals severe obstructive lung disease, Pt was shocked at reported lung age of > 100. Post nebulized albuterol tx revealed significant pre-post change, indicative of reversible obstructive lung disease. Spirometry [facility] based on CAT score and multiple exacerbations per year for the last two years.  History of Tobacco Use disorder. Former smoker with 28 pack/year history, quit ~2 years ago.  Patient with good inhaler technique. Patient medication adherence less than ideal, uses Symbicort (formoterol/budesonide) infrequently, but adherent to all other medications.  -Start Trelegy Ellipta (fluticasone-umeclidinium-vilanterol 100-62.5-2.5 mcg/act) 1 inhalation daily -Discontinue Symbicort (budesonide-formoterol 80/4.5 mcg/act)  -Continue Albuterol 2 puffs PRN rescue -Educated patient on purpose, proper use, potential adverse effects including risk of esophageal candidiasis and need to rinse mouth after each use.

## 2023-09-29 NOTE — Progress Notes (Signed)
Reviewed and agree with Dr Koval's plan.   

## 2023-09-30 ENCOUNTER — Encounter: Payer: Self-pay | Admitting: Psychiatry

## 2023-09-30 ENCOUNTER — Ambulatory Visit (INDEPENDENT_AMBULATORY_CARE_PROVIDER_SITE_OTHER): Payer: Medicare HMO | Admitting: Psychiatry

## 2023-09-30 DIAGNOSIS — F3132 Bipolar disorder, current episode depressed, moderate: Secondary | ICD-10-CM

## 2023-09-30 DIAGNOSIS — F515 Nightmare disorder: Secondary | ICD-10-CM

## 2023-09-30 DIAGNOSIS — F4310 Post-traumatic stress disorder, unspecified: Secondary | ICD-10-CM | POA: Diagnosis not present

## 2023-09-30 DIAGNOSIS — F99 Mental disorder, not otherwise specified: Secondary | ICD-10-CM

## 2023-09-30 DIAGNOSIS — F411 Generalized anxiety disorder: Secondary | ICD-10-CM | POA: Diagnosis not present

## 2023-09-30 DIAGNOSIS — F4312 Post-traumatic stress disorder, chronic: Secondary | ICD-10-CM

## 2023-09-30 DIAGNOSIS — F5105 Insomnia due to other mental disorder: Secondary | ICD-10-CM | POA: Diagnosis not present

## 2023-09-30 DIAGNOSIS — G2581 Restless legs syndrome: Secondary | ICD-10-CM | POA: Diagnosis not present

## 2023-09-30 MED ORDER — OXCARBAZEPINE 300 MG PO TABS: ORAL_TABLET | ORAL | 0 refills | Status: DC

## 2023-09-30 MED ORDER — ESZOPICLONE 3 MG PO TABS
3.0000 mg | ORAL_TABLET | Freq: Every day | ORAL | 3 refills | Status: DC
Start: 2023-09-30 — End: 2024-02-01

## 2023-09-30 MED ORDER — LORAZEPAM 1 MG PO TABS
1.0000 mg | ORAL_TABLET | Freq: Three times a day (TID) | ORAL | 1 refills | Status: DC | PRN
Start: 2023-09-30 — End: 2023-11-02

## 2023-09-30 NOTE — Patient Instructions (Signed)
Increase Trileptal 150 mg AM and nOOn and 600 mg HS If tolerated, in 2 weeks then reduce Depakote to 1 at night for 2 weeks and then stop it. Call if mood swings resume

## 2023-09-30 NOTE — Progress Notes (Signed)
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 04/21/2022 1049   ALKPHOS 78 04/21/2022 1049   BILITOT 0.4 04/21/2022 1049       Component Value Date/Time   WBC 8.7 04/21/2022 1049   WBC 5.7 09/18/2021 1125   RBC 4.64 04/21/2022 1049   RBC 3.89 09/18/2021 1125   HGB 14.5 04/21/2022 1049   HCT 42.6 04/21/2022 1049   PLT 338 04/21/2022 1049   MCV 92 04/21/2022 1049   MCH 31.3 04/21/2022 1049   MCH 32.1 09/18/2021 1125   MCHC 34.0 04/21/2022 1049   MCHC 33.6 09/18/2021 1125   RDW 12.2 04/21/2022 1049   LYMPHSABS 1,995 09/18/2021 1125   MONOABS 570 03/20/2017 1100   EOSABS 251 09/18/2021 1125   BASOSABS 91 09/18/2021 1125    No results found for: "POCLITH", "LITHIUM"   No results found for: "PHENYTOIN", "PHENOBARB", "VALPROATE", "CBMZ"   .res Assessment: Plan:    Flonnie was seen today for follow-up, depression, anxiety and manic behavior.  Diagnoses and all orders for this visit:  Bipolar disorder with moderate depression (HCC) -     Oxcarbazepine (TRILEPTAL) 300 MG tablet; Take 1/2  tablet AM and noon and 2 tablets at night  PTSD (post-traumatic stress disorder) -  Oxcarbazepine (TRILEPTAL) 300 MG tablet; Take 1/2 tablet AM and noon and 2 tablets at night -     LORazepam (ATIVAN) 1 MG tablet; Take 1 tablet (1 mg total) by mouth every 8 (eight) hours as needed for anxiety.  Generalized anxiety disorder -     Oxcarbazepine (TRILEPTAL) 300 MG tablet; Take 1/2 tablet AM and noon and 2 tablets at night -     LORazepam (ATIVAN) 1 MG tablet; Take 1 tablet (1 mg total) by mouth every 8 (eight) hours as needed for anxiety.  RLS (restless legs syndrome)  Insomnia due to other mental disorder -     Eszopiclone 3 MG TABS; Take 1 tablet (3 mg total) by mouth at bedtime. Take immediately before bedtime  Nightmares associated with chronic post-traumatic stress disorder   30 min face to face time with patient was spent on counseling and coordination of care. We discussed the following: Lizandra has treatment resistant bipolar disorder with psychotic features versus schizoaffective disorder and other psychiatric diagnoses as well.  She has been very difficult to treat in part because she is very sensitive to antipsychotics and yet has strong history of chronic auditory hallucinations.    She is chronically anxious and depressed.  Extensive history of psychiatric meds tried discussed with the patient.    Loss of dog caused dep but resolved when  Wellbutrin caused mania and had to be DCd.    Still taking with Depakote ER 1000 mg HS started during previous mania on steroids.    Discussed the very high dose of recurrent ropinirole and possible psychiatric side effects with that.   TR insomnia chronically.  But less awakening at present discussed Ambien amnesia and side effects. No change in sleep meds this visit RLS managed right now. RLS managed and neuro checked iron studies reportedly ok.  her auditory hallucinations have stopped.  But we can still consider if needed, Clozapine  option.       Consider off label Rexulti bc less likely to cause RLS than alternatives but she needs generics for cost reasons.  The increase to 6 mg HS doxazosin was successful at managing nightmares.   No  Caffeine after noon.  This is helped.   continue Gabapentin for off label anxiety and RLS at previous dosage.  400 BID and 800 pm.  This has been helpful though has not resolve the problem.   Discussed the risk of polypharmacy.  She is on multiple medications.  Disc purpose of each of the meds and consider changing some of them.  Undesirable polypharmacy but necessary bc chronic psych instability with history severe sx.she is benefitting and tolerating meds.  Counseled patient regarding potential benefits, risks, and side effects of Lamictal to include potential risk of Stevens-Johnson syndrome. Advised patient to stop taking Lamictal and contact office immediately if rash develops and to seek urgent medical attention if rash is severe and/or spreading quickly.  Lamictal 150  Continue cyclobenzaprine 20 mg nightly for RLS Continue Depakote 1000 mg nightly Continue doxazosin 6 mg nightly For RLS gabapentin 400 mg twice daily and 1200 mg nightly Continue lorazepam 1 mg 3 times daily 4 times daily ropinirole 2 mg at 3 PM and 8 mg nightly. High dose necessary to control RLS Reduced sertraline 50 mg daily DT mania Continue Lunesta 3 for longer duration vs Ambien.  Discussed side effects including amnesia.  For mood cycling disc Trileptal bc TR status and low wt gain risk.   Disc off label. Increase Trileptal  150 mg AM and nOOn and 600 mg HS If tolerated, in 2 weeks then reduce Depakote to 1 at night for 2 weeks and then stop it. Call if mood swings resume  Need counseling and started [facility].  . Previously Discussed safety plan at length with patient.  Advised patient to contact office with any worsening signs and symptoms.  Instructed patient to go to the  [facility] emergency room for evaluation if experiencing any acute safety concerns, to include suicidal intent.  Discussed potential metabolic side effects associated with atypical antipsychotics, as well as potential risk for movement side effects. Advised pt to contact office if movement side effects occur.   She agrees with the plan  Follow-up 1 mos  Meredith Staggers MD, DFAPA Please see After Visit Summary for patient specific instructions.  Future Appointments  Date Time Provider Department Center  10/27/2023  9:00 AM [name], MD [facility] None  11/02/2023  9:30 AM Cottle, Steva Ready., MD [facility] None  04/11/2024 11:00 AM [facility] ANNUAL WELLNESS VISIT [facility]       No orders of the defined types were placed in this encounter.    -------------------------------

## 2023-10-02 ENCOUNTER — Other Ambulatory Visit: Payer: Self-pay

## 2023-10-02 ENCOUNTER — Telehealth: Payer: Self-pay

## 2023-10-02 DIAGNOSIS — F4310 Post-traumatic stress disorder, unspecified: Secondary | ICD-10-CM

## 2023-10-02 DIAGNOSIS — G2581 Restless legs syndrome: Secondary | ICD-10-CM

## 2023-10-02 DIAGNOSIS — F4312 Post-traumatic stress disorder, chronic: Secondary | ICD-10-CM

## 2023-10-02 MED ORDER — ROPINIROLE HCL 4 MG PO TABS
8.0000 mg | ORAL_TABLET | Freq: Every day | ORAL | 0 refills | Status: DC
Start: 2023-10-02 — End: 2023-12-17

## 2023-10-02 MED ORDER — DOXAZOSIN MESYLATE 4 MG PO TABS
ORAL_TABLET | ORAL | 10 refills | Status: DC
Start: 2023-10-02 — End: 2024-05-11

## 2023-10-02 MED ORDER — CYCLOBENZAPRINE HCL 10 MG PO TABS
20.0000 mg | ORAL_TABLET | Freq: Every day | ORAL | 10 refills | Status: DC
Start: 2023-10-02 — End: 2024-10-07

## 2023-10-02 MED ORDER — ROPINIROLE HCL 2 MG PO TABS
ORAL_TABLET | ORAL | 0 refills | Status: DC
Start: 2023-10-02 — End: 2023-12-17

## 2023-10-06 ENCOUNTER — Telehealth: Payer: Self-pay | Admitting: Psychiatry

## 2023-10-06 NOTE — Telephone Encounter (Signed)
Pt lvm that the new medication that dr. Jennelle Human prescribed is not working. She is crying all the time and is very depressed. She doesn't know what to do. Please call her at [phone]

## 2023-10-07 NOTE — Telephone Encounter (Signed)
Yes as noted she just started the meds.  If the Treileptal dose seems too low bc more mood swings or dep, she can increase oxcarbazepine to 300 mg BID and 600 mg HS.  We are switching to something with less wt gain risk than Depakote, so that's the thing to keep in mind to help her be motivated to give it a chance.

## 2023-10-07 NOTE — Telephone Encounter (Signed)
Patient reporting increased crying and depression with Trileptal. She said sx started this week. Didn't know how long before she would start getting benefit. She was not tearful today and affect seemed normal.   From 10/16 visit:   Increase Trileptal 150 mg AM and nOOn and 600 mg HS If tolerated, in 2 weeks then reduce Depakote to 1 at night for 2 weeks and then stop it. Call if mood swings resume

## 2023-10-07 NOTE — Telephone Encounter (Signed)
Patient notified of recommendations. 

## 2023-10-09 ENCOUNTER — Other Ambulatory Visit: Payer: Self-pay | Admitting: Psychiatry

## 2023-10-09 DIAGNOSIS — F3132 Bipolar disorder, current episode depressed, moderate: Secondary | ICD-10-CM

## 2023-10-09 DIAGNOSIS — F4310 Post-traumatic stress disorder, unspecified: Secondary | ICD-10-CM

## 2023-10-09 DIAGNOSIS — F411 Generalized anxiety disorder: Secondary | ICD-10-CM

## 2023-10-27 ENCOUNTER — Ambulatory Visit: Payer: Medicare HMO | Admitting: Internal Medicine

## 2023-10-27 ENCOUNTER — Encounter: Payer: Self-pay | Admitting: Internal Medicine

## 2023-10-27 VITALS — BP 107/69 | HR 91 | Ht 67.0 in | Wt 183.0 lb

## 2023-10-27 DIAGNOSIS — J449 Chronic obstructive pulmonary disease, unspecified: Secondary | ICD-10-CM | POA: Diagnosis not present

## 2023-10-27 MED ORDER — BREZTRI AEROSPHERE 160-9-4.8 MCG/ACT IN AERO: 2.0000 | INHALATION_SPRAY | Freq: Two times a day (BID) | RESPIRATORY_TRACT | 11 refills | Status: DC

## 2023-10-27 NOTE — Patient Instructions (Signed)
Plan A = Automatic = Always=    change to Breztri Take 2 puffs first thing in am and then another 2 puffs about 12 hours later.    Work on inhaler technique:  relax and gently blow all the way out then take a nice smooth full deep breath back in, triggering the inhaler at same time you start breathing in.  Hold breath in for at least  5 seconds if you can. Blow out [employer]  thru nose. Rinse and gargle with water when done.  If mouth or throat bother you at all,  try brushing teeth/gums/tongue with arm and hammer toothpaste/ make a slurry and gargle and spit out.     Plan B = Backup (to supplement plan A, not to replace it) Only use your albuterol inhaler as a rescue medication to be used if you can't catch your breath by resting or doing a relaxed purse lip breathing pattern.  - The less you use it, the better it will work when you need it. - Ok to use the inhaler up to 2 puffs  every 4 hours if you must but call for appointment if use goes up over your usual need - Don't leave home without it !!  (think of it like the spare tire for your car)   Plan C = Crisis (instead of Plan B but only if Plan B stops working) - only use your albuterol nebulizer if you first try Plan B and it fails to help > ok to use the nebulizer up to every 4 hours but if start needing it regularly call for immediate appointment   If not satisfied with control of your symptoms, please call for allergy referral  Please schedule a follow up visit in 6 months but call sooner if needed  with all respiratory medications /inhalers/ solutions in hand so we can verify exactly what you are taking. This includes all medications from all doctors and over the counters

## 2023-10-27 NOTE — Progress Notes (Signed)
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 Asthma?       Had excellent ex tol and no need for any inhalers prior 03/2022 cxr at [facility] cone neg but rx as CAP and nebulizer turned things and never completely recovered maybe 75% never tried then acutely ill 01/30/23 severe cough > yellow mucus chest tightness sob  01/31/23 minute clinic pos type A flu rx tamiflu > overall 50% but no neb but continue to use inhaler.   History of Present Illness  02/06/2023  Pulmonary/ 1st office eval/[name]  Chief Complaint  Patient presents with   Consult    Pneumonia 04/2022.  Influenza 02/01/23. Chest tightness, cough and wheeze.  Sx resolved by next day.  Dyspnea:  with housecleaning uses saba  seems to help -not working out yet  Cough: better now but not gone > am only is still slt  yellow  Sleep: disrupted since 01/30/23 at least once a night / mucinex  SABA use: much less now  Rec Zpak  Plan A = Automatic = Always=    Symbicort 80 Take 2 puffs first thing in am and then another 2 puffs about 12 hours later.  Work on inhaler technique: Plan B = Backup (to supplement plan A, not to replace it) Only use your albuterol inhaler as a rescue medication  Plan C = Crisis (instead of Plan B but only if Plan B stops working) - only use your albuterol nebulizer if you first try Plan B  Also  Ok to try albuterol 15 min before an activity (on alternating days)  that you know would usually make you short of breath Try prilosec otc 20mg   Take 30-60 min before first meal of the day and Pepcid ac (famotidine) 20 mg one @  bedtime until cough is completely gone for at least a week without the need for cough suppression GERD diet reviewed, bed blocks rec  Please schedule a follow up office visit in 2 weeks, sooner if needed  with all  medications /inhalers/ solutions in hand     02/17/2023  f/u ov/[name] re: asthma/ doe    maint on  symbicort 80  2 bid / no meds/ using mints Chief Complaint  Patient presents with   Follow-up    Breathing has improved with symbicort. She was dx with flu 2 wks ago and has had prod cough with green sputum. She uses her albuterol inhaler about once per wk.   Dyspnea:  housecleaning  Cough: still coughing up green mucus sporadically during the day  Sleeping: on side flat bed one pillow no resp resp SABA use: none 02: none  Covid status:   vax x 3 not most recent Rec Augmentin 875 mg take one pill twice daily  X 10 days  Prednisone 10 mg take  4 each am x 2 days,   2 each am x 2 days,  1 each am x 2 days and stop Work on inhaler technique:    Also  Ok to try albuterol 15 min before an activity (on alternating days)  that you know would usually make you short of breath    10/27/2023  f/u ov/[place] office/[name] re: asthma changed  maint to trelegy 100  during acute flare on symbicort 160  with criteria for GOLD 2 copd during flare Chief Complaint  Patient presents with   Cough variant asthma  Dyspnea:  decline prior to flare with housecleaning /  Cough: feels due to PNDS  Sleeping: flat s resp cc  SABA use: none  02: none    No obvious day to day or daytime variability or assoc excess/ purulent sputum or mucus plugs or hemoptysis or cp or chest tightness, subjective wheeze or overt or hb symptoms.    Also denies any obvious fluctuation of symptoms with weather or environmental changes or other aggravating or alleviating factors except as outlined above   No unusual exposure hx or h/o childhood pna/ asthma or knowledge of premature birth.  Current Allergies, Complete Past Medical History, Past Surgical History, Family History, and Social History were reviewed in [employer] record.  ROS  The following are not active complaints unless bolded Hoarseness, sore  throat, dysphagia, dental problems, itching, sneezing,  nasal congestion or sensation of discharge of excess mucus or purulent secretions, ear ache,   fever, chills, sweats, unintended wt loss or wt gain, classically pleuritic or exertional cp,  orthopnea pnd or arm/hand swelling  or leg swelling, presyncope, palpitations, abdominal pain, anorexia, nausea, vomiting, diarrhea  or change in bowel habits or change in bladder habits, change in stools or change in urine, dysuria, hematuria,  rash, arthralgias, visual complaints, headache, numbness, weakness or ataxia or problems with walking or coordination,  change in mood or  memory.        Current Meds  Medication Sig   albuterol (PROVENTIL) (2.5 MG/3ML) 0.083% nebulizer solution USE THREE MILLILITERS VIA NEBULIZATION BY MOUTH EVERY 6 HOURS AS NEEDED FOR WHEEZING OR SHORTNESS OF BREATH   albuterol (VENTOLIN HFA) 108 (90 Base) MCG/ACT inhaler Inhale 2 puffs into the lungs every 6 (six) hours as needed for wheezing or shortness of breath.   azithromycin (ZITHROMAX) 500 MG tablet Take one tablet daily for 3 days   Budeson-Glycopyrrol-Formoterol (BREZTRI AEROSPHERE) 160-9-4.8 MCG/ACT AERO Inhale 2 puffs into the lungs 2 (two) times daily. Take 2 puffs first thing in am and then another 2 puffs about 12 hours later.   cyclobenzaprine (FLEXERIL) 10 MG tablet Take 2 tablets (20 mg total) by mouth at bedtime.   doxazosin (CARDURA) 4 MG tablet TAKE 1 AND 1/2 TABLETS AT BED TIME ( 6 MG TOTAL)   Eszopiclone 3 MG TABS Take 1 tablet (3 mg total) by mouth at bedtime. Take immediately before bedtime   fluticasone (FLONASE) 50 MCG/ACT nasal spray Place 2 sprays into both nostrils daily.   gabapentin (NEURONTIN) 400 MG capsule TAKE 1 CAPSULE TWICE DAILY AND TAKE 2 CAPSULES IN THE EVENING   lamoTRIgine (LAMICTAL) 150 MG tablet TAKE 1 TABLET EVERY DAY   LORazepam (ATIVAN) 1 MG tablet Take 1 tablet (1 mg total) by mouth every 8 (eight) hours as needed for anxiety.    Multiple Vitamin (MULTIVITAMIN) tablet Take 1 tablet by mouth daily.   Oxcarbazepine (TRILEPTAL) 300 MG tablet 1 tablet twice daily and 2 tablets at night   rOPINIRole (REQUIP) 2 MG tablet TAKE 1 TABLET BY MOUTH AT 3 PM   rOPINIRole (REQUIP) 4 MG tablet Take 2 tablets (8 mg total) by mouth at bedtime.   sertraline (ZOLOFT) 100 MG tablet Take 0.5 tablets (50 mg total) by mouth daily.   valACYclovir (VALTREX) 500 MG tablet Take one tablet by mouth twice daily for 3-5 days then daily as needed.   [DISCONTINUED] Fluticasone-Umeclidin-Vilant (TRELEGY  ELLIPTA) 100-62.5-25 MCG/ACT AEPB Inhale 1 Device into the lungs daily.   [DISCONTINUED] SYMBICORT 80-4.5 MCG/ACT inhaler Inhale 2 puffs into the lungs.               Objective:    Wts    10/27/2023    183   02/17/23 178 lb (80.7 kg)  02/06/23 173 lb 3.2 oz (78.6 kg)  01/20/23 170 lb (77.1 kg)    Vital signs reviewed  10/27/2023  - Note at rest 02 sats  94% on RA   General appearance:    amb wf clearing throat "allergies since on trelegy"      HEENT : Oropharynx  clear      Nasal turbinates nl    NECK :  without  apparent JVD/ palpable Nodes/TM    LUNGS: no acc muscle use,  Nl contour chest which is clear to A and P bilaterally without cough on insp or exp maneuvers   CV:  RRR  no s3 or murmur or increase in P2, and no edema   ABD:  soft and nontender with nl inspiratory excursion in the supine position. No bruits or organomegaly appreciated   MS:  Nl gait/ ext warm without deformities Or obvious joint restrictions  calf tenderness, cyanosis or clubbing    SKIN: warm and dry without lesions    NEURO:  alert, approp, nl sensorium with  no motor or cerebellar deficits apparent.         Assessment

## 2023-10-27 NOTE — Assessment & Plan Note (Signed)
Onset 04/2022 on a background of rhinitis worse in spring   all her life/ much worse since 2 /17/24   - 02/06/2023   try symb 80 2bid and max gerd rx  - 02/17/2023 improved noct symptoms but cc green mucus daytime and doe no better - 02/17/2023   Walked on RA  x  3  lap(s) =  approx 750  ft  @ fast pace, stopped due to end of study/ min sob with lowest 02 sats 96% - 02/17/2023    continue symbicort / gerd rx  - Spirometry  10/08/23  FEV1 1.90 (61%)  Ratio 0.66 p saba and mildly concave FV loop  - 10/27/2023  After extensive coaching inhaler device,  effectiveness =    80% > change to breztri   Clinically her copd appears to be mild and note the pfts were doing an obvious Asthma/rhintis flare with remaining issue now - upper airway coughing / throat clearing which are either related to AB or Upper airway cough syndrome (previously labeled PNDS),  is so named because it's frequently impossible to sort out how much is  CR/sinusitis with freq throat clearing (which can be related to primary GERD)   vs  causing  secondary (" extra esophageal")  GERD from wide swings in gastric pressure that occur with throat clearing, often  promoting self use of mint and menthol lozenges that reduce the lower esophageal sphincter tone and exacerbate the problem further in a cyclical fashion.   These are the same pts (now being labeled as having "irritable larynx syndrome" by some cough centers) who not infrequently have a history of having failed to tolerate ace inhibitors,  dry powder inhalers or biphosphonates or report having atypical/extraesophageal reflux symptoms that don't respond to standard doses of PPI  and are easily confused as having aecopd or asthma flares by even experienced allergists/ pulmonologists (myself included).   Rec: Avoid dpi in favor of breztri 2bid trial and if not better low threshold for allergy referral  Otherwise f/u here in 6 months as this will bring [place] to spring, historically her worst season.           Each maintenance medication was reviewed in detail including emphasizing most importantly the difference between maintenance and prns and under what circumstances the prns are to be triggered using an action plan format where appropriate.  Total time for H and P, chart review, counseling, reviewing hfa device(s) and generating customized AVS unique to this office visit / same day charting > 40 min for multiple  refractory respiratory  symptoms of uncertain etiology

## 2023-11-02 ENCOUNTER — Encounter: Payer: Self-pay | Admitting: Psychiatry

## 2023-11-02 ENCOUNTER — Ambulatory Visit: Payer: Medicare HMO | Admitting: Psychiatry

## 2023-11-02 DIAGNOSIS — F4310 Post-traumatic stress disorder, unspecified: Secondary | ICD-10-CM | POA: Diagnosis not present

## 2023-11-02 DIAGNOSIS — F411 Generalized anxiety disorder: Secondary | ICD-10-CM

## 2023-11-02 DIAGNOSIS — F3132 Bipolar disorder, current episode depressed, moderate: Secondary | ICD-10-CM

## 2023-11-02 MED ORDER — DIVALPROEX SODIUM ER 500 MG PO TB24
ORAL_TABLET | ORAL | 0 refills | Status: DC
Start: 2023-11-02 — End: 2024-02-02

## 2023-11-02 MED ORDER — LORAZEPAM 1 MG PO TABS: ORAL_TABLET | ORAL | 1 refills | Status: DC

## 2023-11-02 NOTE — Progress Notes (Signed)
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 vitals taken for this visit.  Physical Exam Constitutional:      General: She is not in acute distress. Musculoskeletal:        General: No deformity.  Neurological:     Mental Status: She is alert and oriented to person, place, and time.     Coordination: Coordination normal.  Psychiatric:        Attention and Perception: Attention and perception normal. She does not perceive auditory or visual hallucinations.        Mood and Affect: Mood is anxious. Mood is not depressed or elated. Affect is not labile, angry, tearful or inappropriate.        Speech: Speech normal. Speech is not rapid and pressured.        Behavior: Behavior normal.        Thought Content: Thought content is not paranoid or delusional. Thought content does not include homicidal or suicidal ideation. Thought content does not include suicidal plan.        Cognition and Memory: Cognition and memory normal.        Judgment: Judgment normal.     Comments: Insight intact No AIM  manic tension and irritablity      Lab  Review:     Component Value Date/Time   NA 137 04/21/2022 1049   K 4.4 04/21/2022 1049   CL 103 04/21/2022 1049  CO2 20 04/21/2022 1049   GLUCOSE 91 04/21/2022 1049   GLUCOSE 87 09/18/2021 1125   BUN 10 04/21/2022 1049   CREATININE 0.77 04/21/2022 1049   CREATININE 0.67 09/18/2021 1125   CALCIUM 9.6 04/21/2022 1049   PROT 6.5 04/21/2022 1049   ALBUMIN 4.6 04/21/2022 1049   AST 19 04/21/2022 1049   ALT 10 04/21/2022 1049   ALKPHOS 78 04/21/2022 1049   BILITOT 0.4 04/21/2022 1049       Component Value Date/Time   WBC 8.7 04/21/2022 1049   WBC 5.7 09/18/2021 1125   RBC 4.64 04/21/2022 1049   RBC 3.89 09/18/2021 1125   HGB 14.5 04/21/2022 1049   HCT 42.6 04/21/2022 1049   PLT 338 04/21/2022 1049   MCV 92 04/21/2022 1049   MCH 31.3 04/21/2022 1049   MCH 32.1 09/18/2021 1125   MCHC 34.0 04/21/2022 1049   MCHC 33.6 09/18/2021 1125   RDW 12.2 04/21/2022 1049   LYMPHSABS 1,995 09/18/2021 1125   MONOABS 570 03/20/2017 1100   EOSABS 251 09/18/2021 1125   BASOSABS 91 09/18/2021 1125    No results found for: "POCLITH", "LITHIUM"   No results found for: "PHENYTOIN", "PHENOBARB", "VALPROATE", "CBMZ"   .res Assessment: Plan:    Lyle was seen today for follow-up, depression, anxiety and manic behavior.  Diagnoses and all orders for this visit:  Bipolar disorder with moderate depression (HCC) -     divalproex (DEPAKOTE ER) 500 MG 24 hr tablet; 2 tablets at night  PTSD (post-traumatic stress disorder) -     LORazepam (ATIVAN) 1 MG tablet; 1 tablet twice daily and 2 tablets at night  Generalized anxiety disorder -     LORazepam (ATIVAN) 1 MG tablet; 1 tablet twice daily and 2 tablets at night    30 min face to face time with patient was spent on counseling and coordination of care. We discussed the following: Halyn has treatment resistant bipolar disorder with psychotic features versus schizoaffective disorder and other psychiatric diagnoses as well.  She has been very  difficult to treat in part because she is very sensitive to antipsychotics and yet has strong history of chronic auditory hallucinations.    She is chronically anxious and depressed.  Chronic mood cycline is typical. Extensive history of psychiatric meds tried discussed with the patient.    Loss of dog caused dep but resolved when  Wellbutrin caused mania and had to be DCd.    More manic switch Depakote 1000 nightly to oxcarbazepine 300 every morning and 600 mg nightly.  We discussed that this could be a function of the oxcarbazepine dose being too low for her.  Is not having side effects.  However there is no guarantee that increasing the dose will solve the problem.  We discussed the option of returning to Depakote versus increasing oxcarbazepine.  She asked about Latuda but she has been on it multiple times without sustained benefit and tolerability.  She agrees to return back to Depakote for faster response and more likely response.  However we can consider oxcarbazepine switch again as long as we go to a higher dose.  Discussed the very high dose of recurrent ropinirole and possible psychiatric side effects with that.   TR insomnia chronically.  But less awakening at present discussed Ambien amnesia and side effects. No change in sleep meds this visit RLS managed right now. RLS managed and neuro checked iron studies reportedly ok.  her auditory hallucinations have stopped.  But we can still consider  if needed, Clozapine option.       Consider off label Rexulti bc less likely to cause RLS than alternatives but she needs generics for cost reasons.  The increase to 6 mg HS doxazosin was successful at managing nightmares.   No  Caffeine after noon.  This is helped.   continue Gabapentin for off label anxiety and RLS at previous dosage.  400 BID and 800 pm.  This has been helpful though has not resolve the problem.   Discussed the risk of polypharmacy.  She is on multiple medications.  Disc  purpose of each of the meds and consider changing some of them.  Undesirable polypharmacy but necessary bc chronic psych instability with history severe sx.she is benefitting and tolerating meds.  Counseled patient regarding potential benefits, risks, and side effects of Lamictal to include potential risk of Stevens-Johnson syndrome. Advised patient to stop taking Lamictal and contact office immediately if rash develops and to seek urgent medical attention if rash is severe and/or spreading quickly.  Lamictal 150  Continue cyclobenzaprine 20 mg nightly for RLS Continue doxazosin 6 mg nightly For RLS gabapentin 400 mg twice daily and 1200 mg nightly Continue lorazepam 1 mg BID  and 2 tabs HS ropinirole 2 mg at 3 PM and 8 mg nightly. High dose necessary to control RLS Reduced sertraline 50 mg daily DT mania Continue Lunesta 3 for longer duration vs Ambien.  Discussed side effects including amnesia.  Need counseling and started Lowella Dandy at [facility].  . Previously Discussed safety plan at length with patient.  Advised patient to contact office with any worsening signs and symptoms.  Instructed patient to go to the [facility] emergency room for evaluation if experiencing any acute safety concerns, to include suicidal intent.  Discussed potential metabolic side effects associated with atypical antipsychotics, as well as potential risk for movement side effects. Advised pt to contact office if movement side effects occur.   Med changes: Start Depakote 1 nightly and reduce oxcarbazepine to 1 twice daily for 2 days, Then stop oxcarbazepine and increase Depakote to 2 nightly. Increase lorazepam back to 1 mg BID and 2 mg HS.  Taken it for years.  She agrees with the plan  Follow-up 1 mos  Meredith Staggers MD, DFAPA Please see After Visit Summary for patient specific instructions.  Future Appointments  Date Time Provider Department Center  12/01/2023 10:30 AM Cottle, Steva Ready., MD  [facility] None  04/11/2024 11:00 AM [facility] ANNUAL WELLNESS VISIT [facility]       No orders of the defined types were placed in this encounter.    -------------------------------

## 2023-11-02 NOTE — Patient Instructions (Signed)
Start Depakote 1 nightly and reduce oxcarbazepine to 1 twice daily for 2 days, Then stop oxcarbazepine and increase Depakote to 2 nightly.

## 2023-11-08 ENCOUNTER — Other Ambulatory Visit: Payer: Self-pay | Admitting: Psychiatry

## 2023-11-08 DIAGNOSIS — F315 Bipolar disorder, current episode depressed, severe, with psychotic features: Secondary | ICD-10-CM

## 2023-11-29 ENCOUNTER — Other Ambulatory Visit: Payer: Self-pay | Admitting: Psychiatry

## 2023-11-30 NOTE — Telephone Encounter (Signed)
Appt tomorrow.

## 2023-11-30 NOTE — Telephone Encounter (Signed)
Pt has appt tomorrow 12/17

## 2023-12-01 ENCOUNTER — Encounter: Payer: Self-pay | Admitting: Psychiatry

## 2023-12-01 ENCOUNTER — Ambulatory Visit (INDEPENDENT_AMBULATORY_CARE_PROVIDER_SITE_OTHER): Payer: Medicare HMO | Admitting: Psychiatry

## 2023-12-01 DIAGNOSIS — F515 Nightmare disorder: Secondary | ICD-10-CM | POA: Diagnosis not present

## 2023-12-01 DIAGNOSIS — F411 Generalized anxiety disorder: Secondary | ICD-10-CM

## 2023-12-01 DIAGNOSIS — F4312 Post-traumatic stress disorder, chronic: Secondary | ICD-10-CM | POA: Diagnosis not present

## 2023-12-01 DIAGNOSIS — F4310 Post-traumatic stress disorder, unspecified: Secondary | ICD-10-CM

## 2023-12-01 DIAGNOSIS — F5105 Insomnia due to other mental disorder: Secondary | ICD-10-CM

## 2023-12-01 DIAGNOSIS — F99 Mental disorder, not otherwise specified: Secondary | ICD-10-CM

## 2023-12-01 DIAGNOSIS — F3132 Bipolar disorder, current episode depressed, moderate: Secondary | ICD-10-CM

## 2023-12-01 DIAGNOSIS — G2581 Restless legs syndrome: Secondary | ICD-10-CM | POA: Diagnosis not present

## 2023-12-01 MED ORDER — LURASIDONE HCL 80 MG PO TABS: ORAL_TABLET | ORAL | 0 refills | Status: DC

## 2023-12-01 NOTE — Progress Notes (Signed)
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 Family: More than three times a week    Frequency of Social Gatherings with Friends and Family: Never    Attends Religious Services: More than 4 times per year    Active Member of [employer] or Organizations: Yes    Attends Banker Meetings: Never    Marital Status: Married  Catering manager Violence: Not At Risk (03/09/2023)   Humiliation, Afraid, Rape, and Kick questionnaire    Fear of Current or Ex-Partner: No    Emotionally Abused: No    Physically Abused: No    Sexually Abused: No    Past Medical History, Surgical history, Social history, and Family history were reviewed and updated as appropriate.   Please see review of systems for further details on the patient's review from today.   Objective:   Physical Exam:  There were no vitals taken for this visit.  Physical Exam Constitutional:      General: She is not in acute distress. Musculoskeletal:        General: No deformity.  Neurological:     Mental Status: She is  alert and oriented to person, place, and time.     Coordination: Coordination normal.  Psychiatric:        Attention and Perception: Attention and perception normal. She does not perceive auditory or visual hallucinations.        Mood and  Affect: Mood is anxious. Mood is not depressed or elated. Affect is not labile, angry, tearful or inappropriate.        Speech: Speech normal. Speech is not rapid and pressured.        Behavior: Behavior normal.        Thought Content: Thought content is not paranoid or delusional. Thought content does not include homicidal or suicidal ideation. Thought content does not include suicidal plan.        Cognition and Memory: Cognition and memory normal.        Judgment: Judgment normal.     Comments: Insight intact No AIM  manic tension and irritablity      Lab Review:     Component Value Date/Time   NA 137 04/21/2022 1049   K 4.4 04/21/2022 1049   CL 103 04/21/2022 1049   CO2 20 04/21/2022 1049   GLUCOSE 91 04/21/2022 1049   GLUCOSE 87 09/18/2021 1125   BUN 10 04/21/2022 1049   CREATININE 0.77 04/21/2022 1049   CREATININE 0.67 09/18/2021 1125   CALCIUM 9.6 04/21/2022 1049   PROT 6.5 04/21/2022 1049   ALBUMIN 4.6 04/21/2022 1049   AST 19 04/21/2022 1049   ALT 10 04/21/2022 1049   ALKPHOS 78 04/21/2022 1049   BILITOT 0.4 04/21/2022 1049       Component Value Date/Time   WBC 8.7 04/21/2022 1049   WBC 5.7 09/18/2021 1125   RBC 4.64 04/21/2022 1049   RBC 3.89 09/18/2021 1125   HGB 14.5 04/21/2022 1049   HCT 42.6 04/21/2022 1049   PLT 338 04/21/2022 1049   MCV 92 04/21/2022 1049   MCH 31.3 04/21/2022 1049   MCH 32.1 09/18/2021 1125   MCHC 34.0 04/21/2022 1049   MCHC 33.6 09/18/2021 1125   RDW 12.2 04/21/2022 1049   LYMPHSABS 1,995 09/18/2021 1125   MONOABS 570 03/20/2017 1100   EOSABS 251 09/18/2021 1125   BASOSABS 91 09/18/2021 1125    No results found for: "POCLITH", "LITHIUM"   No results found for: "PHENYTOIN", "PHENOBARB",  "VALPROATE", "CBMZ"   .res Assessment: Plan:    There are no diagnoses linked to this encounter.   30 min face to face time with patient was spent on counseling and coordination of care. We discussed the following: Beth [name] has treatment resistant bipolar disorder with psychotic features versus schizoaffective disorder and other psychiatric diagnoses as well.  She has been very difficult to treat in part because she is very sensitive to antipsychotics and yet has strong history of chronic auditory hallucinations.    She is chronically anxious and depressed.  Chronic mood cycline is typical. Extensive history of psychiatric meds tried discussed with the patient.    Discussed the very high dose of recurrent ropinirole and possible psychiatric side effects with that.   TR insomnia chronically.  But less awakening at present discussed Ambien amnesia and side effects. No change in sleep meds this visit RLS managed right now. RLS managed and neuro checked iron studies reportedly ok.  her auditory hallucinations have stopped.  But we can still consider if needed, Clozapine option.       Consider off label Rexulti bc less likely to cause RLS than alternatives but she needs generics for cost reasons.  The increase to 6 mg HS doxazosin was successful at managing nightmares.   No  Caffeine after noon.  This is helped.   continue Gabapentin for off label anxiety and RLS at previous dosage.  400 BID and 800  pm.  This has been helpful though has not resolve the problem.   Discussed the risk of polypharmacy.  She is on multiple medications.  Disc purpose of each of the meds and consider changing some of them.  Undesirable polypharmacy but necessary bc chronic psych instability with history severe sx.she is benefitting and tolerating meds.  Counseled patient regarding potential benefits, risks, and side effects of Lamictal to include potential risk of Stevens-Johnson syndrome. Advised patient to stop taking  Lamictal and contact office immediately if rash develops and to seek urgent medical attention if rash is severe and/or spreading quickly.  Lamictal 150  Continue cyclobenzaprine 20 mg nightly for RLS Continue doxazosin 6 mg nightly For RLS gabapentin 400 mg twice daily and 1200 mg nightly Continue lorazepam 1 mg BID  and 2 tabs HS ropinirole 2 mg at 3 PM and 8 mg nightly. High dose necessary to control RLS Reduced sertraline 50 mg daily DT mania Continue Lunesta 3 for longer duration vs Ambien.  Discussed side effects including amnesia.  Need counseling and started Lowella Dandy at [facility].  . Previously Discussed safety plan at length with patient.  Advised patient to contact office with any worsening signs and symptoms.  Instructed patient to go to the [facility] emergency room for evaluation if experiencing any acute safety concerns, to include suicidal intent.  Discussed potential metabolic side effects associated with atypical antipsychotics, as well as potential risk for movement side effects. Advised pt to contact office if movement side effects occur.   Med changes: wean Depakote DT wt gain Retry Latuda per her request and increase from 40 to 80 over a week.  She agrees with the plan  Follow-up 1 mos  Meredith Staggers MD, DFAPA Please see After Visit Summary for patient specific instructions.  Future Appointments  Date Time Provider Department Center  02/02/2024 11:30 AM [name] A, NP [facility] None  04/11/2024 11:00 AM [facility] ANNUAL WELLNESS VISIT [facility]       No orders of the defined types were placed in this encounter.    -------------------------------

## 2023-12-02 ENCOUNTER — Other Ambulatory Visit: Payer: Self-pay | Admitting: Psychiatry

## 2023-12-02 DIAGNOSIS — F315 Bipolar disorder, current episode depressed, severe, with psychotic features: Secondary | ICD-10-CM

## 2023-12-17 ENCOUNTER — Other Ambulatory Visit: Payer: Self-pay | Admitting: Psychiatry

## 2023-12-17 DIAGNOSIS — F411 Generalized anxiety disorder: Secondary | ICD-10-CM

## 2023-12-17 DIAGNOSIS — G2581 Restless legs syndrome: Secondary | ICD-10-CM

## 2023-12-17 DIAGNOSIS — F4310 Post-traumatic stress disorder, unspecified: Secondary | ICD-10-CM

## 2023-12-17 DIAGNOSIS — F315 Bipolar disorder, current episode depressed, severe, with psychotic features: Secondary | ICD-10-CM

## 2023-12-29 ENCOUNTER — Other Ambulatory Visit: Payer: Self-pay | Admitting: Psychiatry

## 2023-12-29 DIAGNOSIS — F4310 Post-traumatic stress disorder, unspecified: Secondary | ICD-10-CM

## 2023-12-29 DIAGNOSIS — F411 Generalized anxiety disorder: Secondary | ICD-10-CM

## 2023-12-30 ENCOUNTER — Telehealth: Payer: Self-pay

## 2023-12-30 ENCOUNTER — Ambulatory Visit: Payer: Medicare HMO

## 2023-12-30 VITALS — BP 118/72 | HR 79 | Temp 97.7°F | Ht 67.0 in | Wt 173.2 lb

## 2023-12-30 DIAGNOSIS — J449 Chronic obstructive pulmonary disease, unspecified: Secondary | ICD-10-CM | POA: Diagnosis not present

## 2023-12-30 DIAGNOSIS — J45991 Cough variant asthma: Secondary | ICD-10-CM | POA: Diagnosis not present

## 2023-12-30 DIAGNOSIS — R0981 Nasal congestion: Secondary | ICD-10-CM

## 2023-12-30 LAB — POC SOFIA 2 FLU + SARS ANTIGEN FIA
Influenza A, POC: NEGATIVE
Influenza B, POC: NEGATIVE
SARS Coronavirus 2 Ag: NEGATIVE

## 2023-12-30 MED ORDER — DOXYCYCLINE HYCLATE 100 MG PO TABS: 100.0000 mg | ORAL_TABLET | Freq: Two times a day (BID) | ORAL | 0 refills | Status: AC

## 2023-12-30 MED ORDER — PREDNISONE 50 MG PO TABS: 50.0000 mg | ORAL_TABLET | Freq: Every day | ORAL | 0 refills | Status: AC

## 2023-12-30 NOTE — Assessment & Plan Note (Signed)
 2-3/3 cardinal symptoms with new sputum production and change in color.  Likely viral insult causing exacerbation of COPD/cough variant asthma.  Mild to moderate exacerbation, will treat with Doxy x 5 days and prednisone  x 3 days.  Continue with Breztri  as controller and albuterol  as rescue inhaler.  Strict return precautions, maintain hydration as able.  No evidence of pneumonia at this time given hemodynamically stable and lung sounds without focal diminishment.  Likely has postnasal drip as a contributor.  If worsening, give us  a call or contact pulmonologist for further discussion.  Can continue with over-the-counter cough medicine as needed if does not interact with her medications. Flu and COVID ordered

## 2023-12-30 NOTE — Telephone Encounter (Signed)
 Received call from pharmacy regarding prednisone  prescription.   They need clarification as to if patient is supposed to be taking for 3 days vs 5 days, as rx has both sets of directions.    Spoke with Dr. Eveleen Hinds. She states that patient should be taking for 3 days.   Provided verbal clarification to pharmacist.   Elsie Halo, RN

## 2023-12-30 NOTE — Patient Instructions (Addendum)
 It was great to see you today! Thank you for choosing [facility] for your primary care.  Today we addressed: Please continue with doxycycline  WITH FOOD x5 days twice a day Prednisone  50 mg x3 days  Over the counter cough medication for cough  If worsening, please return or contact pulmonologist  Continue with Breztri  and Albuterol    If you haven't already, sign up for My Chart to have easy access to your labs results, and communication with your primary care physician. I recommend that you always bring your medications to each appointment as this makes it easy to ensure you are on the correct medications and helps us  not miss refills when you need them. Call the clinic at [phone] if your symptoms worsen or you have any concerns.  Please arrive 15 minutes before your appointment to ensure smooth check in process.  We appreciate your efforts in making this happen.  Thank you for allowing me to participate in your care, Ernestina Headland, MD 12/30/2023, 8:46 AM PGY-3, [facility]

## 2023-12-30 NOTE — Progress Notes (Signed)
     SUBJECTIVE:   CHIEF COMPLAINT / HPI:   Cough:  Patient complains of cough, coughing up yellow sputum. Intermittent dyspnea with cough  Symptoms began 3 days ago.  Mom had sinus infection last week as well  The cough is productive of yellow sputum (at baseline does not cough up as much sputum and sputum color has changed), with shortness of breath during the cough.  Associated symptoms include:sputum production.  Patient does have a history of asthma.  Patient does have a history of environmental allergens.  Has history of COPD Gold 2? Cough Variant Asthma followed with pulm--on Breztri  since 10/27/23 Not on abx recently   PERTINENT  PMH / PSH:  Mild COPD  - follows w/ pulm on Breztri   Cough Variant Asthma - Breztri   Allergies  PNDS - Flonase   Treatment Resistant depression  Bipolar Disorder: Psych meds per last psych note: doxazosin  6, Lunesta  3, gabapentin  400 mg BID and 1200 mg PM, lamotrigine  150, lorazepam  1 mg BID and 2 tabs HS, ropinirole  2 mg at 3 PM and 8 mg nightly, sertraline  50, oxcarbazepine  300 mg tablets 1 every morning and 2 nightly    OBJECTIVE:   BP 118/72   Pulse 79   Temp 97.7 F (36.5 C)   Ht 5\' 7"  (1.702 m)   Wt 173 lb 3.2 oz (78.6 kg)   LMP 12/13/2023   SpO2 99%   BMI 27.13 kg/m   General: Alert and oriented in no apparent distress Heart: Regular rate and rhythm with no murmurs appreciated Lungs: coarse throughout, no wheezing, no focal diminishment or increased WOB  Abdomen: Bowel sounds present, no abdominal pain Skin: Warm and dry   ASSESSMENT/PLAN:   Assessment & Plan COPD  likely GOLD 2/ AB 2-3/3 cardinal symptoms with new sputum production and change in color.  Likely viral insult causing exacerbation of COPD/cough variant asthma.  Mild to moderate exacerbation, will treat with Doxy x 5 days and prednisone  x 3 days.  Continue with Breztri  as controller and albuterol  as rescue inhaler.  Strict return precautions, maintain hydration as  able.  No evidence of pneumonia at this time given hemodynamically stable and lung sounds without focal diminishment.  Likely has postnasal drip as a contributor.  If worsening, give us  a call or contact pulmonologist for further discussion.  Can continue with over-the-counter cough medicine as needed if does not interact with her medications. Flu and COVID ordered      Ernestina Headland, MD [facility]

## 2024-01-03 ENCOUNTER — Other Ambulatory Visit: Payer: Self-pay | Admitting: Psychiatry

## 2024-01-03 DIAGNOSIS — F4310 Post-traumatic stress disorder, unspecified: Secondary | ICD-10-CM

## 2024-01-03 DIAGNOSIS — F411 Generalized anxiety disorder: Secondary | ICD-10-CM

## 2024-01-03 DIAGNOSIS — F3132 Bipolar disorder, current episode depressed, moderate: Secondary | ICD-10-CM

## 2024-01-12 ENCOUNTER — Encounter: Payer: Self-pay | Admitting: Nurse Practitioner

## 2024-01-12 NOTE — Telephone Encounter (Signed)
Needs OV.

## 2024-01-13 NOTE — Telephone Encounter (Signed)
Pt scheduled for OV on 01/14/2024. Encounter closed.

## 2024-01-14 ENCOUNTER — Encounter: Payer: Self-pay | Admitting: Nurse Practitioner

## 2024-01-14 ENCOUNTER — Ambulatory Visit (INDEPENDENT_AMBULATORY_CARE_PROVIDER_SITE_OTHER): Payer: Medicare HMO | Admitting: Nurse Practitioner

## 2024-01-14 VITALS — BP 104/68 | HR 86 | Ht 66.14 in | Wt 184.0 lb

## 2024-01-14 DIAGNOSIS — N946 Dysmenorrhea, unspecified: Secondary | ICD-10-CM

## 2024-01-14 DIAGNOSIS — N920 Excessive and frequent menstruation with regular cycle: Secondary | ICD-10-CM

## 2024-01-14 MED ORDER — NORETHIN ACE-ETH ESTRAD-FE 1-20 MG-MCG PO TABS: 1.0000 | ORAL_TABLET | Freq: Every day | ORAL | 0 refills | Status: DC

## 2024-01-14 NOTE — Progress Notes (Signed)
   Acute Office Visit  Subjective:    Patient ID: Beth [name], female    DOB: 1976-11-10, 48 y.o.   MRN: [id number]   HPI 48 y.o. presents today for heavy, painful periods. Cramps are unbearable and affecting her daily life. Also has back pain. Using super tampons. Has tried heat, OTC pain meds with no relief. Cycles are monthly. Prescribed POPs November 2023 for management but had irregular bleeding, so she discontinued. Wants hysterectomy. Sister had hysterectomy for heavy periods.   Patient's last menstrual period was 12/13/2023.    Review of Systems  Constitutional: Negative.   Genitourinary:  Positive for menstrual problem and pelvic pain.       Objective:    Physical Exam Constitutional:      Appearance: Normal appearance.   GU: Not indicated  BP 104/68 (BP Location: Right Arm, Patient Position: Sitting)   Pulse 86   Ht 5' 6.14" (1.68 m)   Wt 184 lb (83.5 kg)   LMP 12/13/2023   SpO2 96%   BMI 29.57 kg/m  Wt Readings from Last 3 Encounters:  01/14/24 184 lb (83.5 kg)  12/30/23 173 lb 3.2 oz (78.6 kg)  10/27/23 183 lb (83 kg)        Assessment & Plan:   Problem List Items Addressed This Visit   None Visit Diagnoses       Dysmenorrhea    -  Primary   Relevant Medications   norethindrone-ethinyl estradiol-FE (LOESTRIN FE) 1-20 [facility] tablet   Other Relevant Orders   US PELVIS TRANSVAGINAL NON-OB (TV ONLY)     Menorrhagia with regular cycle       Relevant Medications   norethindrone-ethinyl estradiol-FE (LOESTRIN FE) 1-20 [facility] tablet   Other Relevant Orders   US PELVIS TRANSVAGINAL NON-OB (TV ONLY)      Plan: Will schedule ultrasound. Would like consult with surgeon to discuss hysterectomy after ultrasound completed. Will try low dose COCs for management in the interim. Ibuprofen 800 mg every 8 hours for cramping.     [name] DNP, 8:51 AM 01/14/2024

## 2024-01-26 ENCOUNTER — Encounter: Payer: Self-pay | Admitting: Nurse Practitioner

## 2024-01-31 ENCOUNTER — Other Ambulatory Visit: Payer: Self-pay | Admitting: Psychiatry

## 2024-01-31 DIAGNOSIS — F5105 Insomnia due to other mental disorder: Secondary | ICD-10-CM

## 2024-02-02 ENCOUNTER — Encounter: Payer: Self-pay | Admitting: Nurse Practitioner

## 2024-02-02 ENCOUNTER — Ambulatory Visit (INDEPENDENT_AMBULATORY_CARE_PROVIDER_SITE_OTHER): Payer: Medicare HMO

## 2024-02-02 ENCOUNTER — Ambulatory Visit: Payer: Medicare HMO | Admitting: Nurse Practitioner

## 2024-02-02 VITALS — BP 126/78 | HR 118 | Ht 66.25 in | Wt 177.0 lb

## 2024-02-02 DIAGNOSIS — Z9189 Other specified personal risk factors, not elsewhere classified: Secondary | ICD-10-CM | POA: Diagnosis not present

## 2024-02-02 DIAGNOSIS — N946 Dysmenorrhea, unspecified: Secondary | ICD-10-CM

## 2024-02-02 DIAGNOSIS — N920 Excessive and frequent menstruation with regular cycle: Secondary | ICD-10-CM

## 2024-02-02 DIAGNOSIS — Z01419 Encounter for gynecological examination (general) (routine) without abnormal findings: Secondary | ICD-10-CM

## 2024-02-02 DIAGNOSIS — Z1331 Encounter for screening for depression: Secondary | ICD-10-CM

## 2024-02-02 DIAGNOSIS — A609 Anogenital herpesviral infection, unspecified: Secondary | ICD-10-CM | POA: Diagnosis not present

## 2024-02-02 MED ORDER — VALACYCLOVIR HCL 500 MG PO TABS: ORAL_TABLET | ORAL | 1 refills | Status: AC

## 2024-02-02 NOTE — Progress Notes (Signed)
 Beth [name] 1976/03/31 [id number]   History:  48 y.o. G0 presents for breast and pelvic exam and ultrasound consult. Seen 01/14/2024 for heavy, painful periods. Cramps are unbearable and affecting her daily life. Also has back pain. Using super tampons. Has tried heat, OTC pain meds with no relief. Cycles are monthly. Prescribed POPs November 2023 for management but had irregular bleeding, so she discontinued. Wants hysterectomy. Sister had hysterectomy for heavy periods. COCs provided in the interim, recommend Ibuprofen 800 mg every 8 hours for cramping. Normal pap and mammogram history. History of bipolar disorder, HSV - occasional outbreaks.  Gynecologic History Patient's last menstrual period was 01/11/2024 (exact date). Period Cycle (Days):  (3-4) Period Duration (Days): 29-30 Menstrual Flow: Heavy Menstrual Control: Maxi pad, Tampon Dysmenorrhea: (!) Severe Dysmenorrhea Symptoms: Cramping Contraception/Family planning: OCP (estrogen/progesterone) Sexually active: Yes  Health Maintenance Last Pap: 09/18/2021. Results were: Normal neg HPV Last mammogram: 09/26/2023. Results were: Normal Last colonoscopy: 06/19/2022. Results were: Tubular adenoma, 7-year recall Last Dexa: Not indicated  Past medical history, past surgical history, family history and social history were all reviewed and documented in the EPIC chart. Married. On disability.   ROS:  A ROS was performed and pertinent positives and negatives are included.  Exam:  Vitals:   02/02/24 1114  BP: 126/78  Pulse: (!) 118  SpO2: 96%  Weight: 177 lb (80.3 kg)  Height: 5' 6.25" (1.683 m)     Body mass index is 28.35 kg/m.  General appearance:  Normal Thyroid:  Symmetrical, normal in size, without palpable masses or nodularity. Respiratory  Auscultation:  Clear without wheezing or rhonchi Cardiovascular  Auscultation:  Regular rate, without rubs, murmurs or gallops  Edema/varicosities:  Not grossly  evident Abdominal  Soft,nontender, without masses, guarding or rebound.  Liver/spleen:  No organomegaly noted  Hernia:  None appreciated  Skin  Inspection:  Grossly normal Breasts: Examined lying and sitting.   Right: Without masses, retractions, nipple discharge or axillary adenopathy.   Left: Without masses, retractions, nipple discharge or axillary adenopathy. Pelvic: External genitalia:  no lesions              Urethra:  normal appearing urethra with no masses, tenderness or lesions              Bartholins and Skenes: normal                 Vagina: normal appearing vagina with normal color and discharge, no lesions              Cervix: no lesions Bimanual Exam:  Uterus:  no masses or tenderness              Adnexa: no mass, fullness, tenderness              Rectovaginal: Deferred              Anus:  normal, no lesions  Patient informed chaperone available to be present for breast and pelvic exam. Patient has requested no chaperone to be present. Patient has been advised what will be completed during breast and pelvic exam.   Vaginal ultrasound: Anteverted uterus, normal size and shape, 11 mm posterior intramural fibroid noted.  Thin, symmetrical endometrium - 6.34 mm.  No mass or thickening seen, avascular.  Both ovaries within normal limits.  No adnexal masses, no free fluid.  Assessment/Plan:  48 y.o. G0 for breast and pelvic exam.   Encounter for breast and pelvic examination - Education provided on SBEs,  importance of preventative screenings, current guidelines, high calcium diet, regular exercise, and multivitamin daily. Labs with PCP.   HSV (herpes simplex virus) anogenital infection - Plan: valACYclovir (VALTREX) 500 MG tablet for 3-5 days at first sign of outbreak. Outbreaks occasionally, prefers to take as needed.  Dysmenorrhea - Requesting surgical consult. Will continue OCPs, Ibuprofen until then.   Menorrhagia with regular cycle - continue COCs  Screening for  cervical cancer - Normal Pap history. Will repeat at 5-year interval per guidelines.   Screening for breast cancer - Normal mammogram history.  Continue annual screenings.  Normal breast exam today.  Screening for colon cancer - 06/2022 colonoscopy. Will repeat at 7-year interval per GI recommendation.   Return in about 1 year (around 02/01/2025) for Annual. Will schedule surgical consult.      [name] DNP, 11:29 AM 02/02/2024'

## 2024-02-03 ENCOUNTER — Encounter: Payer: Self-pay | Admitting: Psychiatry

## 2024-02-03 ENCOUNTER — Ambulatory Visit (INDEPENDENT_AMBULATORY_CARE_PROVIDER_SITE_OTHER): Payer: Medicare HMO | Admitting: Psychiatry

## 2024-02-03 DIAGNOSIS — F3132 Bipolar disorder, current episode depressed, moderate: Secondary | ICD-10-CM

## 2024-02-03 DIAGNOSIS — F5105 Insomnia due to other mental disorder: Secondary | ICD-10-CM

## 2024-02-03 DIAGNOSIS — F411 Generalized anxiety disorder: Secondary | ICD-10-CM

## 2024-02-03 DIAGNOSIS — F4310 Post-traumatic stress disorder, unspecified: Secondary | ICD-10-CM | POA: Diagnosis not present

## 2024-02-03 DIAGNOSIS — F315 Bipolar disorder, current episode depressed, severe, with psychotic features: Secondary | ICD-10-CM

## 2024-02-03 DIAGNOSIS — G2581 Restless legs syndrome: Secondary | ICD-10-CM

## 2024-02-03 DIAGNOSIS — F515 Nightmare disorder: Secondary | ICD-10-CM

## 2024-02-03 DIAGNOSIS — F99 Mental disorder, not otherwise specified: Secondary | ICD-10-CM

## 2024-02-03 DIAGNOSIS — F4312 Post-traumatic stress disorder, chronic: Secondary | ICD-10-CM

## 2024-02-03 MED ORDER — LORAZEPAM 1 MG PO TABS: ORAL_TABLET | ORAL | 1 refills | Status: DC

## 2024-02-03 MED ORDER — GABAPENTIN 400 MG PO CAPS: ORAL_CAPSULE | ORAL | 0 refills | Status: DC

## 2024-02-03 MED ORDER — LAMOTRIGINE 150 MG PO TABS: 150.0000 mg | ORAL_TABLET | Freq: Every day | ORAL | 1 refills | Status: DC

## 2024-02-03 MED ORDER — SERTRALINE HCL 25 MG PO TABS: ORAL_TABLET | ORAL | 0 refills | Status: DC

## 2024-02-03 MED ORDER — ESZOPICLONE 3 MG PO TABS: 3.0000 mg | ORAL_TABLET | Freq: Every day | ORAL | 2 refills | Status: DC

## 2024-02-03 MED ORDER — LURASIDONE HCL 120 MG PO TABS: 120.0000 mg | ORAL_TABLET | Freq: Every day | ORAL | 1 refills | Status: DC

## 2024-02-03 NOTE — Progress Notes (Signed)
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 Connections: Socially Integrated (03/09/2023)   Social Connection and Isolation Panel [NHANES]    Frequency of Communication with Friends and Family: More than three times a week    Frequency of Social Gatherings with Friends and Family: Never    Attends Religious Services: More than 4 times per year    Active Member of [employer] or Organizations: Yes    Attends Banker Meetings: Never    Marital Status: Married  Catering manager Violence: Not At Risk (03/09/2023)   Humiliation, Afraid, Rape, and Kick questionnaire    Fear of Current or Ex-Partner: No    Emotionally Abused: No    Physically Abused: No    Sexually Abused: No    Past Medical History, Surgical history, Social history, and Family history were reviewed and updated as appropriate.   Please see review of systems for further details on the patient's review from today.   Objective:   Physical Exam:  LMP 01/11/2024 (Exact Date)   Physical Exam Constitutional:      General: She is not in acute  distress. Musculoskeletal:        General: No deformity.  Neurological:     Mental Status: She is alert and oriented to person, place, and time.     Coordination: Coordination normal.  Psychiatric:  Attention and Perception: Attention and perception normal. She does not perceive auditory or visual hallucinations.        Mood and Affect: Mood is anxious. Mood is not depressed or elated. Affect is not labile, angry, tearful or inappropriate.        Speech: Speech normal. Speech is not rapid and pressured.        Behavior: Behavior normal.        Thought Content: Thought content is not paranoid or delusional. Thought content does not include homicidal or suicidal ideation. Thought content does not include suicidal plan.        Cognition and Memory: Cognition and memory normal.        Judgment: Judgment normal.     Comments: Insight intact No AIM  manic tension and irritablity      Lab Review:     Component Value Date/Time   NA 137 04/21/2022 1049   K 4.4 04/21/2022 1049   CL 103 04/21/2022 1049   CO2 20 04/21/2022 1049   GLUCOSE 91 04/21/2022 1049   GLUCOSE 87 09/18/2021 1125   BUN 10 04/21/2022 1049   CREATININE 0.77 04/21/2022 1049   CREATININE 0.67 09/18/2021 1125   CALCIUM 9.6 04/21/2022 1049   PROT 6.5 04/21/2022 1049   ALBUMIN 4.6 04/21/2022 1049   AST 19 04/21/2022 1049   ALT 10 04/21/2022 1049   ALKPHOS 78 04/21/2022 1049   BILITOT 0.4 04/21/2022 1049       Component Value Date/Time   WBC 8.7 04/21/2022 1049   WBC 5.7 09/18/2021 1125   RBC 4.64 04/21/2022 1049   RBC 3.89 09/18/2021 1125   HGB 14.5 04/21/2022 1049   HCT 42.6 04/21/2022 1049   PLT 338 04/21/2022 1049   MCV 92 04/21/2022 1049   MCH 31.3 04/21/2022 1049   MCH 32.1 09/18/2021 1125   MCHC 34.0 04/21/2022 1049   MCHC 33.6 09/18/2021 1125   RDW 12.2 04/21/2022 1049   LYMPHSABS 1,995 09/18/2021 1125   MONOABS 570 03/20/2017 1100   EOSABS 251 09/18/2021 1125   BASOSABS 91 09/18/2021 1125     No results found for: "POCLITH", "LITHIUM"   No results found for: "PHENYTOIN", "PHENOBARB", "VALPROATE", "CBMZ"   .res Assessment: Plan:    There are no diagnoses linked to this encounter.   30 min face to face time with patient was spent on counseling and coordination of care. We discussed the following: Seth has treatment resistant bipolar disorder with psychotic features versus schizoaffective disorder and other psychiatric diagnoses as well.  She has been very difficult to treat in part because she is very sensitive to antipsychotics and yet has strong history of chronic auditory hallucinations.    She is chronically anxious and depressed.  Chronic mood cycline is typical. Extensive history of psychiatric meds tried discussed with the patient.    Discussed the very high dose of recurrent ropinirole and possible psychiatric side effects with that.   TR insomnia chronically.  But less awakening at present discussed Ambien amnesia and side effects. No change in sleep meds this visit RLS managed right now. RLS managed and neuro checked iron studies reportedly ok.  her auditory hallucinations have stopped.  But we can still consider if needed, Clozapine option.       Consider off label Rexulti bc less likely to cause RLS than alternatives but she needs generics for cost reasons.  The increase to 6 mg HS doxazosin was successful at managing nightmares.   No  Caffeine  after noon.  This is helped.   continue Gabapentin for off label anxiety and RLS at previous dosage.  400 BID and 800 pm.  This has been helpful though has not resolve the problem.   Discussed the risk of polypharmacy.  She is on multiple medications.  Disc purpose of each of the meds and consider changing some of them.  Undesirable polypharmacy but necessary bc chronic psych instability with history severe sx.she is benefitting and tolerating meds.  Counseled patient regarding potential benefits, risks, and side  effects of Lamictal to include potential risk of Stevens-Johnson syndrome. Advised patient to stop taking Lamictal and contact office immediately if rash develops and to seek urgent medical attention if rash is severe and/or spreading quickly.  Lamictal 150  Continue cyclobenzaprine 20 mg nightly for RLS Continue doxazosin 6 mg nightly For RLS gabapentin 400 mg twice daily and 1200 mg nightly Continue lorazepam 1 mg BID  and 2 tabs HS ropinirole 2 mg at 3 PM and 8 mg nightly. High dose necessary to control RLS Reduced sertraline 50 mg daily DT mania Continue Lunesta 3 for longer duration vs Ambien.  Discussed side effects including amnesia. increase Latuda per her request 120 mg daily.  Need counseling and started Lowella Dandy at [facility].  . Previously Discussed safety plan at length with patient.  Advised patient to contact office with any worsening signs and symptoms.  Instructed patient to go to the [facility] emergency room for evaluation if experiencing any acute safety concerns, to include suicidal intent.  Discussed potential metabolic side effects associated with atypical antipsychotics, as well as potential risk for movement side effects. Advised pt to contact office if movement side effects occur.   She agrees with the plan  Follow-up 1 mos  Meredith Staggers MD, DFAPA Please see After Visit Summary for patient specific instructions.  Future Appointments  Date Time Provider Department Center  02/08/2024  8:30 AM Earley Favor, MD [facility] None  04/11/2024 10:40 AM [facility] ANNUAL WELLNESS VISIT [facility]       No orders of the defined types were placed in this encounter.    -------------------------------

## 2024-02-08 ENCOUNTER — Ambulatory Visit (INDEPENDENT_AMBULATORY_CARE_PROVIDER_SITE_OTHER): Payer: Medicare HMO | Admitting: Obstetrics and Gynecology

## 2024-02-08 VITALS — BP 110/68 | HR 78 | Ht 66.0 in | Wt 180.0 lb

## 2024-02-08 DIAGNOSIS — N920 Excessive and frequent menstruation with regular cycle: Secondary | ICD-10-CM | POA: Diagnosis not present

## 2024-02-08 DIAGNOSIS — Z01818 Encounter for other preprocedural examination: Secondary | ICD-10-CM

## 2024-02-08 DIAGNOSIS — N946 Dysmenorrhea, unspecified: Secondary | ICD-10-CM

## 2024-02-08 DIAGNOSIS — N951 Menopausal and female climacteric states: Secondary | ICD-10-CM | POA: Diagnosis not present

## 2024-02-08 NOTE — Progress Notes (Addendum)
 Beth [name] September 09, 1976 [id number]  Patient presents for surgical consult Per Beth Beady, NP last note: History:  48 y.o. G0 presents for breast and pelvic exam and ultrasound consult. Seen 01/14/2024 for heavy, painful periods. Cramps are unbearable and affecting her daily life. Also has back pain. Using super tampons. Has tried heat, OTC pain meds with no relief. Cycles are monthly. Prescribed POPs November 2023 for management but had irregular bleeding, so she discontinued. Wants hysterectomy. Sister had hysterectomy for heavy periods. COCs provided in the interim, recommend Ibuprofen 800 mg every 8 hours for cramping. Normal pap and mammogram history. History of bipolar disorder, HSV - occasional outbreaks.  Today patient is on CD4 and is bent over in pain.  She reports they are heavy as well.  She is using a super tampon q 2 hours and has noticed clots. She tried IVF in the past, and could not conceive and does not want any children at this point and is frustrated with the bleeding and pain.  She is here today to discuss having a definitive management with the [facility]  She would like to preserve her ovaries. She has tried 2 different ocp's and is still having the pain and bleeding. The pain radiates from her lower pelvis all the way around to her lower back and affects her life. Motrin and tylenol has not helped as well.  Gynecologic History Patient's last menstrual period was 02/05/2024 (exact date).   Contraception/Family planning: OCP (estrogen/progesterone) Sexually active: Yes  Health Maintenance Last Pap: 09/18/2021. Results were: Normal neg HPV Last mammogram: 09/26/2023. Results were: Normal Last colonoscopy: 06/19/2022. Results were: Tubular adenoma, 7-year recall Last Dexa: Not indicated  Past medical history, past surgical history, family history and social history were all reviewed and documented in the EPIC chart. Married. On disability.   ROS:  A ROS was performed and  pertinent positives and negatives are included.  Exam:  Vitals:   02/08/24 0818  BP: 110/68  Pulse: 78  SpO2: 100%  Weight: 180 lb (81.6 kg)  Height: 5\' 6"  (1.676 m)   Date: 02/02/2024 Department: [facility] Released By: Thomes Cake D Authorizing: Beth Mackie, NP   Exam Status  Status  Final [99]   PACS Intelerad Image Link   Show images for US PELVIS TRANSVAGINAL NON-OB (TV ONLY) Study Result  Narrative & Impression  Vaginal ultrasound:   Anteverted uterus, normal size and shape, 11 mm posterior intramural fibroid noted.  Thin, symmetrical endometrium - 6.34 mm.  No mass or thickening seen, avascular.     Both ovaries within normal limits.     No adnexal masses, no free fluid.      Body mass index is 29.05 kg/m.  Past Medical History:  Diagnosis Date   Anxiety    on meds   Bipolar disorder (HCC)    Depression    on meds   HSV (herpes simplex virus) anogenital infection 05/2018   RLS (restless legs syndrome)    Seasonal allergies    Past Surgical History:  Procedure Laterality Date   BARTHOLIN CYST MARSUPIALIZATION     COLONOSCOPY  06/19/2022   IVF     WISDOM TOOTH EXTRACTION     Social History   Socioeconomic History   Marital status: Married    Spouse name: Beth [name]   Number of children: 0   Years of education: 16   Highest education level: Associate degree: academic program  Occupational History   Occupation: unemployed  Occupation: disabled  Tobacco Use   Smoking status: Former    Current packs/day: 0.00    Average packs/day: 1 pack/day for 27.0 years (27.0 ttl pk-yrs)    Types: Cigarettes    Start date: 12/15/1992    Quit date: 12/16/2019    Years since quitting: 4.1   Smokeless tobacco: Never   Tobacco comments:    Started at 48 years old. 1 pack/day. Quit 2 years ago.   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    Social Drivers of Corporate investment banker Strain: Low Risk  (03/09/2023)   Overall Financial Resource Strain (CARDIA)    Difficulty of Paying Living Expenses: Not hard at all  Food Insecurity: No Food Insecurity (03/09/2023)   Hunger Vital Sign    Worried About Running Out of Food in the Last Year: Never true    Ran Out of Food in the Last Year: Never true  Transportation Needs: No Transportation Needs (03/09/2023)   PRAPARE - Administrator, Civil Service (Medical): No    Lack of Transportation (Non-Medical): No  Physical Activity: Inactive (03/09/2023)   Exercise Vital Sign    Days of Exercise per Week: 0 days    Minutes of Exercise per Session: 0 min  Stress: Stress Concern Present (03/09/2023)   [employer] of Occupational Health - Occupational Stress Questionnaire    Feeling of Stress : Rather much  Social Connections: Socially Integrated (03/09/2023)   Social Connection and Isolation Panel [NHANES]    Frequency of Communication with Friends and Family: More than three times a week    Frequency of Social Gatherings with Friends and Family: Never    Attends Religious Services: More than 4 times per year    Active Member of [employer] or Organizations: Yes    Attends Banker Meetings: Never    Marital Status: Married   No Known Allergies Current Outpatient Medications on File Prior to Visit  Medication Sig Dispense Refill   albuterol (VENTOLIN HFA) 108 (90 Base) MCG/ACT inhaler Inhale 2 puffs into the lungs every 6 (six) hours as needed for wheezing or shortness of breath. 8.5 g 3   Budeson-Glycopyrrol-Formoterol (BREZTRI  AEROSPHERE) 160-9-4.8 MCG/ACT AERO Inhale 2 puffs into the lungs 2 (two) times daily. Take 2 puffs first thing in am and then another 2 puffs about 12 hours later. 10.7 g 11   cyclobenzaprine (FLEXERIL) 10 MG tablet Take 2 tablets (20 mg total) by mouth at bedtime. 180 tablet 10   doxazosin (CARDURA) 4 MG tablet TAKE 1 AND 1/2 TABLETS AT BED TIME ( 6 MG TOTAL) 135 tablet 10   Eszopiclone 3 MG TABS Take 1 tablet (3 mg total) by mouth at bedtime. Take immediately before bedtime 30 tablet 2   fluticasone (FLONASE) 50 MCG/ACT nasal spray Place 2 sprays into both nostrils daily. 16 g 6   gabapentin (NEURONTIN) 400 MG capsule TAKE 1 CAPSULE TWICE DAILY  AND TAKE 3 CAPSULES IN THE EVENING 450  capsule 0   lamoTRIgine (LAMICTAL) 150 MG tablet Take 1 tablet (150 mg total) by mouth daily. 90 tablet 1   LORazepam (ATIVAN) 1 MG tablet TAKE 1 TABLET BY MOUTH 2 TIMES A DAY AND 2 TABLETS AT NIGHT 120 tablet 1   Lurasidone HCl (LATUDA) 120 MG TABS Take 1 tablet (120 mg total) by mouth daily. 30 tablet 1   Multiple Vitamin (MULTIVITAMIN) tablet Take 1 tablet by mouth daily.     norethindrone-ethinyl estradiol-FE (LOESTRIN FE) 1-20 MG-MCG tablet Take 1 tablet by mouth daily. 84 tablet 0   rOPINIRole (REQUIP) 2 MG tablet TAKE 1 TABLET AT 3PM 90 tablet 3   rOPINIRole (REQUIP) 4 MG tablet TAKE 2 TABLETS AT BEDTIME 180 tablet 3   sertraline (ZOLOFT) 25 MG tablet Take 2 tablets everyday 180 tablet 0   valACYclovir (VALTREX) 500 MG tablet Take one tablet by mouth twice daily for 3-5 days then daily as needed. 90 tablet 1   No current facility-administered medications on file prior to visit.       Vaginal ultrasound: Anteverted uterus, normal size and shape, 11 mm posterior intramural fibroid noted.  Thin, symmetrical endometrium - 6.34 mm.  No mass or thickening seen, avascular.  Both ovaries within normal limits.  No adnexal masses, no free fluid.  Assessment/Plan:  48 y.o. G0 with likely endometriosis,  menorrhagia, dysmenorrhea, failed hormonal management desires definitive management with RLH Symptomatic fibroid uterus:  Counseled on all options.  She would like to have the [facility].  Counseled extensively on the procedure including but not limited to what to expect and risks and benefits.  Counseled on postop care and pelvic rest for 10 weeks after the surgery with restricted lifting for 6 weeks after.  Counseled on the benefits of the robotic procedure with faster return to daily activities, improved outcomes, and less risk for complications. She would like to have this scheduled. She will need an EMB and pap smear before surgery.  She will return for this.  Patient to take motrin prior to biopsy.  30 minutes spent on reviewing records, imaging,  and one on one patient time and counseling patient and documentation Dr. Karma Greaser

## 2024-02-09 ENCOUNTER — Encounter: Payer: Self-pay | Admitting: Obstetrics and Gynecology

## 2024-02-09 LAB — CBC
HCT: 42.6 % (ref 35.0–45.0)
Hemoglobin: 13.9 g/dL (ref 11.7–15.5)
MCH: 31 pg (ref 27.0–33.0)
MCHC: 32.6 g/dL (ref 32.0–36.0)
MCV: 94.9 fL (ref 80.0–100.0)
MPV: 10.9 fL (ref 7.5–12.5)
Platelets: 340 10*3/uL (ref 140–400)
RBC: 4.49 10*6/uL (ref 3.80–5.10)
RDW: 11.6 % (ref 11.0–15.0)
WBC: 4.8 10*3/uL (ref 3.8–10.8)

## 2024-02-09 LAB — VITAMIN D 25 HYDROXY (VIT D DEFICIENCY, FRACTURES): Vit D, 25-Hydroxy: 20 ng/mL — ABNORMAL LOW (ref 30–100)

## 2024-02-09 LAB — ESTRADIOL: Estradiol: 56 pg/mL

## 2024-02-09 LAB — FOLLICLE STIMULATING HORMONE: FSH: 11.7 m[IU]/mL

## 2024-02-09 LAB — TSH: TSH: 1.06 m[IU]/L

## 2024-02-10 NOTE — Telephone Encounter (Signed)
 Spoke with patient. No changes in symptoms since OV 02/08/24. Denies any new symptoms. Patient is requesting earlier appt for EMB.   R/s to 02/22/24 at 1400, advised patient PAP also needed, Instructed patient to take Motrin 800 mg with food and water one hour before procedure.  Instructed patient to return call to office if symptoms get worse or new symptoms develop. Advised I will return call after EMB competed to review surgery dates.   Patient appreciative of call.   Routing to provider for final review. Patient is agreeable to disposition. Will close encounter.

## 2024-02-11 NOTE — Telephone Encounter (Signed)
 Spoke with patient. EMB and PAP rescheduled to 3/3 at 0830 with Dr. Karma Greaser.   Advised patient to allow approximately 2 weeks for benefits to be reviewed from date received. Benefits are reviewed in order and priority they are received. Questions answered.   Advised I will return call if any additional recommendations. Patient agreeable.   Dr. Karma Greaser -no appts available for 2/28, patient has been r/s to 3/3. If patient needs to move surgery priority to "urgent", please update open referral.

## 2024-02-15 ENCOUNTER — Other Ambulatory Visit (HOSPITAL_COMMUNITY)
Admission: RE | Admit: 2024-02-15 | Discharge: 2024-02-15 | Disposition: A | Discharge status: Home / Self Care | Source: Ambulatory Visit | Attending: Obstetrics and Gynecology | Admitting: Obstetrics and Gynecology

## 2024-02-15 ENCOUNTER — Ambulatory Visit: Payer: Medicare HMO | Admitting: Obstetrics and Gynecology

## 2024-02-15 VITALS — BP 120/82 | HR 88 | Wt 188.0 lb

## 2024-02-15 DIAGNOSIS — N951 Menopausal and female climacteric states: Secondary | ICD-10-CM | POA: Insufficient documentation

## 2024-02-15 DIAGNOSIS — N946 Dysmenorrhea, unspecified: Secondary | ICD-10-CM | POA: Insufficient documentation

## 2024-02-15 DIAGNOSIS — N920 Excessive and frequent menstruation with regular cycle: Secondary | ICD-10-CM | POA: Insufficient documentation

## 2024-02-15 DIAGNOSIS — Z01818 Encounter for other preprocedural examination: Secondary | ICD-10-CM

## 2024-02-15 NOTE — Progress Notes (Signed)
   Acute Office Visit  Subjective:    Patient ID: Beth [name], female    DOB: 1976/09/22, 48 y.o.   MRN: [id number]   HPI 48 y.o. presents today for Procedure (Endo bx patient states no chance of pregnancy. ) .preop for [facility]  Patient's last menstrual period was 02/05/2024 (exact date).    Review of Systems     Objective:    OBGyn Exam  BP 120/82   Pulse 88   Wt 188 lb (85.3 kg)   LMP 02/05/2024 (Exact Date)   SpO2 100%   BMI 30.34 kg/m  Wt Readings from Last 3 Encounters:  02/15/24 188 lb (85.3 kg)  02/08/24 180 lb (81.6 kg)  02/02/24 177 lb (80.3 kg)    PROCEDURE: EMB Consent obtained for the procedure. Time out performed. A bivalve speculum was placed in the vagina.  The cervix was grasped with a single tooth tenaculum.  Pipelle was inserted and rotated. Uterus sound to 8cm.  Adequate specimen was obtained and sent to pathology.  All instruments were removed.  Patient tolerated the procedure well.  To notify patient of the results.      Patient informed chaperone available to be present for breast and/or pelvic exam. Patient has requested no chaperone to be present. Patient has been advised what will be completed during breast and pelvic exam.   Assessment & Plan:  DUB, menorrhagia, dysmenorrhea, pelvic pain for preop EMB EMB collected today.  To notify patient of the results.  Patient is ready to schedule her RLH.  Message sent to Carmelina Dane for scheduling.  Dr. Arman Filter Tresa Res

## 2024-02-16 LAB — SURGICAL PATHOLOGY

## 2024-02-17 ENCOUNTER — Encounter: Payer: Self-pay | Admitting: Obstetrics and Gynecology

## 2024-02-22 ENCOUNTER — Ambulatory Visit: Payer: Medicare HMO | Admitting: Obstetrics and Gynecology

## 2024-02-24 ENCOUNTER — Ambulatory Visit: Admitting: Internal Medicine

## 2024-02-29 ENCOUNTER — Other Ambulatory Visit: Payer: Self-pay | Admitting: Psychiatry

## 2024-02-29 DIAGNOSIS — F315 Bipolar disorder, current episode depressed, severe, with psychotic features: Secondary | ICD-10-CM

## 2024-02-29 DIAGNOSIS — F4310 Post-traumatic stress disorder, unspecified: Secondary | ICD-10-CM

## 2024-02-29 DIAGNOSIS — F411 Generalized anxiety disorder: Secondary | ICD-10-CM

## 2024-03-09 ENCOUNTER — Ambulatory Visit: Payer: Medicare HMO | Admitting: Obstetrics and Gynecology

## 2024-03-13 NOTE — Progress Notes (Deleted)
 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 Asthma?       Had excellent ex tol and no need for any inhalers prior 03/2022 cxr at [facility] neg but rx as CAP and nebulizer turned things and never completely recovered maybe 75% never tried then acutely ill 01/30/23 severe cough > yellow mucus chest tightness sob  01/31/23 minute clinic pos type A flu rx tamiflu > overall 50% but no neb but continue to use inhaler.   History of Present Illness  02/06/2023  Pulmonary/ 1st Beth [name] eval/Beth Beth [name]  Chief Complaint  Patient presents with   Consult    Pneumonia 04/2022.  Influenza 02/01/23. Chest tightness, cough and wheeze.  Sx resolved by next day.  Dyspnea:  with housecleaning uses saba  seems to help -not working out yet  Cough: better now but not gone > am only is still slt  yellow  Sleep: disrupted since 01/30/23 at least once a night / mucinex  SABA use: much less now  Rec Zpak  Plan A = Automatic = Always=    Symbicort 80 Take 2 puffs first thing in am and then another 2 puffs about 12 hours later.  Work on inhaler technique: Plan B = Backup (to supplement plan A, not to replace it) Only use your albuterol inhaler as a rescue medication  Plan C = Crisis (instead of Plan B but only if Plan B stops working) - only use your albuterol nebulizer if you first try Plan B  Also  Ok to try albuterol 15 min before an activity (on alternating days)  that you know would usually make you short of breath Try prilosec otc 20mg   Take 30-60 min before first meal of the day and Pepcid ac (famotidine) 20 mg one @  bedtime until cough is completely gone for at least a week without the need for cough suppression GERD diet reviewed, bed blocks rec  Please schedule a follow up Beth [name] visit in 2 weeks, sooner if needed  with all  medications /inhalers/ solutions in hand     02/17/2023  f/u ov/Beth Beth [name] re: asthma/ doe    maint on  symbicort 80  2 bid / no meds/ using mints Chief Complaint  Patient presents with   Follow-up    Breathing has improved with symbicort. She was dx with flu 2 wks ago and has had prod cough with green sputum. She uses her albuterol inhaler about once per wk.   Dyspnea:  housecleaning  Cough: still coughing up green mucus sporadically during the day  Sleeping: on side flat bed one pillow no resp resp SABA use: none 02: none  Covid status:   vax x 3 not most recent Rec Augmentin 875 mg take one pill twice daily  X 10 days  Prednisone 10 mg take  4 each am x 2 days,   2 each am x 2 days,  1 each am x 2 days and stop Work on inhaler technique:    Also  Ok to try albuterol 15 min before an activity (on alternating days)  that you know would usually make you short of breath    10/27/2023  f/u ov/Beth Beth [name]/Beth Beth [name] re: asthma changed  maint to trelegy 100  during acute flare on symbicort 160  with criteria for GOLD 2 copd during flare Chief Complaint  Patient presents with   Cough variant asthma  Dyspnea:  decline prior to flare with housecleaning /  Cough: feels due to PNDS  Sleeping: flat s resp cc  SABA use: none  02: none  Rec    03/17/2024  f/u ov/Beth Beth [name]/Beth Beth [name] re: *** maint on ***  No chief complaint on file.   Dyspnea:  *** Cough: *** Sleeping: ***   resp cc  SABA use: *** 02: ***  Lung cancer screening: ***   No obvious day to day or daytime variability or assoc excess/ purulent sputum or mucus plugs or hemoptysis or cp or chest tightness, subjective wheeze or overt sinus or hb symptoms.    Also denies any obvious fluctuation of symptoms with weather or environmental changes or other aggravating or alleviating factors except as outlined above   No unusual exposure hx or h/o childhood pna/ asthma or knowledge of premature birth.  Current Allergies, Complete  Past Medical History, Past Surgical History, Family History, and Social History were reviewed in [employer] record.  ROS  The following are not active complaints unless bolded Hoarseness, sore throat, dysphagia, dental problems, itching, sneezing,  nasal congestion or discharge of excess mucus or purulent secretions, ear ache,   fever, chills, sweats, unintended wt loss or wt gain, classically pleuritic or exertional cp,  orthopnea pnd or arm/hand swelling  or leg swelling, presyncope, palpitations, abdominal pain, anorexia, nausea, vomiting, diarrhea  or change in bowel habits or change in bladder habits, change in stools or change in urine, dysuria, hematuria,  rash, arthralgias, visual complaints, headache, numbness, weakness or ataxia or problems with walking or coordination,  change in mood or  memory.        No outpatient medications have been marked as taking for the 03/17/24 encounter (Appointment) with Beth Cowden, MD.               Objective:    Wts  03/17/2024        ***  10/27/2023    183   02/17/23 178 lb (80.7 kg)  02/06/23 173 lb 3.2 oz (78.6 kg)  01/20/23 170 lb (77.1 kg)    Vital signs reviewed  03/17/2024  - Note at rest 02 sats  ***% on ***   General appearance:    ***           Assessment

## 2024-03-15 ENCOUNTER — Encounter: Admitting: Obstetrics and Gynecology

## 2024-03-17 ENCOUNTER — Ambulatory Visit: Admitting: Internal Medicine

## 2024-03-26 ENCOUNTER — Other Ambulatory Visit: Payer: Self-pay | Admitting: Psychiatry

## 2024-03-26 DIAGNOSIS — F3132 Bipolar disorder, current episode depressed, moderate: Secondary | ICD-10-CM

## 2024-04-03 ENCOUNTER — Other Ambulatory Visit: Payer: Self-pay | Admitting: Nurse Practitioner

## 2024-04-03 DIAGNOSIS — N920 Excessive and frequent menstruation with regular cycle: Secondary | ICD-10-CM

## 2024-04-03 DIAGNOSIS — N946 Dysmenorrhea, unspecified: Secondary | ICD-10-CM

## 2024-04-04 NOTE — Telephone Encounter (Signed)
 Med refill request:Beth [name]  Last AEX: 02/02/24 last OV 02/15/24 Next AEX: next OV 04/21/24 Last MMG (if hormonal med) 09/26/23 birads cat 1 neg Refill authorized: last rx 01/14/24 #84 with 0 refills. Please approve or deny

## 2024-04-12 ENCOUNTER — Encounter: Payer: Self-pay | Admitting: Psychiatry

## 2024-04-12 ENCOUNTER — Ambulatory Visit (INDEPENDENT_AMBULATORY_CARE_PROVIDER_SITE_OTHER): Payer: Medicare HMO | Admitting: Psychiatry

## 2024-04-12 ENCOUNTER — Encounter: Payer: Self-pay | Admitting: Internal Medicine

## 2024-04-12 DIAGNOSIS — F5105 Insomnia due to other mental disorder: Secondary | ICD-10-CM | POA: Diagnosis not present

## 2024-04-12 DIAGNOSIS — F4312 Post-traumatic stress disorder, chronic: Secondary | ICD-10-CM

## 2024-04-12 DIAGNOSIS — F99 Mental disorder, not otherwise specified: Secondary | ICD-10-CM

## 2024-04-12 DIAGNOSIS — F3132 Bipolar disorder, current episode depressed, moderate: Secondary | ICD-10-CM

## 2024-04-12 DIAGNOSIS — F4310 Post-traumatic stress disorder, unspecified: Secondary | ICD-10-CM

## 2024-04-12 DIAGNOSIS — F411 Generalized anxiety disorder: Secondary | ICD-10-CM

## 2024-04-12 DIAGNOSIS — F515 Nightmare disorder: Secondary | ICD-10-CM

## 2024-04-12 DIAGNOSIS — G2581 Restless legs syndrome: Secondary | ICD-10-CM

## 2024-04-12 MED ORDER — LURASIDONE HCL 80 MG PO TABS: 80.0000 mg | ORAL_TABLET | Freq: Two times a day (BID) | ORAL | 0 refills | Status: DC

## 2024-04-12 NOTE — Progress Notes (Deleted)
 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 Asthma?       Had excellent ex tol and no need for any inhalers prior 03/2022 cxr at [facility] cone neg but rx as CAP and nebulizer turned things and never completely recovered maybe 75% never tried then acutely ill 01/30/23 severe cough > yellow mucus chest tightness sob  01/31/23 minute clinic pos type A flu rx tamiflu > overall 50% but no neb but continue to use inhaler.   History of Present Illness  02/06/2023  Pulmonary/ 1st [name] eval/Beth [name]  Chief Complaint  Patient presents with   Consult    Pneumonia 04/2022.  Influenza 02/01/23. Chest tightness, cough and wheeze.  Sx resolved by next day.  Dyspnea:  with housecleaning uses saba  seems to help -not working out yet  Cough: better now but not gone > am only is still slt  yellow  Sleep: disrupted since 01/30/23 at least once a night / mucinex  SABA use: much less now  Rec Zpak  Plan A = Automatic = Always=    Symbicort  80 Take 2 puffs first thing in am and then another 2 puffs about 12 hours later.  Work on inhaler technique: Plan [name] = Backup (to supplement plan A, not to replace it) Only use your albuterol  inhaler as a rescue medication  Plan C = Crisis (instead of Plan [name] but only if Plan [name] stops working) - only use your albuterol  nebulizer if you first try Plan [name]  Also  Ok to try albuterol  15 min before an activity (on alternating days)  that you know would usually make you short of breath Try prilosec otc 20mg   Take 30-60 min before first meal of the day and Pepcid ac (famotidine) 20 mg one @  bedtime until cough is completely gone for at least a week without the need for cough suppression GERD diet reviewed, bed blocks rec  Please schedule a follow up [name] visit in 2 weeks, sooner if needed  with all  medications /inhalers/ solutions in hand     10/27/2023  f/u ov/[place] [name]/Beth [name] re: asthma changed  maint to trelegy 100  during acute flare on symbicort  160 with criteria for GOLD 2 copd during flare Chief Complaint  Patient presents with   Cough variant asthma  Dyspnea:  decline prior to flare with housecleaning /  Cough: feels due to PNDS  Sleeping: flat s resp cc  SABA use: none  02: none  Rec Plan A = Automatic = Always=    change to Breztri  Take 2 puffs first thing in am and then another 2 puffs about 12 hours later.  Work on inhaler technique:   Plan [name] = Backup (to supplement plan A, not to replace it) Only use your albuterol  inhaler as a rescue medication Plan C = Crisis (instead of Plan [name] but only if Plan [name] stops working) - only use your albuterol  nebulizer if you first try Plan [name]   If not satisfied with control of your symptoms, please call for allergy referral  Please schedule a follow up visit in 6 months but call sooner if needed  with all respiratory medications /inhalers/ solutions in hand   04/13/2024  f/u ov/Beth [name]/Beth [name] re: GOLD 3 copd/AB maint on ***  did *** bring meds  No chief complaint on file.   Dyspnea:  *** Cough: *** Sleeping: ***   resp cc  SABA use: *** 02: ***  Lung cancer screening: ***   No obvious day to day or daytime variability or assoc excess/ purulent sputum or mucus plugs or hemoptysis or cp or chest tightness, subjective wheeze or overt sinus or hb symptoms.    Also denies any obvious fluctuation of symptoms with weather or environmental changes or other aggravating or alleviating factors except as outlined above   No unusual exposure hx or h/o childhood pna/ asthma or knowledge of premature birth.  Current Allergies, Complete Past Medical History, Past Surgical History, Family History, and Social History were reviewed in [employer] record.  ROS  The following are not active complaints unless  bolded Hoarseness, sore throat, dysphagia, dental problems, itching, sneezing,  nasal congestion or discharge of excess mucus or purulent secretions, ear ache,   fever, chills, sweats, unintended wt loss or wt gain, classically pleuritic or exertional cp,  orthopnea pnd or arm/hand swelling  or leg swelling, presyncope, palpitations, abdominal pain, anorexia, nausea, vomiting, diarrhea  or change in bowel habits or change in bladder habits, change in stools or change in urine, dysuria, hematuria,  rash, arthralgias, visual complaints, headache, numbness, weakness or ataxia or problems with walking or coordination,  change in mood or  memory.        No outpatient medications have been marked as taking for the 04/13/24 encounter (Appointment) with Beth Strozier B, MD.               Objective:    Wts  04/13/2024       ***  10/27/2023    183   02/17/23 178 lb (80.7 kg)  02/06/23 173 lb 3.2 oz (78.6 kg)  01/20/23 170 lb (77.1 kg)    Vital signs reviewed  04/13/2024  - Note at rest 02 sats  ***% on ***   General appearance:    ***           Assessment

## 2024-04-12 NOTE — Patient Instructions (Addendum)
 Reduce sertraline  to 25 mg daily for 1 week and then stop it. Wait 2 more weeks to evaluate whether cycling is better.  If cycling is not better , then increase Latuda  to 160 mg daily.

## 2024-04-12 NOTE — Progress Notes (Signed)
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    Worried About Running Out of Food in the Last Year: Never true    Ran Out of Food in the Last Year: Never true  Transportation Needs: No Transportation Needs (03/09/2023)   PRAPARE - Administrator, Civil Service (Medical): No    Lack of Transportation (Non-Medical): No  Physical Activity: Inactive (03/09/2023)   Exercise Vital Sign    Days of Exercise per Week: 0 days    Minutes of Exercise per Session: 0 min  Stress: Stress Concern Present (03/09/2023)   [employer] of Occupational Health - Occupational Stress Questionnaire    Feeling of Stress : Rather much  Social Connections: Socially Integrated (03/09/2023)   Social Connection and Isolation Panel [NHANES]    Frequency of Communication with Friends and Family: More than three times a week    Frequency of Social Gatherings with Friends and Family: Never    Attends Religious Services: More than 4 times per year    Active Member of [employer] or Organizations: Yes    Attends Banker Meetings: Never    Marital Status: Married  Catering manager Violence: Not At Risk (03/09/2023)   Humiliation, Afraid, Rape, and Kick questionnaire    Fear of Current or Ex-Partner: No    Emotionally Abused: No    Physically Abused: No    Sexually Abused: No     Past Medical History, Surgical history, Social history, and Family history were reviewed and updated as appropriate.   Please see review of systems for further details  on the patient's review from today.   Objective:   Physical Exam:  There were no vitals taken for this visit.  Physical Exam Constitutional:      General: She is not in acute distress. Musculoskeletal:        General: No deformity.  Neurological:     Mental Status: She is alert and oriented to person, place, and time.     Coordination: Coordination normal.  Psychiatric:        Attention and Perception: Attention and perception normal. She does not perceive auditory or visual hallucinations.        Mood and Affect: Mood is anxious and depressed. Mood is not elated. Affect is not labile, angry, tearful or inappropriate.        Speech: Speech normal. Speech is not rapid and pressured.        Behavior: Behavior normal.        Thought Content: Thought content is not paranoid or delusional. Thought content does not include homicidal or suicidal ideation. Thought content does not include suicidal plan.        Cognition and Memory: Cognition and memory normal.        Judgment: Judgment normal.     Comments: Insight intact No AIM  manic tension and irritablity cycling with dep[      Lab Review:     Component Value Date/Time   NA 137 04/21/2022 1049   K 4.4 04/21/2022 1049   CL 103 04/21/2022 1049   CO2 20 04/21/2022 1049   GLUCOSE 91 04/21/2022 1049   GLUCOSE 87 09/18/2021 1125   BUN 10 04/21/2022 1049   CREATININE 0.77 04/21/2022 1049   CREATININE 0.67 09/18/2021 1125   CALCIUM 9.6 04/21/2022 1049   PROT 6.5 04/21/2022 1049   ALBUMIN 4.6 04/21/2022 1049   AST 19 04/21/2022 1049   ALT 10 04/21/2022 1049   ALKPHOS 78 04/21/2022 1049   BILITOT 0.4 04/21/2022 1049       Component Value Date/Time   WBC 4.8 02/08/2024 0849   RBC 4.49 02/08/2024 0849   HGB 13.9 02/08/2024 0849   HGB 14.5 04/21/2022 1049    HCT 42.6 02/08/2024 0849   HCT 42.6 04/21/2022 1049   PLT 340 02/08/2024 0849   PLT 338 04/21/2022 1049   MCV 94.9 02/08/2024 0849   MCV 92 04/21/2022 1049   MCH 31.0 02/08/2024 0849   MCHC 32.6 02/08/2024 0849   RDW 11.6 02/08/2024 0849   RDW 12.2 04/21/2022 1049   LYMPHSABS 1,995 09/18/2021 1125   MONOABS 570 03/20/2017 1100   EOSABS 251 09/18/2021 1125   BASOSABS 91 09/18/2021 1125    No results found for: "POCLITH", "LITHIUM"   No results found for: "PHENYTOIN", "PHENOBARB", "VALPROATE", "CBMZ"   .res Assessment: Plan:    Beth [name] was seen today for follow-up, depression and anxiety.  Diagnoses and all orders for this visit:  Bipolar disorder with moderate depression (HCC) -     lurasidone  (LATUDA ) 80 MG TABS tablet; Take 1 tablet (80 mg total) by mouth 2 (two) times daily.  PTSD (post-traumatic stress disorder)  Generalized anxiety disorder  Insomnia due to other mental disorder  RLS (restless legs syndrome)  Nightmares associated with chronic post-traumatic stress disorder   40 min face to face time with patient was spent on counseling and coordination of care. We discussed the following: Damaiya has treatment resistant bipolar disorder with psychotic features versus schizoaffective disorder and other psychiatric diagnoses as well.  She has been very  difficult to treat in part because she is very sensitive to antipsychotics and yet has strong history of chronic auditory hallucinations.    She is chronically anxious and depressed.  Chronic mood cyclin is typical but worse.   Extensive history of psychiatric meds tried discussed with the patient.    Discussed the very high dose of recurrent ropinirole  and possible psychiatric side effects with that.  However required to manage RLS   TR insomnia chronically.  But less awakening at present discussed Ambien  amnesia and side effects. No change in sleep meds this visit RLS managed right now. RLS managed and neuro checked  iron studies reportedly ok.  her auditory hallucinations have stopped.  But we can still consider if needed, Clozapine option.       Consider off label Rexulti bc less likely to cause RLS than alternatives but she needs generics for cost reasons.  The increase to 6 mg HS doxazosin  was successful at managing nightmares.   No  Caffeine after noon.  This is helped.   continue Gabapentin  for off label anxiety and RLS at previous dosage.  400 BID and 800 pm.  This has been helpful though has not resolve the problem.   Discussed the risk of polypharmacy.  She is on multiple medications.  Disc purpose of each of the meds and consider changing some of them.  Undesirable polypharmacy but necessary bc chronic psych instability with history severe sx.she is benefitting and tolerating meds.  Counseled patient regarding potential benefits, risks, and side effects of Lamictal  to include potential risk of Stevens-Johnson syndrome. Advised patient to stop taking Lamictal  and contact office immediately if rash develops and to seek urgent medical attention if rash is severe and/or spreading quickly.  Lamictal  150  Continue cyclobenzaprine  20 mg nightly for RLS Continue doxazosin  6 mg nightly For RLS gabapentin  400 mg twice daily and 1200 mg nightly Continue lorazepam  1 mg BID  and 2 tabs HS ropinirole  2 mg at 3 PM and 8 mg nightly. High dose necessary to control RLS  Wean sertraline  50 mg daily DT rapid cylcing Reduce sertraline  to 25 mg daily for 1 week and then stop it. Wait 2 more weeks to evaluate whether cycling is better.  If cycling is not better , then increase Latuda  to 160 mg daily.  If Latuda  10 fails then retry Caplyta and wean Latuda   Continue Lunesta  3 for longer duration vs Ambien .  Discussed side effects including amnesia.  Need counseling and started Beth [name] at [facility].  . Previously Discussed safety plan at length with patient.  Advised patient to contact office  with any worsening signs and symptoms.  Instructed patient to go to the [facility] emergency room for evaluation if experiencing any acute safety concerns, to include suicidal intent.  Discussed potential metabolic side effects associated with atypical antipsychotics, as well as potential risk for movement side effects. Advised pt to contact office if movement side effects occur.   She agrees with the plan  Follow-up 2 mos  Nori Beat MD, DFAPA Please see After Visit Summary for patient specific instructions.  Future Appointments  Date Time Provider Department Center  04/13/2024 11:15 AM Diamond Formica, MD [facility] None  04/21/2024 10:00 AM Reinaldo Caras, MD [facility] None  05/31/2024  8:45 AM Diamond Formica, MD [facility] None  06/14/2024  9:30 AM Cottle, Kennedy Peabody., MD [facility] None       No orders of the defined types were placed in this  encounter.    -------------------------------

## 2024-04-13 ENCOUNTER — Encounter: Payer: Self-pay | Admitting: Internal Medicine

## 2024-04-13 ENCOUNTER — Ambulatory Visit: Admitting: Internal Medicine

## 2024-04-21 ENCOUNTER — Ambulatory Visit (INDEPENDENT_AMBULATORY_CARE_PROVIDER_SITE_OTHER): Admitting: Obstetrics and Gynecology

## 2024-04-21 ENCOUNTER — Encounter: Payer: Self-pay | Admitting: Obstetrics and Gynecology

## 2024-04-21 VITALS — BP 106/80 | HR 77 | Ht 66.0 in | Wt 181.0 lb

## 2024-04-21 DIAGNOSIS — N946 Dysmenorrhea, unspecified: Secondary | ICD-10-CM | POA: Diagnosis not present

## 2024-04-21 DIAGNOSIS — Z01818 Encounter for other preprocedural examination: Secondary | ICD-10-CM

## 2024-04-21 DIAGNOSIS — N920 Excessive and frequent menstruation with regular cycle: Secondary | ICD-10-CM | POA: Diagnosis not present

## 2024-04-21 MED ORDER — IBUPROFEN 800 MG PO TABS: 800.0000 mg | ORAL_TABLET | Freq: Three times a day (TID) | ORAL | 1 refills | Status: AC | PRN

## 2024-04-21 MED ORDER — INTRAROSA 6.5 MG VA INST: 1.0000 | VAGINAL_INSERT | Freq: Every evening | VAGINAL | 12 refills | Status: DC | PRN

## 2024-04-21 MED ORDER — OXYCODONE HCL 5 MG PO TABS: 5.0000 mg | ORAL_TABLET | ORAL | 0 refills | Status: DC | PRN

## 2024-04-21 MED ORDER — METOCLOPRAMIDE HCL 10 MG PO TABS: 10.0000 mg | ORAL_TABLET | Freq: Three times a day (TID) | ORAL | 0 refills | Status: DC | PRN

## 2024-04-21 MED ORDER — INTRAROSA 6.5 MG VA INST: 1.0000 | VAGINAL_INSERT | Freq: Every evening | VAGINAL | 12 refills | Status: AC | PRN

## 2024-04-21 NOTE — Progress Notes (Addendum)
 Beth [name] 1976-06-24 [id number]  PREOP H&P RLH robotic assisted total hysterectomy with salpingectomy, RLH,BS, Cysto with possible excision of endometriosis and peritoneal stripping CYSTOSCOPY,EXCISION, CYST, Ovary, robot-assisted, laparoscopic    Per Antonio Klinefelter, NP last note: History:  48 y.o. G0 presents for breast and pelvic exam and ultrasound consult. Seen 01/14/2024 for heavy, painful periods. Cramps are unbearable and affecting her daily life. Also has back pain. Using super tampons. Has tried heat, OTC pain meds with no relief. Cycles are monthly. Prescribed POPs November 2023 for management but had irregular bleeding, so she discontinued. Wants hysterectomy. Sister had hysterectomy for heavy periods. COCs provided in the interim, recommend Ibuprofen 800 mg every 8 hours for cramping. Normal pap and mammogram history. History of bipolar disorder, HSV - occasional outbreaks.  Today patient is on CD4 and is bent over in pain.  She reports they are heavy as well.  She is using a super tampon q 2 hours and has noticed clots. She tried IVF in the past, and could not conceive and does not want any children at this point and is frustrated with the bleeding and pain.  She is here today to discuss having a definitive management with the [facility].  She would like to preserve her ovaries. She has tried 2 different ocp's and is still having the pain and bleeding. The pain radiates from her lower pelvis all the way around to her lower back and affects her life. Motrin and tylenol has not helped as well.  Gynecologic History Patient's last menstrual period was 04/07/2024. Period Duration (Days): 3-4 Period Pattern: Regular Menstrual Flow: Heavy Menstrual Control: Maxi pad, Tampon Dysmenorrhea: (!) Severe Dysmenorrhea Symptoms: Cramping, Headache Contraception/Family planning: OCP (estrogen/progesterone) Sexually active: Yes  Health Maintenance Last Pap: 09/18/2021. Results  were: Normal neg HPV Last mammogram: 09/26/2023. Results were: Normal Last colonoscopy: 06/19/2022. Results were: Tubular adenoma, 7-year recall Last Dexa: Not indicated  Past medical history, past surgical history, family history and social history were all reviewed and documented in the EPIC chart. Married. On disability.   ROS:  A ROS was performed and pertinent positives and negatives are included.  Exam:  Vitals:   04/21/24 0959  BP: 106/80  Pulse: 77  SpO2: 97%  Weight: 181 lb (82.1 kg)  Height: 5\' 6"  (1.676 m)   Date: 02/02/2024 Department: [facility] Released By: Karen Osmond D Authorizing: Andee Bamberger, NP   Exam Status  Status  Final [99]   PACS Intelerad Image Link   Show images for US  PELVIS TRANSVAGINAL NON-OB (TV ONLY) Study Result  Narrative & Impression  Vaginal ultrasound:   Anteverted uterus, normal size and shape, 11 mm posterior intramural fibroid noted.  Thin, symmetrical endometrium - 6.34 mm.  No mass or thickening seen, avascular.     Both ovaries within normal limits.     No adnexal masses, no free fluid.      Body mass index is 29.21 kg/m.  Past Medical History:  Diagnosis Date   Anxiety    on meds   Bipolar disorder (HCC)    Depression    on meds   HSV (herpes simplex virus) anogenital infection 05/2018   RLS (restless legs syndrome)    Seasonal allergies    Past Surgical History:  Procedure Laterality Date   BARTHOLIN CYST MARSUPIALIZATION     COLONOSCOPY  06/19/2022   IVF     WISDOM TOOTH EXTRACTION     Social History   Socioeconomic History  Marital status: Married    Spouse name: [name],[name]   Number of children: 0   Years of education: 16   Highest education level: Associate degree: academic program  Occupational History   Occupation: unemployed   Occupation: disabled  Tobacco Use   Smoking status: Former    Current packs/day: 0.00    Average packs/day: 1 pack/day for  27.0 years (27.0 ttl pk-yrs)    Types: Cigarettes    Start date: 12/15/1992    Quit date: 12/16/2019    Years since quitting: 4.3   Smokeless tobacco: Never   Tobacco comments:    Started at 48 years old. 1 pack/day. Quit 2 years ago.   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    Social Drivers of Corporate investment banker Strain: Low Risk  (03/09/2023)   Overall Financial Resource Strain (CARDIA)    Difficulty of Paying Living Expenses: Not hard at all  Food Insecurity: No Food Insecurity (03/09/2023)   Hunger Vital Sign    Worried About Running Out of Food in the Last Year: Never true    Ran Out of Food in the Last Year: Never true  Transportation Needs: No Transportation Needs (03/09/2023)   PRAPARE - Administrator, Civil Service (Medical): No    Lack of Transportation (Non-Medical): No  Physical Activity: Inactive (03/09/2023)   Exercise Vital Sign    Days of Exercise per Week: 0 days    Minutes of Exercise per Session: 0 min  Stress: Stress Concern Present (03/09/2023)   [employer] of Occupational Health - Occupational Stress Questionnaire    Feeling of Stress : Rather much  Social Connections: Socially Integrated (03/09/2023)   Social Connection and Isolation Panel [NHANES]    Frequency of Communication with Friends and Family: More than three times a week    Frequency of Social Gatherings with Friends and Family: Never    Attends Religious Services: More than 4 times per year    Active Member of [employer] or Organizations: Yes    Attends Occupational hygienist Meetings: Never    Marital Status: Married   No Known Allergies Current Outpatient Medications on File Prior to Visit  Medication Sig Dispense Refill   albuterol  (VENTOLIN  HFA) 108 (90 Base) MCG/ACT inhaler Inhale 2 puffs into the lungs every 6 (six) hours as needed for wheezing or shortness of breath. 8.5 g 3   Budeson-Glycopyrrol-Formoterol  (BREZTRI  AEROSPHERE) 160-9-4.8 MCG/ACT AERO Inhale 2 puffs into the lungs 2 (two) times daily. Take 2 puffs first thing in am and then another 2 puffs about 12 hours later. 10.7 g 11   cyclobenzaprine  (FLEXERIL ) 10 MG tablet Take 2 tablets (20 mg total) by mouth at bedtime. 180 tablet 10   doxazosin  (CARDURA ) 4 MG tablet TAKE 1 AND 1/2 TABLETS AT BED TIME ( 6 MG TOTAL) 135 tablet 10   Eszopiclone  3 MG TABS Take 1 tablet (3 mg total) by mouth at bedtime. Take immediately  before bedtime 30 tablet 2   fluticasone  (FLONASE ) 50 MCG/ACT nasal spray Place 2 sprays into both nostrils daily. 16 g 6   gabapentin  (NEURONTIN ) 400 MG capsule TAKE 1 CAPSULE TWICE DAILY  AND TAKE 3 CAPSULES IN THE EVENING 450 capsule 0   lamoTRIgine  (LAMICTAL ) 150 MG tablet Take 1 tablet (150 mg total) by mouth daily. 90 tablet 1   LORazepam  (ATIVAN ) 1 MG tablet TAKE 1 TABLET BY MOUTH 2 TIMES A DAY AND 2 TABLETS AT NIGHT 120 tablet 1   lurasidone  (LATUDA ) 80 MG TABS tablet Take 1 tablet (80 mg total) by mouth 2 (two) times daily. 180 tablet 0   Multiple Vitamin (MULTIVITAMIN) tablet Take 1 tablet by mouth daily.     rOPINIRole  (REQUIP ) 2 MG tablet TAKE 1 TABLET AT 3PM 90 tablet 3   rOPINIRole  (REQUIP ) 4 MG tablet TAKE 2 TABLETS AT BEDTIME 180 tablet 3   valACYclovir  (VALTREX ) 500 MG tablet Take one tablet by mouth twice daily for 3-5 days then daily as needed. 90 tablet 1   No current facility-administered medications on file prior to visit.    Past Surgical History:  Procedure Laterality Date   BARTHOLIN CYST MARSUPIALIZATION     COLONOSCOPY  06/19/2022   IVF      WISDOM TOOTH EXTRACTION        Vaginal ultrasound: Anteverted uterus, normal size and shape, 11 mm posterior intramural fibroid noted.  Thin, symmetrical endometrium - 6.34 mm.  No mass or thickening seen, avascular.  Both ovaries within normal limits.  No adnexal masses, no free fluid.  EMB: benign pathology 02/15/24  Assessment/Plan:  48 y.o. G0 with likely endometriosis, menorrhagia, dysmenorrhea, failed hormonal management desires definitive management with RLH Symptomatic fibroid uterus:  Counseled on all options.  She is ready to have the [facility] completed.   The risks of surgery were discussed in detail with the patient including but not limited to: bleeding which may require transfusion or reoperation; infection which may require prolonged hospitalization or re-hospitalization and antibiotic therapy; injury to bowel, bladder, ureters and major vessels or other surrounding organs which may lead to other procedures; formation of adhesions; need for additional procedures including laparotomy or subsequent procedures secondary to intraoperative injury or abnormal pathology; thromboembolic phenomenon; incisional problems and other postoperative or anesthesia complications.  The postoperative expectations were also discussed in detail. The patient also understands the alternative treatment options which were discussed in full. All questions were answered.  Patient would like to proceed with the procedure.  Dr. Tia Flowers   20 minutes spent on reviewing records, imaging,  and one on one patient time and counseling patient and documentation Dr. Tia Flowers

## 2024-04-21 NOTE — H&P (View-Only) (Signed)
 Beth [name] 1976-06-24 [id number]  PREOP H&P RLH robotic assisted total hysterectomy with salpingectomy, RLH,BS, Cysto with possible excision of endometriosis and peritoneal stripping CYSTOSCOPY,EXCISION, CYST, Ovary, robot-assisted, laparoscopic    Per Antonio Klinefelter, NP last note: History:  48 y.o. G0 presents for breast and pelvic exam and ultrasound consult. Seen 01/14/2024 for heavy, painful periods. Cramps are unbearable and affecting her daily life. Also has back pain. Using super tampons. Has tried heat, OTC pain meds with no relief. Cycles are monthly. Prescribed POPs November 2023 for management but had irregular bleeding, so she discontinued. Wants hysterectomy. Sister had hysterectomy for heavy periods. COCs provided in the interim, recommend Ibuprofen 800 mg every 8 hours for cramping. Normal pap and mammogram history. History of bipolar disorder, HSV - occasional outbreaks.  Today patient is on CD4 and is bent over in pain.  She reports they are heavy as well.  She is using a super tampon q 2 hours and has noticed clots. She tried IVF in the past, and could not conceive and does not want any children at this point and is frustrated with the bleeding and pain.  She is here today to discuss having a definitive management with the [facility].  She would like to preserve her ovaries. She has tried 2 different ocp's and is still having the pain and bleeding. The pain radiates from her lower pelvis all the way around to her lower back and affects her life. Motrin and tylenol has not helped as well.  Gynecologic History Patient's last menstrual period was 04/07/2024. Period Duration (Days): 3-4 Period Pattern: Regular Menstrual Flow: Heavy Menstrual Control: Maxi pad, Tampon Dysmenorrhea: (!) Severe Dysmenorrhea Symptoms: Cramping, Headache Contraception/Family planning: OCP (estrogen/progesterone) Sexually active: Yes  Health Maintenance Last Pap: 09/18/2021. Results  were: Normal neg HPV Last mammogram: 09/26/2023. Results were: Normal Last colonoscopy: 06/19/2022. Results were: Tubular adenoma, 7-year recall Last Dexa: Not indicated  Past medical history, past surgical history, family history and social history were all reviewed and documented in the EPIC chart. Married. On disability.   ROS:  A ROS was performed and pertinent positives and negatives are included.  Exam:  Vitals:   04/21/24 0959  BP: 106/80  Pulse: 77  SpO2: 97%  Weight: 181 lb (82.1 kg)  Height: 5\' 6"  (1.676 m)   Date: 02/02/2024 Department: [facility] Released By: Karen Osmond D Authorizing: Andee Bamberger, NP   Exam Status  Status  Final [99]   PACS Intelerad Image Link   Show images for US  PELVIS TRANSVAGINAL NON-OB (TV ONLY) Study Result  Narrative & Impression  Vaginal ultrasound:   Anteverted uterus, normal size and shape, 11 mm posterior intramural fibroid noted.  Thin, symmetrical endometrium - 6.34 mm.  No mass or thickening seen, avascular.     Both ovaries within normal limits.     No adnexal masses, no free fluid.      Body mass index is 29.21 kg/m.  Past Medical History:  Diagnosis Date   Anxiety    on meds   Bipolar disorder (HCC)    Depression    on meds   HSV (herpes simplex virus) anogenital infection 05/2018   RLS (restless legs syndrome)    Seasonal allergies    Past Surgical History:  Procedure Laterality Date   BARTHOLIN CYST MARSUPIALIZATION     COLONOSCOPY  06/19/2022   IVF     WISDOM TOOTH EXTRACTION     Social History   Socioeconomic History  Marital status: Married    Spouse name: [name],[name]   Number of children: 0   Years of education: 16   Highest education level: Associate degree: academic program  Occupational History   Occupation: unemployed   Occupation: disabled  Tobacco Use   Smoking status: Former    Current packs/day: 0.00    Average packs/day: 1 pack/day for  27.0 years (27.0 ttl pk-yrs)    Types: Cigarettes    Start date: 12/15/1992    Quit date: 12/16/2019    Years since quitting: 4.3   Smokeless tobacco: Never   Tobacco comments:    Started at 48 years old. 1 pack/day. Quit 2 years ago.   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    Social Drivers of Corporate investment banker Strain: Low Risk  (03/09/2023)   Overall Financial Resource Strain (CARDIA)    Difficulty of Paying Living Expenses: Not hard at all  Food Insecurity: No Food Insecurity (03/09/2023)   Hunger Vital Sign    Worried About Running Out of Food in the Last Year: Never true    Ran Out of Food in the Last Year: Never true  Transportation Needs: No Transportation Needs (03/09/2023)   PRAPARE - Administrator, Civil Service (Medical): No    Lack of Transportation (Non-Medical): No  Physical Activity: Inactive (03/09/2023)   Exercise Vital Sign    Days of Exercise per Week: 0 days    Minutes of Exercise per Session: 0 min  Stress: Stress Concern Present (03/09/2023)   [employer] of Occupational Health - Occupational Stress Questionnaire    Feeling of Stress : Rather much  Social Connections: Socially Integrated (03/09/2023)   Social Connection and Isolation Panel [NHANES]    Frequency of Communication with Friends and Family: More than three times a week    Frequency of Social Gatherings with Friends and Family: Never    Attends Religious Services: More than 4 times per year    Active Member of [employer] or Organizations: Yes    Attends Occupational hygienist Meetings: Never    Marital Status: Married   No Known Allergies Current Outpatient Medications on File Prior to Visit  Medication Sig Dispense Refill   albuterol  (VENTOLIN  HFA) 108 (90 Base) MCG/ACT inhaler Inhale 2 puffs into the lungs every 6 (six) hours as needed for wheezing or shortness of breath. 8.5 g 3   Budeson-Glycopyrrol-Formoterol  (BREZTRI  AEROSPHERE) 160-9-4.8 MCG/ACT AERO Inhale 2 puffs into the lungs 2 (two) times daily. Take 2 puffs first thing in am and then another 2 puffs about 12 hours later. 10.7 g 11   cyclobenzaprine  (FLEXERIL ) 10 MG tablet Take 2 tablets (20 mg total) by mouth at bedtime. 180 tablet 10   doxazosin  (CARDURA ) 4 MG tablet TAKE 1 AND 1/2 TABLETS AT BED TIME ( 6 MG TOTAL) 135 tablet 10   Eszopiclone  3 MG TABS Take 1 tablet (3 mg total) by mouth at bedtime. Take immediately  before bedtime 30 tablet 2   fluticasone  (FLONASE ) 50 MCG/ACT nasal spray Place 2 sprays into both nostrils daily. 16 g 6   gabapentin  (NEURONTIN ) 400 MG capsule TAKE 1 CAPSULE TWICE DAILY  AND TAKE 3 CAPSULES IN THE EVENING 450 capsule 0   lamoTRIgine  (LAMICTAL ) 150 MG tablet Take 1 tablet (150 mg total) by mouth daily. 90 tablet 1   LORazepam  (ATIVAN ) 1 MG tablet TAKE 1 TABLET BY MOUTH 2 TIMES A DAY AND 2 TABLETS AT NIGHT 120 tablet 1   lurasidone  (LATUDA ) 80 MG TABS tablet Take 1 tablet (80 mg total) by mouth 2 (two) times daily. 180 tablet 0   Multiple Vitamin (MULTIVITAMIN) tablet Take 1 tablet by mouth daily.     rOPINIRole  (REQUIP ) 2 MG tablet TAKE 1 TABLET AT 3PM 90 tablet 3   rOPINIRole  (REQUIP ) 4 MG tablet TAKE 2 TABLETS AT BEDTIME 180 tablet 3   valACYclovir  (VALTREX ) 500 MG tablet Take one tablet by mouth twice daily for 3-5 days then daily as needed. 90 tablet 1   No current facility-administered medications on file prior to visit.    Past Surgical History:  Procedure Laterality Date   BARTHOLIN CYST MARSUPIALIZATION     COLONOSCOPY  06/19/2022   IVF      WISDOM TOOTH EXTRACTION        Vaginal ultrasound: Anteverted uterus, normal size and shape, 11 mm posterior intramural fibroid noted.  Thin, symmetrical endometrium - 6.34 mm.  No mass or thickening seen, avascular.  Both ovaries within normal limits.  No adnexal masses, no free fluid.  EMB: benign pathology 02/15/24  Assessment/Plan:  48 y.o. G0 with likely endometriosis, menorrhagia, dysmenorrhea, failed hormonal management desires definitive management with RLH Symptomatic fibroid uterus:  Counseled on all options.  She is ready to have the [facility] completed.   The risks of surgery were discussed in detail with the patient including but not limited to: bleeding which may require transfusion or reoperation; infection which may require prolonged hospitalization or re-hospitalization and antibiotic therapy; injury to bowel, bladder, ureters and major vessels or other surrounding organs which may lead to other procedures; formation of adhesions; need for additional procedures including laparotomy or subsequent procedures secondary to intraoperative injury or abnormal pathology; thromboembolic phenomenon; incisional problems and other postoperative or anesthesia complications.  The postoperative expectations were also discussed in detail. The patient also understands the alternative treatment options which were discussed in full. All questions were answered.  Patient would like to proceed with the procedure.  Dr. Tia Flowers   20 minutes spent on reviewing records, imaging,  and one on one patient time and counseling patient and documentation Dr. Tia Flowers

## 2024-04-21 NOTE — Patient Instructions (Addendum)
 Robotic Laparoscopic Hysterectomy, Care After  The following information offers guidance on how to care for yourself after your procedure. Your health care provider may also give you more specific instructions. If you have problems or questions, contact your health care provider. What can I expect after the procedure? After the procedure, it is common to have: Pain, bruising, and numbness around your incisions. Tiredness (fatigue). Poor appetite. Less interest in sex. Vaginal discharge or bleeding. You will need to use a sanitary pad after this procedure.  HEAVY BLEEDING LIKE A PERIOD IS NOT NORMAL.  PLEASE CALL YOUR PROVIDER IF SOAKING A PAD. Feelings of sadness or other emotions. If your ovaries were also removed, it is also common to have symptoms of menopause, such as hot flashes, night sweats, and lack of sleep (insomnia).  Ovaries should stay in if at all possible until at least the age of 107. Follow these instructions at home: Medicines Take over-the-counter and prescription medicines only as told by your health care provider. Ask your health care provider if the medicine prescribed to you: Requires you to avoid driving or using machinery. Can cause constipation. You may need to take these actions to prevent or treat constipation: Drink enough fluid to keep your urine pale yellow. Take over-the-counter or prescription medicines. Eat foods that are high in fiber, such as beans, whole grains, and fresh fruits and vegetables. Limit foods that are high in fat and processed sugars, such as fried or sweet foods.  Also, avoid spicy foods.  NAUSEA IS COMMON THE FIRST NIGHT OF SURGERY.  IF IT LASTS BEYOND 24 HOURS, CALL YOUR PROVIDER.  NAUSEA MEDICATION WAS GIVEN AT YOUR PREOP APPOINTMENT THAT YOU CAN TAKE AFTER SURGERY. Incision care  Follow instructions from your health care provider about how to take care of your incisions. Make sure you: LEAVE INCISION OPEN AND DRY-NO BANDAGES Leave  stitches (sutures), skin glue, or adhesive strips in place UNTIL 2 WEEKS THEN REMOVE IN THE SHOWER.  If adhesive strip edges start to loosen and curl up, you may trim the loose edges. Check your incision areas every day for signs of infection. Check for: More redness, swelling, or pain. Fluid or blood. Warmth. Pus or a bad smell. Activity  Rest as told by your health care provider. Avoid sitting for a long time without moving. Get up to take short walks every 1-2 hours. This is important to improve blood flow and breathing. Ask for help if you feel weak or unsteady. Return to your normal activities as told by your health care provider. Ask your health care provider what activities are safe for you. Do not lift anything that is heavier than 13lbs or the limit that you are told, for 6 WEEKS after surgery or until your health care provider says that it is safe. If you were given a sedative during the procedure, it can affect you for several hours. Do not drive or operate machinery until your health care provider says that it is safe. Lifestyle Do not use any products that contain nicotine or tobacco. These products include cigarettes, chewing tobacco, and vaping devices, such as e-cigarettes. These can delay healing after surgery. If you need help quitting, ask your health care provider. Do not drink alcohol until your health care provider approves. General instructions FOR 2 WEEKS AFTER SURGERY, THEN YOU MAY USE TUBS AND HOT TUBS OR SWIM Do not douche, use tampons, or have sex for at least 10 weeks, or as told by your health care provider.  If you struggle with physical or emotional changes after your procedure, speak with your health care provider or a therapist. Do not take baths, swim, or use a hot tub until your health care provider approves. You may only be allowed to take showers for 2 weeks. IF YOU HAVE BURNING WITH URINATION, PLEASE CALL YOUR DOCTOR. BLADDER INFECTIONS MAY OCCUR AFTER  SURGERY Try to have someone at home with you for the first 1-2 weeks to help with your daily chores. Wear compression stockings as told by your health care provider. These stockings help to prevent blood clots and reduce swelling in your legs. Keep all follow-up visits. This is important. Contact a health care provider if: You have any of these signs of infection: Chills or a fever 150f OR GREATER. More redness, swelling, or pain around an incision. Fluid or blood coming from an incision. Warmth coming from an incision. Pus or a bad smell coming from an incision. Burning with urination. Urinary frequency or cramping.   IF YOU HAVE THESE SYMPTOMS, PLEASE CALL THE OFFICE TO COME EVALUATE FOR A BLADDER INFECTION AT [phone] An incision opens. You feel dizzy or light-headed. You have pain or bleeding when you urinate, or you are unable to urinate. You have abnormal vaginal discharge. You have pain that does not get better with medicine. Get help right away if: You have a fever and your symptoms suddenly get worse. You have severe abdominal pain. You have chest pain or shortness of breath. You may have chest pain and shortness of breath from the CO2 gas for a few days after surgery.  This is very common.  Walking, Gas-X and motrin will usually help relieve this discomfort You faint. You have pain, swelling, or redness in your leg. You have heavy vaginal bleeding with blood clots, soaking through a sanitary pad in less than 1 hour. These symptoms may represent a serious problem that is an emergency. Do not wait to see if the symptoms will go away. Get medical help right away. Call your local emergency services (911 in the [place] Do not drive yourself to the hospital. Summary  CONSTIPATION MEDICATION AFTER SURGERY: COLACE, MOM, MIRALAX, GAS X are all helpful to have on hand, if needed.  FILL ALL POSTOP MEDICATION BEFORE SURGERY  After the procedure, it is common to have pain and bruising  around your incisions. Do not take baths, swim, or use a hot tub until your health care provider approves. Do not lift anything that is heavier than 8- 10 lb (4.5 kg), or the limit that you are told, for one month after surgery or until your health care provider says that it is safe. Tell your health care provider if you have any signs or symptoms of infection after the procedure. Get help right away if you have severe abdominal pain, chest pain, shortness of breath, or heavy bleeding from your vagina. This information is not intended to replace advice given to you by your  health care provider. Make sure you discuss any questions you have with your health care provider. Document Revised: 08/02/2020 Document Reviewed: 08/03/2020 Elsevier Patient Education  2024 Elsevier Inc.  You may begin intrarosa now and after surgery to help with vaginal cuff healing.  This is provided through myscript

## 2024-04-25 ENCOUNTER — Telehealth: Payer: Self-pay | Admitting: Psychiatry

## 2024-04-25 NOTE — Telephone Encounter (Signed)
 Next appt is 06/14/24. Beth [name] called regarding her Cardura  for PTSD. She states its making her ramp up and has a hard time sleeping. Could someone please call her at [phone].  Pharmacy is:  [facility] - [place], [place] - [address]   Phone: [phone]  Fax: [phone]

## 2024-04-26 ENCOUNTER — Telehealth: Payer: Self-pay

## 2024-04-26 MED ORDER — CLONIDINE HCL 0.1 MG PO TABS: ORAL_TABLET | ORAL | 0 refills | Status: DC

## 2024-04-26 NOTE — Telephone Encounter (Signed)
 PA needed for intrarosa  6.5mg  inserts. Please list diagnosis code

## 2024-04-26 NOTE — Telephone Encounter (Signed)
 Recommendations reviewed with patient. Also sent via MyChart. Rx sent to [facility].

## 2024-04-26 NOTE — Telephone Encounter (Signed)
 Pt reporting Cardura  is making her anxious in the AM. Has been on for a long time and doesn't know why having issues now. No new stressors. She gets to sleep ok, but if she wakes up for NM she can't get back to sleep because her legs hurt from RLS.   Reduce sertraline  to 25 mg daily for 1 week and then stop it. Wait 2 more weeks to evaluate whether cycling is better. Reports no improvement in cycling.   Only taking 120 mg Latuda  and not 160. Rx said 80 mg BID. Reports taking 120 mg once a day.   If cycling is not better , then increase Latuda  to 160 mg daily.  If Latuda  10 fails then retry Caplyta and wean Latuda . Lamictal  150  Continue cyclobenzaprine  20 mg nightly for RLS Continue doxazosin  6 mg nightly For RLS gabapentin  400 mg twice daily and 1200 mg nightly Continue lorazepam  1 mg BID  and 2 tabs HS ropinirole  2 mg at 3 PM and 8 mg nightly. High dose necessary to control RLS

## 2024-04-26 NOTE — Telephone Encounter (Signed)
 He is highly unlikely that doxazosin  is making her anxious in the morning but I do have an alternative.  It is more likely that her anxiety is worse because of stopping the sertraline  which was prescribed for anxiety.  However we stopped the sertraline  because we were concerned at mites be associated with mood cycling.  Therefore I do not want to resume the sertraline .  The other option for replacing doxazosin  that helps both nightmares and it has an antianxiety effect his clonidine.  Clonidine can also sometimes help restless legs which has been a problem that she has had.  However to help restless legs it typically has to get to a higher dose than we would initially start.  If she agrees to this plan cut the doxazosin  dose in half and add clonidine 0.1 mg tablet at night for 5 to 7 days and then stop the doxazosin  and increase the clonidine to 2 tablets in the evening.   Please send in the clonidine prescription. #60 , no refill

## 2024-04-27 NOTE — Telephone Encounter (Signed)
 PA submitted for intrarosa  6.5mg  vaginal insert Key: [id number] Patient istates it is already being shipped to her & that the coupon must have been applied to it. Patient is aware it could take several days for a response from the PA & that I will only contact her if they approve it since it is already being shipped to her.

## 2024-04-28 ENCOUNTER — Encounter: Payer: Self-pay | Admitting: *Deleted

## 2024-04-28 NOTE — Telephone Encounter (Signed)
 PA denied. This encounter is closed.

## 2024-04-29 ENCOUNTER — Telehealth: Payer: Self-pay | Admitting: Psychiatry

## 2024-04-29 NOTE — Telephone Encounter (Signed)
 Pt Lvm @ 1:01p.  She states for the past few days she has been in a manic state.  She has been "screaming, yelling, wanting to punch everything".  She thinks the doxazosin  is not helping and she is "willing to take anything,even Seroquel " to stop it.  Next appt 7/1

## 2024-04-29 NOTE — Telephone Encounter (Signed)
 Please see message from patient.  It was recommended on 5/12. She reports she has been taking as recommended. No SI.   He is highly unlikely that doxazosin  is making her anxious in the morning but I do have an alternative.  It is more likely that her anxiety is worse because of stopping the sertraline  which was prescribed for anxiety.  However we stopped the sertraline  because we were concerned at mites be associated with mood cycling.  Therefore I do not want to resume the sertraline .   The other option for replacing doxazosin  that helps both nightmares and it has an antianxiety effect his clonidine.  Clonidine can also sometimes help restless legs which has been a problem that she has had.  However to help restless legs it typically has to get to a higher dose than we would initially start.   If she agrees to this plan cut the doxazosin  dose in half and add clonidine 0.1 mg tablet at night for 5 to 7 days and then stop the doxazosin  and increase the clonidine to 2 tablets in the evening.   Please send in the clonidine prescription. #60 , no refill

## 2024-05-02 ENCOUNTER — Encounter: Payer: Self-pay | Admitting: Obstetrics and Gynecology

## 2024-05-03 ENCOUNTER — Other Ambulatory Visit: Payer: Self-pay | Admitting: Psychiatry

## 2024-05-03 DIAGNOSIS — F4310 Post-traumatic stress disorder, unspecified: Secondary | ICD-10-CM

## 2024-05-03 DIAGNOSIS — F411 Generalized anxiety disorder: Secondary | ICD-10-CM

## 2024-05-03 NOTE — Telephone Encounter (Signed)
 Did you reach her with my message and did she agree?

## 2024-05-03 NOTE — Telephone Encounter (Signed)
 I did reach her. It was documented on another msg on 5/13. She did agree and Rx was sent. I will FU with her.

## 2024-05-03 NOTE — Telephone Encounter (Signed)
 Followed up with patient. She sounded bright, but reports no change in manic sx.   She reports tonight is the first night she will not take the doxazosin  and will take 2 tablets of clonidine . I told her to let us  now if there was a change, good or bad, but that I would FU with her again in a couple of days.

## 2024-05-10 ENCOUNTER — Encounter

## 2024-05-11 ENCOUNTER — Encounter (HOSPITAL_COMMUNITY): Payer: Self-pay | Admitting: Obstetrics and Gynecology

## 2024-05-11 NOTE — Progress Notes (Signed)
 Spoke w/ via phone for pre-op interview---  pt Lab needs dos----  urine preg       Lab results------ lab appt 05-16-2024 @ 1000 getting CBC/ BMP/ T&S/ EKG COVID test -----patient states asymptomatic no test needed Arrive at -------  0630 on 05-18-2024 NPO after MN w// exception sips of water w/ meds Pre-Surgery Ensure or G2: n/a  Med rec completed Medications to take morning of surgery ----- breztri  inhaler Diabetic medication -----  n/a  GLP1 agonist last dose:n/a GLP1 instructions:  Patient instructed no nail polish to be worn day of surgery Patient instructed to bring photo id and insurance card day of surgery Patient aware to have Driver (ride ) / caregiver    for 24 hours after surgery - sister, Rosslyn Coons Patient Special Instructions ----- will pick up bag w/ soap and written instructions at lab appt Pre-Op special Instructions -----  asked to bring rescue inhaler dos  Patient verbalized understanding of instructions that were given at this phone interview. Patient denies chest pain, sob, fever, cough at the interview.

## 2024-05-11 NOTE — Pre-Procedure Instructions (Signed)
 Surgical Instructions   Your procedure is scheduled on :  Wednesday,  05-18-2024. Report to [facility] Main Entrance "A" at 6:30  A.M., then check in with the Admitting office. Any questions or running late day of surgery: call [phone]  Questions prior to your surgery date: call [phone], Monday-Friday, 8am-4pm. If you experience any cold or flu symptoms such as cough, fever, chills, shortness of breath, etc. between now and your scheduled surgery, please notify your surgeon office.    Remember:  Do not eat any food and do not drink any liquids after midnight the night before your surgery.  This includes no water,  candy,  gum,  and mints.    Take these medicines the morning of surgery with A SIPS OF WATER : Budeson-glycopyrrol-formoterol  (breztri  aerosphere)  inhaler   May take these medicines IF NEEDED: Fluticasone  (flonase ) nasal spray Albuterol  (ventolin ) inhaler  ~~~ Please bring your albuterol  rescue inhaler with you day of surgery  One week prior to surgery, STOP taking any Aspirin (unless otherwise instructed by your surgeon) Aleve, Naproxen, Ibuprofen , Motrin , Advil , Goody's, BC's, all herbal medications, fish oil, and non-prescription vitamins.                     Do NOT Smoke (Tobacco/Vaping) and Do Not drink alcohol for 24 hours prior to your procedure.  If you use a CPAP at night, you may bring your mask/headgear for your overnight stay.   You will be asked to remove any contacts, glasses, piercing's, hearing aid's, dentures/partials prior to surgery. Please bring cases for these items if needed.    Patients discharged the day of surgery will not be allowed to drive home, and someone needs to stay with them for 24 hours.  SURGICAL WAITING ROOM VISITATION Patients may have no more than 2 support people in the waiting area - these visitors may rotate.   Pre-op nurse will coordinate an appropriate time for 1 ADULT support person, who may not rotate, to  accompany patient in pre-op.  Children under the age of 64 must have an adult with them who is not the patient and must remain in the main waiting area with an adult.  If the patient needs to stay at the hospital during part of their recovery, the visitor guidelines for inpatient rooms apply.  Please refer to the [facility] website for the visitor guidelines for any additional information.   If you received a COVID test during your pre-op visit  it is requested that you wear a mask when out in public, stay away from anyone that may not be feeling well and notify your surgeon if you develop symptoms. If you have been in contact with anyone that has tested positive in the last 10 days please notify you surgeon.      Pre-operative CHG Bathing Instructions   You can play a key role in reducing the risk of infection after surgery. Your skin needs to be as free of germs as possible. You can reduce the number of germs on your skin by washing with CHG (chlorhexidine gluconate) soap before surgery. CHG is an antiseptic soap that kills germs and continues to kill germs even after washing.   DO NOT use if you have an allergy to chlorhexidine/CHG or antibacterial soaps. If your skin becomes reddened or irritated, stop using the CHG and notify Pre-Op nurse day of surgery.  ~~ Please get dial soap or other antibacterial soap and shower following the instructions below  TAKE A SHOWER THE NIGHT BEFORE SURGERY AND THE DAY OF SURGERY    Please keep in mind the following:  DO NOT shave, including legs and underarms, 48 hours prior to surgery.   You may shave your face before/day of surgery.  Place clean sheets on your bed the night before surgery Use a clean washcloth (not used since being washed) for each shower. DO NOT sleep with pet's night before surgery.  CHG Shower Instructions:  Wash your face and private area with normal soap. If you choose to wash your hair, wash first with your  normal shampoo.  After you use shampoo/soap, rinse your hair and body thoroughly to remove shampoo/soap residue.  Turn the water OFF and apply half the bottle of CHG soap to a CLEAN washcloth.  Apply CHG soap ONLY FROM YOUR NECK DOWN TO YOUR TOES (washing for 3-5 minutes)  DO NOT use CHG soap on face, private areas, open wounds, or sores.  Pay special attention to the area where your surgery is being performed.  If you are having back surgery, having someone wash your back for you may be helpful. Wait 2 minutes after CHG soap is applied, then you may rinse off the CHG soap.  Pat dry with a clean towel  Put on clean pajamas    Additional instructions for the day of surgery: DO NOT APPLY any lotions, powder,  oils,  deodorants (may use underarm deodorant)  , cologne/  perfumes  or makeup  Do not wear jewelry /  piercing's/  metal/  permanent jewelry must be removed prior to arrival day of surgery.  (No plastic piercing) Do not wear nail polish, gel polish, artificial nails, or any other type of covering on natural finger nails (toe nails are okay) Do not bring valuables to the hospital. [facility] is not responsible for valuables/personal belongings. Put on clean/comfortable clothes.  Please brush your teeth.  Ask your nurse before applying any prescription medications to the skin.

## 2024-05-12 ENCOUNTER — Other Ambulatory Visit: Payer: Self-pay | Admitting: Psychiatry

## 2024-05-12 DIAGNOSIS — F5105 Insomnia due to other mental disorder: Secondary | ICD-10-CM

## 2024-05-12 DIAGNOSIS — G2581 Restless legs syndrome: Secondary | ICD-10-CM

## 2024-05-13 ENCOUNTER — Telehealth: Payer: Self-pay | Admitting: Psychiatry

## 2024-05-13 NOTE — Telephone Encounter (Signed)
 Pt LVM @ 1;38p asking for Clonidine  to be sent to   [facility] - [place], [place] - [address] [place] [address] Phone: [phone]  Fax: [phone]   Next appt 7/1

## 2024-05-14 MED ORDER — CLONIDINE HCL 0.1 MG PO TABS: 0.2000 mg | ORAL_TABLET | Freq: Every day | ORAL | 0 refills | Status: DC

## 2024-05-14 NOTE — Telephone Encounter (Signed)
 Rx for clonidine  0.2 mg sent to [facility].

## 2024-05-16 ENCOUNTER — Ambulatory Visit: Payer: Self-pay | Admitting: Obstetrics and Gynecology

## 2024-05-16 ENCOUNTER — Encounter (HOSPITAL_COMMUNITY)
Admission: RE | Admit: 2024-05-16 | Discharge: 2024-05-16 | Disposition: A | Discharge status: Home / Self Care | Source: Ambulatory Visit | Attending: Obstetrics and Gynecology | Admitting: Obstetrics and Gynecology

## 2024-05-16 DIAGNOSIS — Z01818 Encounter for other preprocedural examination: Secondary | ICD-10-CM | POA: Insufficient documentation

## 2024-05-16 LAB — BASIC METABOLIC PANEL WITH GFR
Anion gap: 9 (ref 5–15)
BUN: 11 mg/dL (ref 6–20)
CO2: 23 mmol/L (ref 22–32)
Calcium: 9.1 mg/dL (ref 8.9–10.3)
Chloride: 102 mmol/L (ref 98–111)
Creatinine, Ser: 0.91 mg/dL (ref 0.44–1.00)
GFR, Estimated: >60 mL/min (ref >60)
Glucose, Bld: 102 mg/dL — ABNORMAL HIGH (ref 70–99)
Potassium: 4 mmol/L (ref 3.5–5.1)
Sodium: 134 mmol/L — ABNORMAL LOW (ref 135–145)

## 2024-05-16 LAB — CBC
HCT: 43.2 % (ref 36.0–46.0)
Hemoglobin: 14.7 g/dL (ref 12.0–15.0)
MCH: 31.2 pg (ref 26.0–34.0)
MCHC: 34 g/dL (ref 30.0–36.0)
MCV: 91.7 fL (ref 80.0–100.0)
Platelets: 338 10*3/uL (ref 150–400)
RBC: 4.71 MIL/uL (ref 3.87–5.11)
RDW: 12.1 % (ref 11.5–15.5)
WBC: 6.1 10*3/uL (ref 4.0–10.5)
nRBC: 0 % (ref 0.0–0.2)

## 2024-05-16 LAB — TYPE AND SCREEN
ABO/RH(D): A POS
Antibody Screen: NEGATIVE

## 2024-05-17 MED ORDER — SODIUM CHLORIDE 0.9 % IV SOLN
Freq: Once | INTRAVENOUS | Status: DC
  Filled 2024-05-17: qty 10

## 2024-05-18 ENCOUNTER — Encounter (HOSPITAL_COMMUNITY)
Admission: RE | Disposition: A | Discharge status: Home / Self Care | Payer: Self-pay | Source: Home / Self Care | Attending: Obstetrics and Gynecology

## 2024-05-18 ENCOUNTER — Ambulatory Visit (HOSPITAL_COMMUNITY): Payer: Self-pay | Admitting: Anesthesiology

## 2024-05-18 ENCOUNTER — Other Ambulatory Visit: Payer: Self-pay

## 2024-05-18 ENCOUNTER — Ambulatory Visit (HOSPITAL_COMMUNITY)
Admission: RE | Admit: 2024-05-18 | Discharge: 2024-05-18 | Disposition: A | Discharge status: Home / Self Care | Attending: Obstetrics and Gynecology | Admitting: Obstetrics and Gynecology

## 2024-05-18 ENCOUNTER — Encounter (HOSPITAL_COMMUNITY): Payer: Self-pay | Admitting: Obstetrics and Gynecology

## 2024-05-18 DIAGNOSIS — D251 Intramural leiomyoma of uterus: Secondary | ICD-10-CM | POA: Insufficient documentation

## 2024-05-18 DIAGNOSIS — F319 Bipolar disorder, unspecified: Secondary | ICD-10-CM | POA: Insufficient documentation

## 2024-05-18 DIAGNOSIS — N858 Other specified noninflammatory disorders of uterus: Secondary | ICD-10-CM | POA: Diagnosis not present

## 2024-05-18 DIAGNOSIS — J449 Chronic obstructive pulmonary disease, unspecified: Secondary | ICD-10-CM | POA: Insufficient documentation

## 2024-05-18 DIAGNOSIS — Z79624 Long term (current) use of inhibitors of nucleotide synthesis: Secondary | ICD-10-CM | POA: Diagnosis not present

## 2024-05-18 DIAGNOSIS — F419 Anxiety disorder, unspecified: Secondary | ICD-10-CM | POA: Insufficient documentation

## 2024-05-18 DIAGNOSIS — N8003 Adenomyosis of the uterus: Secondary | ICD-10-CM | POA: Insufficient documentation

## 2024-05-18 DIAGNOSIS — Z9889 Other specified postprocedural states: Secondary | ICD-10-CM

## 2024-05-18 DIAGNOSIS — Z87891 Personal history of nicotine dependence: Secondary | ICD-10-CM | POA: Insufficient documentation

## 2024-05-18 DIAGNOSIS — N920 Excessive and frequent menstruation with regular cycle: Secondary | ICD-10-CM | POA: Diagnosis present

## 2024-05-18 DIAGNOSIS — N946 Dysmenorrhea, unspecified: Secondary | ICD-10-CM | POA: Diagnosis not present

## 2024-05-18 DIAGNOSIS — D649 Anemia, unspecified: Secondary | ICD-10-CM | POA: Insufficient documentation

## 2024-05-18 DIAGNOSIS — G2581 Restless legs syndrome: Secondary | ICD-10-CM | POA: Diagnosis not present

## 2024-05-18 DIAGNOSIS — F418 Other specified anxiety disorders: Secondary | ICD-10-CM | POA: Diagnosis not present

## 2024-05-18 DIAGNOSIS — K219 Gastro-esophageal reflux disease without esophagitis: Secondary | ICD-10-CM | POA: Diagnosis not present

## 2024-05-18 DIAGNOSIS — Z79899 Other long term (current) drug therapy: Secondary | ICD-10-CM | POA: Insufficient documentation

## 2024-05-18 DIAGNOSIS — Z01818 Encounter for other preprocedural examination: Secondary | ICD-10-CM

## 2024-05-18 HISTORY — DX: Cough variant asthma: J45.991

## 2024-05-18 HISTORY — DX: Gastro-esophageal reflux disease without esophagitis: K21.9

## 2024-05-18 HISTORY — DX: Presence of spectacles and contact lenses: Z97.3

## 2024-05-18 HISTORY — DX: Generalized anxiety disorder: F41.1

## 2024-05-18 HISTORY — DX: Chronic obstructive pulmonary disease, unspecified: J44.9

## 2024-05-18 HISTORY — DX: Other seasonal allergic rhinitis: J30.2

## 2024-05-18 HISTORY — DX: Post-traumatic stress disorder, chronic: F43.12

## 2024-05-18 HISTORY — DX: Personal history of adenomatous and serrated colon polyps: Z86.0101

## 2024-05-18 HISTORY — PX: ROBOTIC ASSISTED TOTAL HYSTERECTOMY WITH SALPINGECTOMY: SHX6679

## 2024-05-18 HISTORY — DX: Post-traumatic stress disorder, unspecified: F43.10

## 2024-05-18 HISTORY — PX: CYSTOSCOPY: SHX5120

## 2024-05-18 HISTORY — DX: Insomnia, unspecified: G47.00

## 2024-05-18 HISTORY — DX: Bipolar disorder, current episode depressed, moderate: F31.32

## 2024-05-18 LAB — ABO/RH: ABO/RH(D): A POS

## 2024-05-18 LAB — POCT PREGNANCY, URINE: Preg Test, Ur: NEGATIVE

## 2024-05-18 SURGERY — ROBOTIC ASSISTED TOTAL HYSTERECTOMY WITH SALPINGECTOMY
Anesthesia: General | Site: Bladder

## 2024-05-18 MED ORDER — SCOPOLAMINE 1 MG/3DAYS TD PT72
MEDICATED_PATCH | TRANSDERMAL | Status: AC
  Filled 2024-05-18: qty 1

## 2024-05-18 MED ORDER — MIDAZOLAM HCL 2 MG/2ML IJ SOLN
INTRAMUSCULAR | Status: AC
  Filled 2024-05-18: qty 2

## 2024-05-18 MED ORDER — FENTANYL CITRATE (PF) 250 MCG/5ML IJ SOLN
INTRAMUSCULAR | Status: DC | PRN
Start: 2024-05-18 — End: 2024-05-18
  Administered 2024-05-18: 50 ug via INTRAVENOUS
  Administered 2024-05-18 (×2): 25 ug via INTRAVENOUS
  Administered 2024-05-18: 50 ug via INTRAVENOUS

## 2024-05-18 MED ORDER — MIDAZOLAM HCL 2 MG/2ML IJ SOLN
INTRAMUSCULAR | Status: DC | PRN
  Administered 2024-05-18: 2 mg via INTRAVENOUS

## 2024-05-18 MED ORDER — SUGAMMADEX SODIUM 200 MG/2ML IV SOLN
INTRAVENOUS | Status: DC | PRN
  Administered 2024-05-18: 150 mg via INTRAVENOUS

## 2024-05-18 MED ORDER — DEXAMETHASONE SODIUM PHOSPHATE 10 MG/ML IJ SOLN
INTRAMUSCULAR | Status: DC | PRN
  Administered 2024-05-18: 10 mg via INTRAVENOUS

## 2024-05-18 MED ORDER — PROPOFOL 10 MG/ML IV BOLUS
INTRAVENOUS | Status: AC
  Filled 2024-05-18: qty 20

## 2024-05-18 MED ORDER — BUPIVACAINE HCL (PF) 0.5 % IJ SOLN
INTRAMUSCULAR | Status: AC
  Filled 2024-05-18: qty 30

## 2024-05-18 MED ORDER — DEXMEDETOMIDINE HCL IN NACL 80 MCG/20ML IV SOLN
INTRAVENOUS | Status: DC | PRN
  Administered 2024-05-18 (×2): 8 ug via INTRAVENOUS
  Administered 2024-05-18: 4 ug via INTRAVENOUS

## 2024-05-18 MED ORDER — 0.9 % SODIUM CHLORIDE (POUR BTL) OPTIME
TOPICAL | Status: DC | PRN
Start: 2024-05-18 — End: 2024-05-18
  Administered 2024-05-18: 1000 mL

## 2024-05-18 MED ORDER — LIDOCAINE 2% (20 MG/ML) 5 ML SYRINGE
INTRAMUSCULAR | Status: DC | PRN
  Administered 2024-05-18: 40 mg via INTRAVENOUS

## 2024-05-18 MED ORDER — HYDROMORPHONE HCL 1 MG/ML IJ SOLN
0.2500 mg | INTRAMUSCULAR | Status: DC | PRN
  Administered 2024-05-18: 0.25 mg via INTRAVENOUS

## 2024-05-18 MED ORDER — FENTANYL CITRATE (PF) 250 MCG/5ML IJ SOLN
INTRAMUSCULAR | Status: AC
Start: 2024-05-18 — End: ?
  Filled 2024-05-18: qty 5

## 2024-05-18 MED ORDER — SODIUM CHLORIDE 0.9 % IV SOLN
INTRAVENOUS | Status: AC
  Filled 2024-05-18: qty 2

## 2024-05-18 MED ORDER — CHLORHEXIDINE GLUCONATE 0.12 % MT SOLN
OROMUCOSAL | Status: DC
Start: 2024-05-18 — End: 2024-05-18
  Filled 2024-05-18: qty 15

## 2024-05-18 MED ORDER — LACTATED RINGERS IV SOLN: INTRAVENOUS | Status: DC

## 2024-05-18 MED ORDER — OXYCODONE HCL 5 MG PO TABS
5.0000 mg | ORAL_TABLET | Freq: Once | ORAL | Status: AC | PRN
  Administered 2024-05-18: 5 mg via ORAL

## 2024-05-18 MED ORDER — MEPERIDINE HCL 25 MG/ML IJ SOLN: 6.2500 mg | INTRAMUSCULAR | Status: DC | PRN

## 2024-05-18 MED ORDER — POVIDONE-IODINE 10 % EX SWAB: 2.0000 | Freq: Once | CUTANEOUS | Status: DC

## 2024-05-18 MED ORDER — HYDROMORPHONE HCL 1 MG/ML IJ SOLN
INTRAMUSCULAR | Status: AC
Start: 2024-05-18 — End: ?
  Filled 2024-05-18: qty 1

## 2024-05-18 MED ORDER — DEXAMETHASONE SODIUM PHOSPHATE 10 MG/ML IJ SOLN
INTRAMUSCULAR | Status: AC
  Filled 2024-05-18: qty 1

## 2024-05-18 MED ORDER — ACETAMINOPHEN 500 MG PO TABS
ORAL_TABLET | ORAL | Status: AC
  Administered 2024-05-18: 500 mg
  Filled 2024-05-18: qty 2

## 2024-05-18 MED ORDER — LIDOCAINE 2% (20 MG/ML) 5 ML SYRINGE
INTRAMUSCULAR | Status: AC
  Filled 2024-05-18: qty 5

## 2024-05-18 MED ORDER — ORAL CARE MOUTH RINSE: 15.0000 mL | Freq: Once | OROMUCOSAL | Status: AC

## 2024-05-18 MED ORDER — DROPERIDOL 2.5 MG/ML IJ SOLN: 0.6250 mg | Freq: Once | INTRAMUSCULAR | Status: DC | PRN

## 2024-05-18 MED ORDER — KETOROLAC TROMETHAMINE 30 MG/ML IJ SOLN
INTRAMUSCULAR | Status: AC
  Filled 2024-05-18: qty 1

## 2024-05-18 MED ORDER — ROCURONIUM BROMIDE 10 MG/ML (PF) SYRINGE
PREFILLED_SYRINGE | INTRAVENOUS | Status: AC
  Filled 2024-05-18: qty 10

## 2024-05-18 MED ORDER — OXYCODONE HCL 5 MG PO TABS
ORAL_TABLET | ORAL | Status: AC
  Filled 2024-05-18: qty 1

## 2024-05-18 MED ORDER — OXYCODONE HCL 5 MG/5ML PO SOLN: 5.0000 mg | Freq: Once | ORAL | Status: AC | PRN

## 2024-05-18 MED ORDER — SODIUM CHLORIDE 0.9 % IV SOLN
INTRAVENOUS | Status: DC | PRN
  Administered 2024-05-18: 1000 mL

## 2024-05-18 MED ORDER — METRONIDAZOLE 500 MG/100ML IV SOLN
INTRAVENOUS | Status: AC
  Filled 2024-05-18: qty 100

## 2024-05-18 MED ORDER — METRONIDAZOLE 500 MG/100ML IV SOLN
500.0000 mg | INTRAVENOUS | Status: AC
  Administered 2024-05-18: 500 mg via INTRAVENOUS

## 2024-05-18 MED ORDER — ACETAMINOPHEN 500 MG PO TABS
1000.0000 mg | ORAL_TABLET | ORAL | Status: AC
  Administered 2024-05-18: 1000 mg via ORAL

## 2024-05-18 MED ORDER — ROCURONIUM BROMIDE 10 MG/ML (PF) SYRINGE
PREFILLED_SYRINGE | INTRAVENOUS | Status: DC | PRN
  Administered 2024-05-18: 50 mg via INTRAVENOUS

## 2024-05-18 MED ORDER — ACETAMINOPHEN 10 MG/ML IV SOLN: 1000.0000 mg | Freq: Once | INTRAVENOUS | Status: DC | PRN

## 2024-05-18 MED ORDER — BUPIVACAINE HCL (PF) 0.25 % IJ SOLN
INTRAMUSCULAR | Status: AC
Start: 2024-05-18 — End: ?
  Filled 2024-05-18: qty 30

## 2024-05-18 MED ORDER — SODIUM CHLORIDE 0.9 % IV SOLN
2.0000 g | INTRAVENOUS | Status: AC
  Administered 2024-05-18: 2 g via INTRAVENOUS

## 2024-05-18 MED ORDER — SCOPOLAMINE 1 MG/3DAYS TD PT72
1.0000 | MEDICATED_PATCH | TRANSDERMAL | Status: DC
  Administered 2024-05-18: 1.5 mg via TRANSDERMAL

## 2024-05-18 MED ORDER — CHLORHEXIDINE GLUCONATE 0.12 % MT SOLN
15.0000 mL | Freq: Once | OROMUCOSAL | Status: AC
  Administered 2024-05-18: 15 mL via OROMUCOSAL

## 2024-05-18 MED ORDER — KETOROLAC TROMETHAMINE 30 MG/ML IJ SOLN
INTRAMUSCULAR | Status: DC | PRN
  Administered 2024-05-18: 30 mg via INTRAVENOUS

## 2024-05-18 MED ORDER — BUPIVACAINE HCL 0.25 % IJ SOLN
INTRAMUSCULAR | Status: DC | PRN
  Administered 2024-05-18: 13 mL

## 2024-05-18 MED ORDER — ONDANSETRON HCL 4 MG/2ML IJ SOLN
INTRAMUSCULAR | Status: DC | PRN
  Administered 2024-05-18: 4 mg via INTRAVENOUS

## 2024-05-18 MED ORDER — ONDANSETRON HCL 4 MG/2ML IJ SOLN
INTRAMUSCULAR | Status: AC
Start: 2024-05-18 — End: ?
  Filled 2024-05-18: qty 2

## 2024-05-18 MED ORDER — PROPOFOL 10 MG/ML IV BOLUS
INTRAVENOUS | Status: DC | PRN
  Administered 2024-05-18 (×2): 10 mg via INTRAVENOUS
  Administered 2024-05-18: 130 mg via INTRAVENOUS

## 2024-05-18 SURGICAL SUPPLY — 61 items
APPLICATOR ARISTA FLEXITIP XL (MISCELLANEOUS) IMPLANT
BARRIER ADHS 3X4 INTERCEED (GAUZE/BANDAGES/DRESSINGS) IMPLANT
CATH FOLEY 3WAY 5CC 16FR (CATHETERS) ×3 IMPLANT
COVER BACK TABLE 60X90IN (DRAPES) ×3 IMPLANT
COVER MAYO STAND STRL (DRAPES) ×3 IMPLANT
COVER TIP SHEARS 8 DVNC (MISCELLANEOUS) ×3 IMPLANT
DEFOGGER SCOPE WARM SEASHARP (MISCELLANEOUS) ×3 IMPLANT
DERMABOND ADVANCED .7 DNX12 (GAUZE/BANDAGES/DRESSINGS) ×3 IMPLANT
DRAPE ARM DVNC X/XI (DISPOSABLE) ×12 IMPLANT
DRAPE COLUMN DVNC XI (DISPOSABLE) ×3 IMPLANT
DRAPE SURG IRRIG POUCH 19X23 (DRAPES) ×3 IMPLANT
DRAPE UTILITY XL STRL (DRAPES) ×3 IMPLANT
DRIVER NDL MEGA SUTCUT DVNCXI (INSTRUMENTS) ×2 IMPLANT
DRIVER NDLE MEGA SUTCUT DVNCXI (INSTRUMENTS) ×3 IMPLANT
DURAPREP 26ML APPLICATOR (WOUND CARE) ×3 IMPLANT
ELECTRODE REM PT RTRN 9FT ADLT (ELECTROSURGICAL) ×3 IMPLANT
FORCEPS PROGRASP DVNC XI (FORCEP) ×3 IMPLANT
GAUZE 4X4 16PLY ~~LOC~~+RFID DBL (SPONGE) IMPLANT
GLOVE BIOGEL PI IND STRL 7.0 (GLOVE) ×6 IMPLANT
GLOVE NEODERM STER SZ 7 (GLOVE) ×9 IMPLANT
GOWN STRL REUS W/TWL LRG LVL3 (GOWN DISPOSABLE) ×3 IMPLANT
HEMOSTAT ARISTA ABSORB 3G PWDR (HEMOSTASIS) IMPLANT
HIBICLENS CHG 4% 4OZ BTL (MISCELLANEOUS) ×6 IMPLANT
HOLDER FOLEY CATH W/STRAP (MISCELLANEOUS) IMPLANT
IRRIGATION SUCT STRKRFLW 2 WTP (MISCELLANEOUS) ×3 IMPLANT
KIT PINK PAD W/HEAD ARE REST (MISCELLANEOUS) ×3 IMPLANT
KIT PINK PAD W/HEAD ARM REST (MISCELLANEOUS) ×3 IMPLANT
KIT TURNOVER CYSTO (KITS) ×3 IMPLANT
KIT TURNOVER KIT B (KITS) ×3 IMPLANT
LEGGING LITHOTOMY PAIR STRL (DRAPES) ×3 IMPLANT
MANIFOLD NEPTUNE II (INSTRUMENTS) ×3 IMPLANT
NS IRRIG 1000ML POUR BTL (IV SOLUTION) ×3 IMPLANT
OBTURATOR OPTICALSTD 8 DVNC (TROCAR) ×3 IMPLANT
OCCLUDER COLPOPNEUMO (BALLOONS) IMPLANT
PACK CYSTO (CUSTOM PROCEDURE TRAY) ×3 IMPLANT
PACK ROBOT WH (CUSTOM PROCEDURE TRAY) ×3 IMPLANT
PACK ROBOTIC GOWN (GOWN DISPOSABLE) ×3 IMPLANT
PAD OB MATERNITY 11 LF (PERSONAL CARE ITEMS) ×3 IMPLANT
PAD OB MATERNITY 4.3X12.25 (PERSONAL CARE ITEMS) ×3 IMPLANT
RUMI II 3.0CM BLUE KOH-EFFICIE (DISPOSABLE) ×1 IMPLANT
RUMI II GYRUS 2.5CM BLUE (DISPOSABLE) IMPLANT
RUMI II GYRUS 3.5CM BLUE (DISPOSABLE) IMPLANT
RUMI II GYRUS 4.0CM BLUE (DISPOSABLE) IMPLANT
SCISSORS MNPLR CVD DVNC XI (INSTRUMENTS) ×3 IMPLANT
SCOPETTES 8 STERILE (MISCELLANEOUS) IMPLANT
SEAL UNIV 5-12 XI (MISCELLANEOUS) ×9 IMPLANT
SEALER VESSEL EXT DVNC XI (MISCELLANEOUS) ×1 IMPLANT
SET CYSTO W/LG BORE CLAMP LF (SET/KITS/TRAYS/PACK) ×3 IMPLANT
SET TUBE SMOKE EVAC HIGH FLOW (TUBING) ×3 IMPLANT
SLEEVE SCD COMPRESS KNEE MED (STOCKING) ×3 IMPLANT
SOL PREP POV-IOD 4OZ 10% (MISCELLANEOUS) ×3 IMPLANT
SPIKE FLUID TRANSFER (MISCELLANEOUS) ×3 IMPLANT
SUT MNCRL AB 4-0 PS2 18 (SUTURE) ×3 IMPLANT
SUT VLOC 180 0 6IN GS21 (SUTURE) IMPLANT
SUT VLOC 180 0 9IN GS21 (SUTURE) ×6 IMPLANT
TIP UTERINE 6.7X10CM GRN DISP (MISCELLANEOUS) IMPLANT
TIP UTERINE 6.7X8CM BLUE DISP (MISCELLANEOUS) ×1 IMPLANT
TOWEL GREEN STERILE (TOWEL DISPOSABLE) ×3 IMPLANT
TRAY FOLEY W/BAG SLVR 14FR (SET/KITS/TRAYS/PACK) ×3 IMPLANT
UNDERPAD 30X36 HEAVY ABSORB (UNDERPADS AND DIAPERS) ×3 IMPLANT
WATER STERILE IRR 500ML POUR (IV SOLUTION) ×3 IMPLANT

## 2024-05-18 NOTE — Discharge Instructions (Signed)
     No acetaminophen/Tylenol until after 1:30 pm today if needed.  No ibuprofen , Advil , Aleve, Motrin , ketorolac, meloxicam, naproxen, or other NSAIDS until after 3:30 pm today if needed.    Post Anesthesia Home Care Instructions  Activity: Get plenty of rest for the remainder of the day. A responsible individual must stay with you for 24 hours following the procedure.  For the next 24 hours, DO NOT: -Drive a car -Advertising copywriter -Drink alcoholic beverages -Take any medication unless instructed by your physician -Make any legal decisions or sign important papers.  Meals: Start with liquid foods such as gelatin or soup. Progress to regular foods as tolerated. Avoid greasy, spicy, heavy foods. If nausea and/or vomiting occur, drink only clear liquids until the nausea and/or vomiting subsides. Call your physician if vomiting continues.  Special Instructions/Symptoms: Your throat may feel dry or sore from the anesthesia or the breathing tube placed in your throat during surgery. If this causes discomfort, gargle with warm salt water. The discomfort should disappear within 24 hours.  If you had a scopolamine patch placed behind your ear for the management of post- operative nausea and/or vomiting:  1. The medication in the patch is effective for 72 hours, after which it should be removed.  Wrap patch in a tissue and discard in the trash. Wash hands thoroughly with soap and water. 2. You may remove the patch earlier than 72 hours if you experience unpleasant side effects which may include dry mouth, dizziness or visual disturbances. 3. Avoid touching the patch. Wash your hands with soap and water after contact with the patch.

## 2024-05-18 NOTE — Anesthesia Procedure Notes (Signed)
 Procedure Name: Intubation Date/Time: 05/18/2024 8:33 AM  Performed by: Gabe Jock, CRNAPre-anesthesia Checklist: Patient identified, Emergency Drugs available, Suction available and Patient being monitored Patient Re-evaluated:Patient Re-evaluated prior to induction Oxygen Delivery Method: Circle System Utilized Preoxygenation: Pre-oxygenation with 100% oxygen Induction Type: IV induction Ventilation: Mask ventilation without difficulty Laryngoscope Size: Mac and 4 Grade View: Grade I Tube type: Oral Tube size: 7.0 mm Number of attempts: 1 Airway Equipment and Method: Stylet Placement Confirmation: ETT inserted through vocal cords under direct vision, positive ETCO2 and breath sounds checked- equal and bilateral Secured at: 22 cm Tube secured with: Tape Dental Injury: Teeth and Oropharynx as per pre-operative assessment

## 2024-05-18 NOTE — Op Note (Signed)
 05/18/2024  [id number] Beth [name]        OPERATIVE REPORT   Preop Diagnosis: menorrhagia, dysmenorrhea, anemia Procedure: robotic hysterectomy, bilateral salpingectomy, cystoscopy   Surgeon: Dr. Arvell Birchwood [name] Assistant: Bernadette Brewster, RN   Fluids: please see anesthesia report   Complications: None Anesthesia: General     Findings:  boggy contour 8cm uterus, normal ovaries and tubes. No ovarian cysts or evidence of endometriosis, normal appendix. Cystoscopy at the end of the case with normal bladder and patent ureters bilaterally.   Estimated blood loss: Minimal 50cc   Specimens: Uterus, cervix and bilateral tubes   Disposition of specimen: Pathology           Patient is taken to the operating room. She is placed in the supine position. She is a running IV in place. Informed consent was present on the chart. SCDs on her lower extremities and functioning properly. Patient was positioned while she was awake.  Her legs were placed in the low lithotomy position in Bock stirrups. Her arms were tucked by the side.  General endotracheal anesthesia was administered by the anesthesia staff without difficulty.       Dura prep was then used to prep the abdomen and Hibiclens was used to prep the inner thighs, perineum and vagina. Once 3 minutes had past the patient was draped in a normal standard fashion. A proper time out was performed and everyone agreed.  The legs were lifted to the high lithotomy position. A bivalve speculum was inserted into the vagina and the anterior lip of the cervix was grasped with single-tooth tenaculum.  The uterus sounded to 8 cm. Pratt dilators were used to dilate the cervix.  The RUMI uterine manipulator was obtained inserted into the endometrial cavity and the bulb of the disposable tip was inflated with 8 cc of normal saline. There was a good fit of the KOH ring around the cervix. The tenaculum and bivavle speculum was removed. There is also  good manipulation of the uterus.  A Foley catheter was placed to straight drain.  Clear urine was noted. Legs were lowered to the low lithotomy position and attention was turned the abdomen.   Superior to the umbilicus, marcaine 0.25% used to anesthetize the skin.  Using #11 blade, 8mm skin incision was made.  The 8mm robotic trocar and sleeve was inserted under direct visualization.  CO2 gas was  started and patient was placed in trendelenburg position.  Two additional 8mm ports were placed under direct visualization in the left and right lower quadrant.     Ureters were identifies.  Attention was turned to the left side. The left tube was elevated and the mesosalpinx was desiccated with the vessel sealer.  The left uterine ovarian pedicle was serially clamped cauterized and incised. Left round ligament was serially clamped cauterized and incised. The anterior and posterior peritoneum of the inferior leaf of the broad ligament were opened. The beginning of the bladder flap was created.  The bladder was taken down below the level of the KOH ring. The left uterine artery skeletonized and then just superior to the KOH ring this vessel was serially clamped, cauterized, and incised.   Attention was turned the right side.  The uterus was placed on stretch to the opposite side.    The mesosalpinx was incised freeing the tube. Then the right uterine ovarian pedicle was serially clamped cauterized and incised. Next the right round ligament was serially clamped cauterized and incised. The anterior posterior peritoneum  of the inferiorly for the broad ligament were opened. The anterior peritoneum was carried across to the dissection on the left side. The remainder of the bladder flap was created using sharp dissection. The bladder was well below the level of the KOH ring. The right uterine artery skeletonized. Then the right uterine artery, above the level of the KOH ring, was serially clamped cauterized and incised. The  uterus was devascularized at this point.   The colpotomy was performed.  This was carried around a circumferential fashion until the vaginal mucosa was completely incised in the specimen was freed.  The specimen was then delivered to the vagina intact.  A vaginal occlusive device was used to maintain the pneumoperitoneum   Instruments were changed with a needle driver and prograsp.  Using a 9 inch  zero V-lock suture, the cuff was closed by incorporating the anterior and posterior vaginal mucosa in each stitch. This was carried across all the way to the left corner and a running fashion. Two stitches were brought back towards the midline and the suture was cut flush with the vagina. The needle was brought out the pelvis. The pelvis was irrigated. All pedicles were inspected. No bleeding was noted.   Co2 pressures were lowered to 8mm Hg.  Again, no bleeding was noted.  Ureters were noted deep in the pelvis to be peristalsing.  At this point the procedure was completed.  The remaining instruments were removed.  The ports were removed under direct visualization of the laparoscope and the pneumoperitoneum was relieved.   The skin was then closed with subcuticular stitches of 3-0 Vicryl. The skin was cleansed Dermabond was applied. Attention was then turned the vagina and the cuff was inspected. No bleeding was noted.  The Foley catheter was removed.  Cystoscopy was performed.  No sutures or bladder injuries were noted.  Ureters were noted with normal urine jets from each one was seen.  Foley was left out after the cystoscopic fluid was drained and cystoscope removed.  Sponge, lap, needle, instrument counts were correct x2. Patient tolerated the procedure very well. She was awakened from anesthesia, extubated and taken to recovery in stable condition.      Dr. Tia Flowers

## 2024-05-18 NOTE — Anesthesia Postprocedure Evaluation (Signed)
 Anesthesia Post Note  Patient: Beth [name]  Procedure(s) Performed: ROBOTIC ASSISTED TOTAL HYSTERECTOMY WITH SALPINGECTOMY (Bilateral: Abdomen) CYSTOSCOPY (Bladder)     Patient location during evaluation: PACU Anesthesia Type: General Level of consciousness: awake and alert Pain management: pain level controlled Vital Signs Assessment: post-procedure vital signs reviewed and stable Respiratory status: spontaneous breathing, nonlabored ventilation, respiratory function stable and patient connected to nasal cannula oxygen Cardiovascular status: blood pressure returned to baseline and stable Postop Assessment: no apparent nausea or vomiting Anesthetic complications: no  No notable events documented.  Last Vitals:  Vitals:   05/18/24 1045 05/18/24 1100  BP: (!) 93/53 121/72  Pulse: 71 69  Resp: (!) 21 16  Temp:    SpO2: 95% 97%    Last Pain:  Vitals:   05/18/24 1128  TempSrc:   PainSc: 3                  Willian Harrow

## 2024-05-18 NOTE — Anesthesia Preprocedure Evaluation (Addendum)
 Anesthesia Evaluation  Patient identified by MRN, date of birth, ID band Patient awake    Reviewed: Allergy & Precautions, NPO status , Patient's Chart, lab work & pertinent test results  Airway Mallampati: I  TM Distance: >3 FB Neck ROM: Full    Dental  (+) Teeth Intact, Dental Advisory Given   Pulmonary asthma , COPD,  COPD inhaler, former smoker   breath sounds clear to auscultation       Cardiovascular negative cardio ROS  Rhythm:Regular Rate:Normal     Neuro/Psych  PSYCHIATRIC DISORDERS Anxiety Depression Bipolar Disorder   negative neurological ROS     GI/Hepatic Neg liver ROS,GERD  ,,  Endo/Other  negative endocrine ROS    Renal/GU negative Renal ROS     Musculoskeletal negative musculoskeletal ROS (+)    Abdominal   Peds  Hematology negative hematology ROS (+)   Anesthesia Other Findings   Reproductive/Obstetrics                             Anesthesia Physical Anesthesia Plan  ASA: 3  Anesthesia Plan: General   Post-op Pain Management: Tylenol PO (pre-op)*   Induction: Intravenous  PONV Risk Score and Plan: 4 or greater and Ondansetron, Dexamethasone, Midazolam and Scopolamine patch - Pre-op  Airway Management Planned: Oral ETT  Additional Equipment: None  Intra-op Plan:   Post-operative Plan: Extubation in OR  Informed Consent: I have reviewed the patients History and Physical, chart, labs and discussed the procedure including the risks, benefits and alternatives for the proposed anesthesia with the patient or authorized representative who has indicated his/her understanding and acceptance.     Dental advisory given  Plan Discussed with: CRNA  Anesthesia Plan Comments: (- 2 IV's)       Anesthesia Quick Evaluation

## 2024-05-18 NOTE — Interval H&P Note (Signed)
 H&P with no changes Patient seen and examined Procedure confirmed All questions answered Blood pressure 127/79, pulse 72, temperature 98 F (36.7 C), temperature source Oral, resp. rate 14, height 5\' 7"  (1.702 m), weight 77.1 kg, last menstrual period 05/11/2024, SpO2 97%.  CBC    Component Value Date/Time   WBC 6.1 05/16/2024 1000   RBC 4.71 05/16/2024 1000   HGB 14.7 05/16/2024 1000   HGB 14.5 04/21/2022 1049   HCT 43.2 05/16/2024 1000   HCT 42.6 04/21/2022 1049   PLT 338 05/16/2024 1000   PLT 338 04/21/2022 1049   MCV 91.7 05/16/2024 1000   MCV 92 04/21/2022 1049   MCH 31.2 05/16/2024 1000   MCHC 34.0 05/16/2024 1000   RDW 12.1 05/16/2024 1000   RDW 12.2 04/21/2022 1049   LYMPHSABS 1,995 09/18/2021 1125   MONOABS 570 03/20/2017 1100   EOSABS 251 09/18/2021 1125   BASOSABS 91 09/18/2021 1125    Dr. Tia Flowers

## 2024-05-18 NOTE — Transfer of Care (Signed)
 Immediate Anesthesia Transfer of Care Note  Patient: Beth [name]  Procedure(s) Performed: ROBOTIC ASSISTED TOTAL HYSTERECTOMY WITH SALPINGECTOMY (Bilateral: Abdomen) CYSTOSCOPY (Bladder)  Patient Location: PACU  Anesthesia Type:General  Level of Consciousness: awake and alert   Airway & Oxygen Therapy: Patient Spontanous Breathing and Patient connected to nasal cannula oxygen  Post-op Assessment: Report given to RN and Post -op Vital signs reviewed and stable  Post vital signs: Reviewed and stable  Last Vitals:  Vitals Value Taken Time  BP 101/52 05/18/24 0957  Temp    Pulse 78 05/18/24 1000  Resp 17 05/18/24 1000  SpO2 97 % 05/18/24 1000  Vitals shown include unfiled device data.  Last Pain:  Vitals:   05/18/24 0726  TempSrc:   PainSc: 0-No pain      Patients Stated Pain Goal: 4 (05/18/24 7829)  Complications: No notable events documented.

## 2024-05-19 ENCOUNTER — Encounter (HOSPITAL_COMMUNITY): Payer: Self-pay | Admitting: Obstetrics and Gynecology

## 2024-05-19 ENCOUNTER — Ambulatory Visit: Payer: Self-pay | Admitting: Obstetrics and Gynecology

## 2024-05-19 LAB — SURGICAL PATHOLOGY

## 2024-05-19 MED ORDER — OXYCODONE HCL 5 MG PO TABS: 5.0000 mg | ORAL_TABLET | ORAL | 0 refills | Status: DC | PRN

## 2024-05-23 ENCOUNTER — Other Ambulatory Visit: Payer: Self-pay | Admitting: Psychiatry

## 2024-05-23 ENCOUNTER — Encounter: Payer: Self-pay | Admitting: Obstetrics and Gynecology

## 2024-05-23 ENCOUNTER — Ambulatory Visit (INDEPENDENT_AMBULATORY_CARE_PROVIDER_SITE_OTHER): Admitting: Obstetrics and Gynecology

## 2024-05-23 VITALS — BP 108/70 | HR 92 | Temp 98.2°F

## 2024-05-23 DIAGNOSIS — R102 Pelvic and perineal pain: Secondary | ICD-10-CM

## 2024-05-23 DIAGNOSIS — Z9889 Other specified postprocedural states: Secondary | ICD-10-CM

## 2024-05-23 DIAGNOSIS — R1013 Epigastric pain: Secondary | ICD-10-CM

## 2024-05-23 DIAGNOSIS — K567 Ileus, unspecified: Secondary | ICD-10-CM

## 2024-05-23 LAB — URINALYSIS, COMPLETE W/RFL CULTURE
Bacteria, UA: NONE SEEN /HPF
Bilirubin Urine: NEGATIVE
Glucose, UA: NEGATIVE
Hgb urine dipstick: NEGATIVE
Hyaline Cast: NONE SEEN /LPF
Ketones, ur: NEGATIVE
Leukocyte Esterase: NEGATIVE
Nitrites, Initial: NEGATIVE
Protein, ur: NEGATIVE
RBC / HPF: NONE SEEN /HPF (ref 0–2)
Specific Gravity, Urine: 1.01 (ref 1.001–1.035)
WBC, UA: NONE SEEN /HPF (ref 0–5)
pH: 6 (ref 5.0–8.0)

## 2024-05-23 LAB — NO CULTURE INDICATED

## 2024-05-23 MED ORDER — FLEET ENEMA RE ENEM: 1.0000 | ENEMA | Freq: Every day | RECTAL | 0 refills | Status: DC | PRN

## 2024-05-23 MED ORDER — DOCUSATE SODIUM 100 MG PO CAPS: 100.0000 mg | ORAL_CAPSULE | Freq: Two times a day (BID) | ORAL | 0 refills | Status: AC

## 2024-05-23 MED ORDER — OXYCODONE HCL 5 MG PO TABS: 5.0000 mg | ORAL_TABLET | ORAL | 0 refills | Status: DC | PRN

## 2024-05-23 MED ORDER — POLYETHYLENE GLYCOL 3350 17 G PO PACK: 17.0000 g | PACK | Freq: Every day | ORAL | 0 refills | Status: AC

## 2024-05-23 NOTE — Telephone Encounter (Signed)
 Spoke with patient. S/p [facility] BS on 05/18/24.   Patient reports pain in lower abdomen 8 out of 10 on pain scale. Describes as menses like cramps, hurts to move around. Taking ibuprofen  and tylenol , using a heating pad, little relief.   Abdomen is soft.   One episode of N/V last night, this has resolved.   Eating when taking medication. Trying to eat soup at night.   Passing very little gas. BM 05/21/24.   No bleeding.   Denies fever/chills.  Voiding, does not feel like she is emptying completely due to discomfort.    States oxycodone  was helping with pain, no RX remaining.   Advised I will review with Dr. Tia Flowers and f/u. Patient agreeable.   Dr. Tia Flowers -please review and advise.

## 2024-05-23 NOTE — Telephone Encounter (Signed)
 Spoke with patient. Patient will come on into office now to be worked into schedule.   Routing to provider for final review. Patient is agreeable to disposition. Will close encounter.

## 2024-05-23 NOTE — Progress Notes (Unsigned)
   Acute Office Visit  Subjective:    Patient ID: Beth [name], female    DOB: 1976/04/11, 48 y.o.   MRN: [id number]   HPI 48 y.o. presents today for Post-op Problem (Encounter 05/23/2024:/"S/p RLH BS on 05/18/24. Patient reports pain in lower abdomen 8 out of 10 on pain scale. Describes as menses like cramps, hurts to move around. Taking ibuprofen  and tylenol , using a heating pad, little relief. / Abdomen is soft. / One episode of N/V last night, this has resolved. / Eating when taking medication. Trying to eat soup at night. / Passing very little gas. BM 05/21/24. / No bleeding. / Denies fever/chills.) and Post Op Problem Additional.  ( Voiding, does not feel like she is emptying completely due to discomfort.  /States oxycodone  was helping with pain, no Rx remaining.") .  Patient's last menstrual period was 05/11/2024 (exact date).    Blood pressure 108/70, pulse 92, temperature 98.2 F (36.8 C), last menstrual period 05/11/2024, SpO2 94%.   Review of Systems     Objective:     OBGyn Exam  BP 108/70   Pulse 92   Temp 98.2 F (36.8 C)   LMP 05/11/2024 (Exact Date)   SpO2 94%  Wt Readings from Last 3 Encounters:  05/18/24 170 lb (77.1 kg)  04/21/24 181 lb (82.1 kg)  02/15/24 188 lb (85.3 kg)        Patient informed chaperone available to be present for breast and/or pelvic exam. Patient has requested no chaperone to be present. Patient has been advised what will be completed during breast and pelvic exam.   Assessment & Plan:  ***  Reinaldo Caras

## 2024-05-24 ENCOUNTER — Encounter: Payer: Self-pay | Admitting: *Deleted

## 2024-05-30 ENCOUNTER — Other Ambulatory Visit

## 2024-05-30 ENCOUNTER — Ambulatory Visit: Payer: Self-pay | Admitting: Obstetrics and Gynecology

## 2024-05-30 ENCOUNTER — Encounter: Payer: Self-pay | Admitting: Obstetrics and Gynecology

## 2024-05-30 DIAGNOSIS — R339 Retention of urine, unspecified: Secondary | ICD-10-CM

## 2024-05-30 LAB — URINALYSIS, COMPLETE W/RFL CULTURE
Bacteria, UA: NONE SEEN /HPF
Bilirubin Urine: NEGATIVE
Glucose, UA: NEGATIVE
Hgb urine dipstick: NEGATIVE
Hyaline Cast: NONE SEEN /LPF
Ketones, ur: NEGATIVE
Leukocyte Esterase: NEGATIVE
Nitrites, Initial: NEGATIVE
Protein, ur: NEGATIVE
RBC / HPF: NONE SEEN /HPF (ref 0–2)
Specific Gravity, Urine: 1.02 (ref 1.001–1.035)
WBC, UA: NONE SEEN /HPF (ref 0–5)
pH: 6.5 (ref 5.0–8.0)

## 2024-05-30 LAB — NO CULTURE INDICATED

## 2024-05-30 NOTE — Telephone Encounter (Signed)
 05/23/24 UC negative  Routing to Dr. Tia Flowers to review and advise.

## 2024-05-30 NOTE — Telephone Encounter (Signed)
 Spoke with patient.  Lab appt scheduled for today at 1450.  Patient aware she will be called with results.   Routing to provider for final review. Patient is agreeable to disposition. Will close encounter.

## 2024-05-31 ENCOUNTER — Ambulatory Visit: Admitting: Internal Medicine

## 2024-05-31 MED ORDER — NITROFURANTOIN MONOHYD MACRO 100 MG PO CAPS: 100.0000 mg | ORAL_CAPSULE | Freq: Two times a day (BID) | ORAL | 0 refills | Status: DC

## 2024-06-01 ENCOUNTER — Encounter: Payer: Self-pay | Admitting: Obstetrics and Gynecology

## 2024-06-01 ENCOUNTER — Ambulatory Visit (INDEPENDENT_AMBULATORY_CARE_PROVIDER_SITE_OTHER): Admitting: Obstetrics and Gynecology

## 2024-06-01 VITALS — BP 112/70 | HR 95 | Wt 172.8 lb

## 2024-06-01 DIAGNOSIS — T148XXA Other injury of unspecified body region, initial encounter: Secondary | ICD-10-CM

## 2024-06-01 DIAGNOSIS — Z09 Encounter for follow-up examination after completed treatment for conditions other than malignant neoplasm: Secondary | ICD-10-CM

## 2024-06-01 DIAGNOSIS — E2839 Other primary ovarian failure: Secondary | ICD-10-CM

## 2024-06-01 MED ORDER — ESTRADIOL 0.1 MG/GM VA CREA
TOPICAL_CREAM | VAGINAL | 12 refills | Status: DC
Start: 2024-06-01 — End: 2024-06-02

## 2024-06-01 NOTE — Progress Notes (Signed)
 Patient presents for 2 week postop from [facility], bilateral salpingectomy, cystoscopy. She is doing well. No fevers, VB, dysuria or severe abdominal pain. BM have improved.  No longer taking oxycodone . Difficult voids.  Attempts to go and nothing comes and then returns several times and goes. No upper back pain.  Has not gone to pick up the macrobid . Reviewed allergies with her.  BP 112/70 (BP Location: Left Arm, Patient Position: Sitting, Cuff Size: Normal)   Pulse 95   Wt 172 lb 12.8 oz (78.4 kg)   LMP 05/11/2024 (Exact Date)   SpO2 98%   BMI 27.06 kg/m   Abdomen: incisions I/c/d, NT, ND  A/p PO from [facility] 2 weeks doing well Encouraged no heavy lifting, pushing, pulling greater than 13 lbs for full 6 weeks 2. Pelvic rest for the entire 10 wks possible longer.  Will do exam at 10 wks and determine time frame from there to resume 3. RTC with any concerns or with heavy bleeding, fevers or severe abdominal pain. 4.  H/o ankle fracture, no family history of osteo or fractures.  To get baseline. Encouraged calcium, vit D and weight bearing exercises 5. To pick up macrobid  and estrace . Intrarosa  costly for her. Discussed using estrace  night for 3 weeks and then 3 times a week and intrarosa  2 times a week or just resuming estrace  three times a week.  Dr. Tia Flowers

## 2024-06-02 MED ORDER — ESTRADIOL 0.1 MG/GM VA CREA: TOPICAL_CREAM | VAGINAL | 12 refills | Status: AC

## 2024-06-04 ENCOUNTER — Other Ambulatory Visit: Payer: Self-pay | Admitting: Psychiatry

## 2024-06-04 DIAGNOSIS — F5105 Insomnia due to other mental disorder: Secondary | ICD-10-CM

## 2024-06-06 NOTE — Telephone Encounter (Signed)
 Spoke with patient. Patient reports menses like cramps 1 out of 10 on pain scale. Feels better today. Denies any other GYN symptoms, fever/chills, N/V. Abdomen is soft. Last BM 6/23, normal. Incisions closed, looks good per patient.  Not overdoing housework and activity.   Has 2 days of Macrobid  remaining. Has used vaginal estrogen once. Advised to complete Macrobid , if symptoms do not completely resolve or new symptoms develop, return call to office. Advised I will update Dr. Glennon and our office will return call if any additional recommendations.  Patient agreeable.

## 2024-06-08 NOTE — Telephone Encounter (Signed)
 Spoke with patient. Offered appt today at 1:45 or tomorrow at 10:15. Patient has to pick her husband up at 3pm, would prefer OV tomorrow. Scheduled for 6/26 at 1015 with Dr. Glennon.   Routing to provider for final review. Patient is agreeable to disposition. Will close encounter.

## 2024-06-09 ENCOUNTER — Encounter: Payer: Self-pay | Admitting: Obstetrics and Gynecology

## 2024-06-09 ENCOUNTER — Ambulatory Visit (INDEPENDENT_AMBULATORY_CARE_PROVIDER_SITE_OTHER): Admitting: Obstetrics and Gynecology

## 2024-06-09 VITALS — BP 110/70 | HR 81

## 2024-06-09 DIAGNOSIS — Z09 Encounter for follow-up examination after completed treatment for conditions other than malignant neoplasm: Secondary | ICD-10-CM

## 2024-06-12 NOTE — Progress Notes (Signed)
   Acute Office Visit  Subjective:    Patient ID: Beth [name], female    DOB: 08-12-1976, 48 y.o.   MRN: [id number]   HPI 48 y.o. presents today for Post-op Follow-up (Post op//jj remove stitch/[facility] Medicare High Risk (5 Partners, HSV+)/05-18-24 robotic hysterectomy with bilateral salpingectomy, cystoscopy//) .  Patient's last menstrual period was 05/11/2024 (exact date).    Review of Systems     Objective:    OBGyn Exam  BP 110/70   Pulse 81   LMP 05/11/2024 (Exact Date)   SpO2 97%  Wt Readings from Last 3 Encounters:  06/01/24 172 lb 12.8 oz (78.4 kg)  05/18/24 170 lb (77.1 kg)  04/21/24 181 lb (82.1 kg)        Abdomen stitch elevated and removed.  Incisions all intact, clean and dry No erythema  Assessment & Plan:  PO RLH stitch removed Patient is doing better. RTC with any concerns and with 10 wk PO visit for exam Beth [name]

## 2024-06-14 ENCOUNTER — Ambulatory Visit: Admitting: Psychiatry

## 2024-06-14 ENCOUNTER — Encounter: Payer: Self-pay | Admitting: Psychiatry

## 2024-06-14 DIAGNOSIS — F515 Nightmare disorder: Secondary | ICD-10-CM

## 2024-06-14 DIAGNOSIS — F411 Generalized anxiety disorder: Secondary | ICD-10-CM

## 2024-06-14 DIAGNOSIS — F4310 Post-traumatic stress disorder, unspecified: Secondary | ICD-10-CM

## 2024-06-14 DIAGNOSIS — F99 Mental disorder, not otherwise specified: Secondary | ICD-10-CM

## 2024-06-14 DIAGNOSIS — G2581 Restless legs syndrome: Secondary | ICD-10-CM

## 2024-06-14 DIAGNOSIS — F3110 Bipolar disorder, current episode manic without psychotic features, unspecified: Secondary | ICD-10-CM

## 2024-06-14 DIAGNOSIS — F5105 Insomnia due to other mental disorder: Secondary | ICD-10-CM | POA: Diagnosis not present

## 2024-06-14 DIAGNOSIS — F4312 Post-traumatic stress disorder, chronic: Secondary | ICD-10-CM

## 2024-06-14 MED ORDER — CLONIDINE HCL 0.1 MG PO TABS: 0.2000 mg | ORAL_TABLET | Freq: Two times a day (BID) | ORAL | 0 refills | Status: DC

## 2024-06-14 NOTE — Progress Notes (Addendum)
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  (VALTREX ) 500 MG tablet Take one tablet by mouth twice daily for 3-5 days then daily as needed. 90 tablet 1   azithromycin  (ZITHROMAX ) 250 MG tablet Take 2 on day one then 1 daily x 4 days 6 tablet 0   budesonide -glycopyrrolate-formoterol  (BREZTRI  AEROSPHERE) 160-9-4.8 MCG/ACT AERO inhaler Inhale 2 puffs into the lungs in the morning and at bedtime for 1 day.     cloNIDine  (CATAPRES ) 0.1 MG tablet Take 2 tablets (0.2 mg total) by mouth 2 (two) times daily. 360 tablet 0   Eszopiclone  3 MG TABS TAKE 1 TABLET BY MOUTH EVERY NIGHT IMMEDIATELY BEFORE BEDTIME 30 tablet 0   gabapentin  (NEURONTIN ) 400 MG capsule TAKE 1 CAPSULE TWICE DAILY AND TAKE 3 CAPSULES IN THE EVENING 450 capsule 0   LORazepam  (ATIVAN ) 1 MG tablet TAKE 1 TABLET BY MOUTH 2 TIMES A DAY AND TAKE 2 TABLETS BY MOUTH ONCE NIGHTLY 120 tablet 0   lurasidone  (LATUDA ) 80 MG TABS tablet TAKE 1 TABLET TWICE DAILY 180 tablet 0   predniSONE  (DELTASONE ) 10 MG tablet Take  4 each am x 2 days,   2 each am x 2 days,  1 each am x 2 days and stop 14 tablet 0   No current facility-administered medications for this visit.    Medication Side Effects: nausea  Allergies: No Known Allergies  Past Medical History:  Diagnosis Date   Bipolar disorder with moderate depression (HCC)    psychiastry--- dr c. cottle   COPD (chronic  obstructive pulmonary disease) (HCC)    (05-11-2024  pt denies sob w/ any actiivies )  pulmonary--- dr wert;   COPD likely GOLD 2/ AB, mild;  dyspnea w/ household chores   Cough variant asthma  Fracture    h/o ankle fracture   GAD (generalized anxiety disorder)    GERD (gastroesophageal reflux disease)    History of adenomatous polyp of colon    HSV (herpes simplex virus) anogenital infection 05/2018   Insomnia    Nightmares associated with chronic post-traumatic stress disorder    PTSD (post-traumatic stress disorder)    RLS (restless legs syndrome)    Seasonal allergic rhinitis    Wears glasses     Family History  Problem Relation Age of Onset   Thyroid  disease Mother    COPD Mother    Bipolar disorder Mother    Anxiety disorder Mother    Depression Mother    Parkinson's disease Father    Bipolar disorder Sister    Breast cancer Paternal Grandmother 5   Schizophrenia Other    Colon cancer Neg Hx    Colon polyps Neg Hx    Esophageal cancer Neg Hx    Rectal cancer Neg Hx    Stomach cancer Neg Hx     Social History   Socioeconomic History   Marital status: Married    Spouse name: [name],[name]   Number of children: 0   Years of education: 16   Highest education level: Associate degree: academic program  Occupational History   Occupation: unemployed   Occupation: disabled  Tobacco Use   Smoking status: Former    Types: Cigarettes   Smokeless tobacco: Never   Tobacco comments:    05-11-2024  pt stated quit smoking 2023,  started age 46  (40)  for 6 yrs  Vaping Use   Vaping status: Never Used  Substance and Sexual Activity   Alcohol use: Not Currently   Drug use: Never   Sexual activity: Not Currently    Partners: Male    Birth control/protection: Surgical    Comment: 1st intercourse 59 yo-5 partners, hysterectomy  Other Topics Concern   Not on file  Social History Narrative   Pt is right handed   Lives in single story home with her husband, mother  and uncle   Has associated degree   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    Social Drivers of Corporate investment banker Strain: Low Risk  (05/10/2024)   Overall Financial Resource Strain (CARDIA)    Difficulty of Paying Living Expenses: Not hard at all  Food Insecurity: No Food Insecurity (05/10/2024)   Hunger Vital Sign    Worried About Running Out of Food in the Last Year: Never true    Ran Out of Food in the Last Year: Never true  Transportation Needs: No Transportation Needs (05/10/2024)   PRAPARE - Administrator, Civil Service (Medical): No    Lack of Transportation (Non-Medical): No  Physical Activity: Unknown (05/10/2024)   Exercise Vital Sign    Days of Exercise per Week: 0 days    Minutes of Exercise per Session: Not on file  Stress: Stress Concern Present (05/10/2024)   [employer] of Occupational Health - Occupational Stress Questionnaire    Feeling of Stress : Rather much  Social Connections: Moderately Isolated (05/10/2024)   Social Connection and Isolation Panel    Frequency of Communication with Friends and Family: Twice a week    Frequency of Social Gatherings with Friends and Family: Never    Attends Religious Services: 1 to 4 times per year    Active Member of [employer] or Organizations: No    Attends Banker Meetings: Not  on file    Marital Status: Married  Catering manager Violence: Not At Risk (03/09/2023)   Humiliation, Afraid, Rape, and Kick questionnaire    Fear of Current or Ex-Partner: No    Emotionally Abused: No    Physically Abused: No    Sexually Abused: No    Past Medical History, Surgical history, Social history, and Family history were reviewed and updated as appropriate.   Please see review of systems for further details on the patient's review from today.   Objective:   Physical Exam:  LMP 05/11/2024 (Exact Date)   Physical Exam Constitutional:      General: She is not in acute  distress. Musculoskeletal:        General: No deformity.  Neurological:     Mental Status: She is alert and oriented to person, place, and time.     Coordination: Coordination normal.  Psychiatric:        Attention and Perception: Attention and perception normal. She does not perceive auditory or visual hallucinations.        Mood and Affect: Mood is anxious and depressed. Mood is not elated. Affect is not labile, angry, tearful or inappropriate.        Speech: Speech normal. Speech is not rapid and pressured.        Behavior: Behavior normal. Behavior is not agitated, hyperactive or combative.        Thought Content: Thought content is not paranoid or delusional. Thought content does not include homicidal or suicidal ideation. Thought content does not include suicidal plan.        Cognition and Memory: Cognition and memory normal.        Judgment: Judgment normal.     Comments: Insight intact No AIM  manic tension and irritablity for a couple of mos      Lab Review:     Component Value Date/Time   NA 134 (L) 05/16/2024 1000   NA 137 04/21/2022 1049   K 4.0 05/16/2024 1000   CL 102 05/16/2024 1000   CO2 23 05/16/2024 1000   GLUCOSE 102 (H) 05/16/2024 1000   BUN 11 05/16/2024 1000   BUN 10 04/21/2022 1049   CREATININE 0.91 05/16/2024 1000   CREATININE 0.67 09/18/2021 1125   CALCIUM 9.1 05/16/2024 1000   PROT 6.5 04/21/2022 1049   ALBUMIN 4.6 04/21/2022 1049   AST 19 04/21/2022 1049   ALT 10 04/21/2022 1049   ALKPHOS 78 04/21/2022 1049   BILITOT 0.4 04/21/2022 1049   GFRNONAA >60 05/16/2024 1000       Component Value Date/Time   WBC 6.1 05/16/2024 1000   RBC 4.71 05/16/2024 1000   HGB 14.7 05/16/2024 1000   HGB 14.5 04/21/2022 1049   HCT 43.2 05/16/2024 1000   HCT 42.6 04/21/2022 1049   PLT 338 05/16/2024 1000   PLT 338 04/21/2022 1049   MCV 91.7 05/16/2024 1000   MCV 92 04/21/2022 1049   MCH 31.2 05/16/2024 1000   MCHC 34.0 05/16/2024 1000   RDW 12.1  05/16/2024 1000   RDW 12.2 04/21/2022 1049   LYMPHSABS 1,995 09/18/2021 1125   MONOABS 570 03/20/2017 1100   EOSABS 251 09/18/2021 1125   BASOSABS 91 09/18/2021 1125    No results found for: POCLITH, LITHIUM   No results found for: PHENYTOIN, PHENOBARB, VALPROATE, CBMZ   .res Assessment: Plan:    Dashawna was seen today for follow-up, depression, anxiety, sleeping problem and manic behavior.  Diagnoses and all orders for this  visit:  Bipolar I disorder, most recent episode (or current) manic (HCC) -     cloNIDine  (CATAPRES ) 0.1 MG tablet; Take 2 tablets (0.2 mg total) by mouth 2 (two) times daily.  PTSD (post-traumatic stress disorder)  Generalized anxiety disorder  Insomnia due to other mental disorder  RLS (restless legs syndrome)  Nightmares associated with chronic post-traumatic stress disorder    30 min face to face time. We discussed the following: Bonnee has treatment resistant bipolar disorder with psychotic features versus schizoaffective disorder and other psychiatric diagnoses as well.  She has been very difficult to treat in part because she is very sensitive to antipsychotics and yet has strong history of chronic auditory hallucinations.    She is chronically anxious and depressed.  Chronic mood cyclin is typical but worse.   Extensive history of psychiatric meds tried discussed with the patient.   Lately mainly manic.     TR insomnia chronically.  But less awakening at present discussed Ambien  amnesia and side effects. No change in sleep meds this visit RLS managed right now. RLS managed and neuro checked iron studies reportedly ok.  her auditory hallucinations have stopped.  But we can still consider if needed, Clozapine option.       Consider off label Rexulti bc less likely to cause RLS than alternatives but she needs generics for cost reasons.  Stopped doxazodine and switched to clonidine .  Increase to 0.1 mg AM and 0.2 mg HS and after a few  days then increase to 0.2 mg BID if mania persists.    No  Caffeine after noon.  This is helped.   continue Gabapentin  for off label anxiety and RLS at previous dosage.  400 BID and 800 pm.  This has been helpful though has not resolve the problem.   Discussed the risk of polypharmacy.  She is on multiple medications.  Disc purpose of each of the meds and consider changing some of them.  Undesirable polypharmacy but necessary bc chronic psych instability with history severe sx.she is benefitting and tolerating meds.  Counseled patient regarding potential benefits, risks, and side effects of Lamictal  to include potential risk of Stevens-Johnson syndrome. Advised patient to stop taking Lamictal  and contact office immediately if rash develops and to seek urgent medical attention if rash is severe and/or spreading quickly.  Off label for mania resistant to Latuda  160 mg daily: clonidine .  Increase to 0.1 mg AM and continue 0.2 mg HS and after a few days then increase to 0.2 mg BID if mania persists.   Lamictal  150  Continue cyclobenzaprine  20 mg nightly for RLS Continue doxazosin  6 mg nightly For RLS gabapentin  400 mg twice daily and 1200 mg nightly Continue lorazepam  1 mg BID  and 2 tabs HS ropinirole  2 mg at 3 PM and 8 mg nightly. High dose necessary to control RLS  Stopped sertralaine and increased  Latuda  to 160 mg daily.  If Latuda  10 fails then retry Caplyta and wean Latuda   Continue Lunesta  3 for longer duration vs Ambien .  Discussed side effects including amnesia.  Discussed the very high dose of recurrent ropinirole  and possible psychiatric side effects with that.  However required to manage RLS AT FU disc iron treatment recommended now for this which might allow weaning ropinirole .  Need counseling and started Rogelia Bolognese at [facility].  . Previously Discussed safety plan at length with patient.  Advised patient to contact office with any worsening signs and symptoms.   Instructed patient to go  to the [facility] emergency room for evaluation if experiencing any acute safety concerns, to include suicidal intent.  Discussed potential metabolic side effects associated with atypical antipsychotics, as well as potential risk for movement side effects. Advised pt to contact office if movement side effects occur.   She agrees with the plan  Follow-up 1 mos  Lorene Macintosh MD, DFAPA Please see After Visit Summary for patient specific instructions.  Future Appointments  Date Time Provider Department Center  07/27/2024 10:00 AM Glennon Almarie POUR, MD [facility] None  08/18/2024  4:00 PM Cottle, Lorene KANDICE Raddle., MD [facility] None       No orders of the defined types were placed in this encounter.    -------------------------------

## 2024-06-14 NOTE — Patient Instructions (Signed)
 clonidine .  Increase to 0.1 mg AM and continue 0.2 mg HS and after a few days then increase to 0.2 mg BID if mania persists.

## 2024-06-26 ENCOUNTER — Other Ambulatory Visit: Payer: Self-pay | Admitting: Psychiatry

## 2024-06-26 DIAGNOSIS — G2581 Restless legs syndrome: Secondary | ICD-10-CM

## 2024-06-26 DIAGNOSIS — F5105 Insomnia due to other mental disorder: Secondary | ICD-10-CM

## 2024-06-28 ENCOUNTER — Other Ambulatory Visit: Payer: Self-pay | Admitting: Psychiatry

## 2024-06-28 DIAGNOSIS — F3132 Bipolar disorder, current episode depressed, moderate: Secondary | ICD-10-CM

## 2024-06-28 NOTE — Telephone Encounter (Signed)
 If Latuda  10 fails then retry Caplyta and wean Latuda    Call to see how she is doing on the above.

## 2024-06-30 NOTE — Telephone Encounter (Signed)
 Called patient to ask about the Latuda . She said she can't afford Caplyta and never talked with provider about this. I asked how the Latuda  was working. She said not well, but she would talk to provider at next visit.

## 2024-07-01 ENCOUNTER — Encounter: Payer: Self-pay | Admitting: Obstetrics and Gynecology

## 2024-07-01 DIAGNOSIS — R102 Pelvic and perineal pain: Secondary | ICD-10-CM

## 2024-07-01 NOTE — Telephone Encounter (Signed)
 Spoke with patient, patient declines OV today.  Advised Dr. Glennon is out of the office Monday and Tuesday, patient preference is to schedule with Dr. Glennon.   Lab appt scheduled for UA and culture on 7/21 at 0900.   OV scheduled for 7/23 at 1045, patient aware this is a work in appt, may be a delay.   Will update Dr. Glennon and return call if any additional recommendations.   ER/Urgent care precautions reviewed.   Dr. Glennon -ok to proceed as scheduled?

## 2024-07-04 ENCOUNTER — Other Ambulatory Visit

## 2024-07-04 ENCOUNTER — Other Ambulatory Visit: Payer: Self-pay | Admitting: Psychiatry

## 2024-07-04 DIAGNOSIS — F411 Generalized anxiety disorder: Secondary | ICD-10-CM

## 2024-07-04 DIAGNOSIS — R102 Pelvic and perineal pain: Secondary | ICD-10-CM

## 2024-07-04 DIAGNOSIS — F5105 Insomnia due to other mental disorder: Secondary | ICD-10-CM

## 2024-07-04 DIAGNOSIS — F4310 Post-traumatic stress disorder, unspecified: Secondary | ICD-10-CM

## 2024-07-04 LAB — URINALYSIS, COMPLETE W/RFL CULTURE
Bacteria, UA: NONE SEEN /HPF
Bilirubin Urine: NEGATIVE
Glucose, UA: NEGATIVE
Hgb urine dipstick: NEGATIVE
Hyaline Cast: NONE SEEN /LPF
Ketones, ur: NEGATIVE
Leukocyte Esterase: NEGATIVE
Nitrites, Initial: NEGATIVE
Protein, ur: NEGATIVE
RBC / HPF: NONE SEEN /HPF (ref 0–2)
Specific Gravity, Urine: 1.02 (ref 1.001–1.035)
WBC, UA: NONE SEEN /HPF (ref 0–5)
pH: 6 (ref 5.0–8.0)

## 2024-07-04 LAB — NO CULTURE INDICATED

## 2024-07-05 ENCOUNTER — Ambulatory Visit: Payer: Self-pay | Admitting: Nurse Practitioner

## 2024-07-05 ENCOUNTER — Ambulatory Visit (INDEPENDENT_AMBULATORY_CARE_PROVIDER_SITE_OTHER): Admitting: Family Medicine

## 2024-07-05 ENCOUNTER — Encounter: Payer: Self-pay | Admitting: Family Medicine

## 2024-07-05 VITALS — BP 135/82 | HR 110 | Temp 98.2°F | Ht 67.0 in | Wt 183.8 lb

## 2024-07-05 DIAGNOSIS — R051 Acute cough: Secondary | ICD-10-CM

## 2024-07-05 LAB — POC SOFIA SARS ANTIGEN FIA: SARS Coronavirus 2 Ag: NEGATIVE

## 2024-07-05 NOTE — Progress Notes (Signed)
    SUBJECTIVE:   CHIEF COMPLAINT / HPI:   Beth [name] is a 48yo F that pf cough - Cough started yesterday - Felt warm, had temp of 98.0  - Mild shortness of breath, but less right now  - Normal appetite, no N/V/D - Pt does not smoke. Partner smokes, but not around pt.   PERTINENT  PMH / PSH: COPD, bipolar  OBJECTIVE:   BP 135/82   Pulse (!) 110   Temp 98.2 F (36.8 C) (Oral)   Ht 5' 7 (1.702 m)   Wt 183 lb 12.8 oz (83.4 kg)   LMP 05/11/2024 (Exact Date)   SpO2 95%   BMI 28.79 kg/m   General: Alert, pleasant woman. NAD. HEENT: NCAT. MMM. CV: RRR, no murmurs.   Resp: CTAB, no wheezing or crackles. Normal WOB on RA.   Ext: Moves all ext spontaneously Skin: Warm, well perfused   ASSESSMENT/PLAN:   Assessment & Plan Acute cough Hx most cw viral URI. Will check covid swab per pt preference. Low cf COPD/asthma exac given normal lung exam, VSS.  - f/u covid swab - Provided reassurance. Counseled on supportive care.   Addendum: Covid negative   Beth Nearing, MD [facility]

## 2024-07-05 NOTE — Patient Instructions (Signed)
 You most likely have a viral upper respiratory infection (aka a cold). Your body will naturally fight off this infection over the next 7-14 days. You may have a lingering cough for 6-8 weeks after the infection is gone, this is normal and helps to clear debris out of your lungs.   - You can see the results of your covid swab on MyChart. -Drink plenty of water and fluids to stay hydrated.  Your urine should be clear or light yellow. -Take Tylenol  or ibuprofen  as needed for fevers of the 100.3 Fahrenheit or higher -You can take 1 tablespoon of bees honey 4 times a day.  This can help soothe your throat.  Please seek further medical attention if you: - have trouble breathing - are vomiting uncontrollably - are unable to drink enough to stay hydrated - have fevers of 100.96F or higher for 3 days in a row

## 2024-07-06 ENCOUNTER — Ambulatory Visit: Admitting: Obstetrics and Gynecology

## 2024-07-08 ENCOUNTER — Ambulatory Visit

## 2024-07-08 NOTE — Progress Notes (Deleted)
 Beth [name]

## 2024-07-11 ENCOUNTER — Telehealth: Payer: Self-pay | Admitting: Obstetrics and Gynecology

## 2024-07-12 NOTE — Progress Notes (Unsigned)
 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 Asthma?       Had excellent ex tol and no need for any inhalers prior 03/2022 cxr at [facility] cone neg but rx as CAP and nebulizer turned things and never completely recovered maybe 75% never tried then acutely ill 01/30/23 severe cough > yellow mucus chest tightness sob  01/31/23 minute clinic pos type A flu rx tamiflu > overall 50% but no neb but continue to use inhaler.   History of Present Illness  02/06/2023  Pulmonary/ 1st office eval/Beth [name]  Chief Complaint  Patient presents with   Consult    Pneumonia 04/2022.  Influenza 02/01/23. Chest tightness, cough and wheeze.  Sx resolved by next day.  Dyspnea:  with housecleaning uses saba  seems to help -not working out yet  Cough: better now but not gone > am only is still slt  yellow  Sleep: disrupted since 01/30/23 at least once a night / mucinex  SABA use: much less now  Rec Zpak  Plan A = Automatic = Always=    Symbicort  80 Take 2 puffs first thing in am and then another 2 puffs about 12 hours later.  Work on inhaler technique: Plan B = Backup (to supplement plan A, not to replace it) Only use your albuterol  inhaler as a rescue medication  Plan C = Crisis (instead of Plan B but only if Plan B stops working) - only use your albuterol  nebulizer if you first try Plan B  Also  Ok to try albuterol  15 min before an activity (on alternating days)  that you know would usually make you short of breath Try prilosec otc 20mg   Take 30-60 min before first meal of the day and Pepcid ac (famotidine) 20 mg one @  bedtime until cough is completely gone for at least a week without the need for cough suppression GERD diet reviewed, bed blocks rec  Please schedule a follow up office visit in 2 weeks, sooner if needed  with all  medications /inhalers/ solutions in hand     02/17/2023  f/u ov/Beth [name] re: asthma/ doe    maint on  symbicort  80  2 bid / no meds/ using mints Chief Complaint  Patient presents with   Follow-up    Breathing has improved with symbicort . She was dx with flu 2 wks ago and has had prod cough with green sputum. She uses her albuterol  inhaler about once per wk.   Dyspnea:  housecleaning  Cough: still coughing up green mucus sporadically during the day  Sleeping: on side flat bed one pillow no resp resp SABA use: none 02: none  Covid status:   vax x 3 not most recent Rec Augmentin  875 mg take one pill twice daily  X 10 days  Prednisone  10 mg take  4 each am x 2 days,   2 each am x 2 days,  1 each am x 2 days and stop Work on inhaler technique:    Also  Ok to try albuterol  15 min before an activity (on alternating days)  that you know would usually make you short of breath    10/27/2023  f/u ov/Beth [name] re: asthma changed  maint to trelegy 100  during acute flare on symbicort  160  with criteria for GOLD 2 copd during flare Chief Complaint  Patient presents with   Cough variant asthma  Dyspnea:  decline prior to flare with housecleaning /  Cough: feels due to PNDS  Sleeping: flat s resp cc  SABA use: none  02: none  Rec Plan A = Automatic = Always=    change to Breztri  Take 2 puffs first thing in am and then another 2 puffs about 12 hours later.   Work on inhaler technique:   Plan B = Backup (to supplement plan A, not to replace it) Only use your albuterol  inhaler as a rescue medication  Plan C = Crisis (instead of Plan B but only if Plan B stops working) - only use your albuterol  nebulizer if you first try Plan B and it fails to help > ok to use the nebulizer up to every 4 hours but if start needing it regularly call for immediate appointment  If not satisfied with control of your symptoms, please call for allergy referral  Please schedule a follow up visit in 6 months but  call sooner if needed  with all respiratory medications /inhalers/ solutions in hand     07/14/2024  ACUTE  ov/Beth [name] re: rhinitis/ asthma  maint on  Breztri   and did fine without chronic nasal or chest symptoms until 07/05/24  Chief Complaint  Patient presents with   COPD  Dyspnea:  basically only  when coughing  Cough: mucus is yellow 24x 7 / severe intensity esp  in ams Sleeping: flat bed with 2 pillows = baseline with marked  increase in noct cough     SABA use: hfa x 3 since / onset no nebs 02: none   Lung cancer screening: referred today    No obvious day to day or daytime variability or assoc  mucus plugs or hemoptysis or cp or chest tightness, subjective wheeze or overt sinus or hb symptoms.    Also denies any obvious fluctuation of symptoms with weather or environmental changes or other aggravating or alleviating factors except as outlined above   No unusual exposure hx or h/o childhood pna/ asthma or knowledge of premature birth.  Current Allergies, Complete Past Medical History, Past Surgical History, Family History, and Social History were reviewed in [employer] record.  ROS  The following are not active complaints unless bolded Hoarseness, sore throat, dysphagia, dental problems, itching, sneezing,  nasal congestion or discharge of excess mucus or purulent secretions, ear ache,   fever, chills, sweats, unintended wt loss or wt gain, classically pleuritic or exertional cp,  orthopnea pnd or arm/hand swelling  or leg swelling, presyncope, palpitations, abdominal pain, anorexia, nausea, vomiting, diarrhea  or change in bowel habits or change in bladder habits, change in stools or change in urine, dysuria, hematuria,  rash, arthralgias, visual complaints, headache, numbness, weakness or ataxia or problems with walking or coordination,  change in mood or  memory.        Current Meds  Medication Sig   acetaminophen  (TYLENOL ) 500 MG tablet  Take 1,000 mg by mouth every 6 (six) hours as needed.   albuterol  (VENTOLIN  HFA) 108 (90 Base) MCG/ACT inhaler Inhale 2 puffs into the lungs every 6 (six) hours as needed for wheezing or shortness of breath.   azithromycin  (ZITHROMAX ) 250 MG tablet Take 2 on day one then 1 daily x 4 days   Budeson-Glycopyrrol-Formoterol  (BREZTRI  AEROSPHERE) 160-9-4.8 MCG/ACT AERO Inhale 2 puffs into the lungs 2 (two) times daily. Take  2 puffs first thing in am and then another 2 puffs about 12 hours later.   cloNIDine  (CATAPRES ) 0.1 MG tablet Take 2 tablets (0.2 mg total) by mouth 2 (two) times daily.   cyclobenzaprine  (FLEXERIL ) 10 MG tablet Take 2 tablets (20 mg total) by mouth at bedtime.   docusate sodium  (COLACE) 100 MG capsule Take 1 capsule (100 mg total) by mouth 2 (two) times daily.   estradiol  (ESTRACE  VAGINAL) 0.1 MG/GM vaginal cream Rub pea size amount each night for 3 weeks then 3 times a week thereafter.   Eszopiclone  3 MG TABS TAKE 1 TABLET BY MOUTH EVERY NIGHT IMMEDIATELY BEFORE BEDTIME   fluticasone  (FLONASE ) 50 MCG/ACT nasal spray Place 2 sprays into both nostrils daily.   gabapentin  (NEURONTIN ) 400 MG capsule TAKE 1 CAPSULE TWICE DAILY AND TAKE 3 CAPSULES IN THE EVENING   ibuprofen  (ADVIL ) 200 MG tablet Take 800 mg by mouth every 6 (six) hours as needed.   ibuprofen  (ADVIL ) 800 MG tablet Take 1 tablet (800 mg total) by mouth every 8 (eight) hours as needed.   lamoTRIgine  (LAMICTAL ) 150 MG tablet Take 1 tablet (150 mg total) by mouth daily.   LORazepam  (ATIVAN ) 1 MG tablet TAKE 1 TABLET BY MOUTH 2 TIMES A DAY AND TAKE 2 TABLETS BY MOUTH ONCE NIGHTLY   lurasidone  (LATUDA ) 80 MG TABS tablet TAKE 1 TABLET TWICE DAILY   metoCLOPramide  (REGLAN ) 10 MG tablet Take 1 tablet (10 mg total) by mouth every 8 (eight) hours as needed for nausea.   Multiple Vitamin (MULTIVITAMIN) tablet Take 1 tablet by mouth daily.   Naphazoline HCl (CLEAR EYES OP) Apply to eye as needed.   polyethylene glycol (MIRALAX ) 17 g  packet Take 17 g by mouth daily.   Prasterone  (INTRAROSA ) 6.5 MG INST Place 1 suppository vaginally at bedtime as needed.   predniSONE  (DELTASONE ) 10 MG tablet Take  4 each am x 2 days,   2 each am x 2 days,  1 each am x 2 days and stop   Psyllium (METAMUCIL PO) Take by mouth.   rOPINIRole  (REQUIP ) 2 MG tablet TAKE 1 TABLET AT 3PM   rOPINIRole  (REQUIP ) 4 MG tablet TAKE 2 TABLETS AT BEDTIME   Sodium Bicarbonate -Citric Acid (ALKA-SELTZER PO) Take by mouth as needed (heartburn/ regurg).   valACYclovir  (VALTREX ) 500 MG tablet Take one tablet by mouth twice daily for 3-5 days then daily as needed.              Objective:    Wts  07/14/2024       186  10/27/2023     183   02/17/23 178 lb (80.7 kg)  02/06/23 173 lb 3.2 oz (78.6 kg)  01/20/23 170 lb (77.1 kg)     Vital signs reviewed  07/14/2024  - Note at rest 02 sats  98% on RA   General appearance:    amb wf nad    HEENT : Oropharynx  clear   Nasal turbinates min edema    NECK :  without  apparent JVD/ palpable Nodes/TM    LUNGS: no acc muscle use,  Min barrel /mild kyphotic  contour chest wall with bilateral pan exp wheeze and  without cough on insp or exp maneuvers and min  Hyperresonant  to  percussion bilaterally    CV:  RRR  no s3 or murmur or increase in P2, and no edema   ABD:  soft and nontender with pos end  insp Hoover's  in the supine position.  No bruits or organomegaly appreciated   MS:  Nl gait/ ext warm without deformities Or obvious joint restrictions  calf tenderness, cyanosis or clubbing     SKIN: warm and dry without lesions    NEURO:  alert, approp, nl sensorium with  no motor or cerebellar deficits apparent.        CXR PA and Lateral:   07/14/2024 :    I personally reviewed images and impression is as follows:     Mild thoracic  kyphosis - not acute findings     Assessment

## 2024-07-14 ENCOUNTER — Encounter

## 2024-07-14 ENCOUNTER — Ambulatory Visit (HOSPITAL_COMMUNITY)
Admission: RE | Admit: 2024-07-14 | Discharge: 2024-07-14 | Disposition: A | Discharge status: Home / Self Care | Source: Ambulatory Visit | Attending: Internal Medicine | Admitting: Internal Medicine

## 2024-07-14 ENCOUNTER — Other Ambulatory Visit: Payer: Self-pay | Admitting: Psychiatry

## 2024-07-14 ENCOUNTER — Ambulatory Visit: Admitting: Internal Medicine

## 2024-07-14 ENCOUNTER — Encounter: Payer: Self-pay | Admitting: Internal Medicine

## 2024-07-14 VITALS — BP 109/72 | HR 76 | Ht 67.0 in | Wt 186.6 lb

## 2024-07-14 DIAGNOSIS — F4310 Post-traumatic stress disorder, unspecified: Secondary | ICD-10-CM

## 2024-07-14 DIAGNOSIS — F411 Generalized anxiety disorder: Secondary | ICD-10-CM

## 2024-07-14 DIAGNOSIS — F315 Bipolar disorder, current episode depressed, severe, with psychotic features: Secondary | ICD-10-CM

## 2024-07-14 DIAGNOSIS — J449 Chronic obstructive pulmonary disease, unspecified: Secondary | ICD-10-CM | POA: Diagnosis present

## 2024-07-14 DIAGNOSIS — Z87891 Personal history of nicotine dependence: Secondary | ICD-10-CM | POA: Diagnosis not present

## 2024-07-14 MED ORDER — AZITHROMYCIN 250 MG PO TABS: ORAL_TABLET | ORAL | 0 refills | Status: DC

## 2024-07-14 MED ORDER — BREZTRI AEROSPHERE 160-9-4.8 MCG/ACT IN AERO
2.0000 | INHALATION_SPRAY | Freq: Two times a day (BID) | RESPIRATORY_TRACT | Status: AC
Start: 2024-07-14 — End: 2024-07-15

## 2024-07-14 MED ORDER — PREDNISONE 10 MG PO TABS: ORAL_TABLET | ORAL | 0 refills | Status: DC

## 2024-07-14 NOTE — Telephone Encounter (Signed)
Stopped this med

## 2024-07-14 NOTE — Patient Instructions (Addendum)
 My office will be contacting you by phone for referral to lung cancer screening   [phone]) - if you don't hear back from my office within one week,  please call us  back or notify us  thru MyChart and we'll address it right away.    Zpak   Prednisone  10 mg take  4 each am x 2 days,   2 each am x 2 days,  1 each am x 2 days and stop   For cough > mucinex dm 12 mg every 12 hours as needed   Work on inhaler technique:  relax and gently blow all the way out then take a nice smooth full deep breath back in, triggering the inhaler at same time you start breathing in.  Hold breath in for at least  5 seconds if you can. Blow out Breztri  thru nose. Rinse and gargle with water when done.  If mouth or throat bother you at all,  try brushing teeth/gums/tongue with arm and hammer toothpaste/ make a slurry and gargle and spit out.   Please remember to go to the  x-ray department  @  [facility] for your tests - we will call you with the results when they are available     Follow up is as needed

## 2024-07-15 ENCOUNTER — Encounter: Payer: Self-pay | Admitting: Psychiatry

## 2024-07-15 ENCOUNTER — Ambulatory Visit: Admitting: Psychiatry

## 2024-07-15 DIAGNOSIS — G2581 Restless legs syndrome: Secondary | ICD-10-CM

## 2024-07-15 DIAGNOSIS — F4312 Post-traumatic stress disorder, chronic: Secondary | ICD-10-CM | POA: Diagnosis not present

## 2024-07-15 DIAGNOSIS — F3110 Bipolar disorder, current episode manic without psychotic features, unspecified: Secondary | ICD-10-CM | POA: Diagnosis not present

## 2024-07-15 DIAGNOSIS — F5105 Insomnia due to other mental disorder: Secondary | ICD-10-CM

## 2024-07-15 DIAGNOSIS — F99 Mental disorder, not otherwise specified: Secondary | ICD-10-CM

## 2024-07-15 DIAGNOSIS — F515 Nightmare disorder: Secondary | ICD-10-CM | POA: Diagnosis not present

## 2024-07-15 DIAGNOSIS — F411 Generalized anxiety disorder: Secondary | ICD-10-CM

## 2024-07-15 DIAGNOSIS — F4310 Post-traumatic stress disorder, unspecified: Secondary | ICD-10-CM | POA: Diagnosis not present

## 2024-07-15 NOTE — Assessment & Plan Note (Addendum)
 Referred 07/14/2024  (age 48 to start age 68)  Low-dose CT lung cancer screening is recommended for patients who are 66-17 years of age with a 20+ pack-year history of smoking and who are currently smoking or quit <=15 years ago. No coughing up blood  No unintentional weight loss of > 15 pounds in the last 6 months - pt is eligible for scanning yearly until 2036 starting on her 89 th birthday > referred 07/14/2024    Discussed in detail all the  indications, usual  risks and alternatives  relative to the benefits with patient who agrees to proceed with w/u as outlined.             Each maintenance medication was reviewed in detail including emphasizing most importantly the difference between maintenance and prns and under what circumstances the prns are to be triggered using an action plan format where appropriate.  Total time for H and P, chart review, counseling, reviewing hfa device(s) and generating customized AVS unique to this ACUTE office visit / same day charting = 31 min

## 2024-07-15 NOTE — Progress Notes (Signed)
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 3 CAPSULES IN THE EVENING 450 capsule 0   ibuprofen  (ADVIL ) 200 MG tablet Take 800 mg by mouth every 6 (six) hours as needed.     ibuprofen  (ADVIL ) 800 MG tablet Take 1 tablet (800 mg total) by mouth every 8 (eight) hours as needed. 30 tablet 1   lamoTRIgine  (LAMICTAL ) 150 MG tablet Take 1 tablet (150 mg total) by mouth daily. 90 tablet 1   LORazepam  (ATIVAN ) 1 MG tablet TAKE 1 TABLET BY MOUTH 2 TIMES A DAY AND TAKE 2 TABLETS BY MOUTH ONCE NIGHTLY 120 tablet 0   lurasidone  (LATUDA ) 80 MG TABS tablet TAKE 1 TABLET TWICE DAILY 180 tablet 0   metoCLOPramide  (REGLAN ) 10 MG tablet Take 1 tablet (10 mg total) by mouth every 8 (eight) hours as needed for nausea. 10 tablet 0   Multiple Vitamin (MULTIVITAMIN) tablet Take 1 tablet by mouth daily.     Naphazoline HCl (CLEAR EYES OP) Apply to eye as needed.     polyethylene glycol (MIRALAX ) 17 g packet Take 17 g by mouth daily. 14 each 0   Prasterone  (INTRAROSA ) 6.5 MG INST Place 1 suppository vaginally at bedtime as needed. 30 each 12   predniSONE  (DELTASONE ) 10 MG tablet Take  4 each am x 2 days,   2 each am x 2 days,  1 each am x 2 days and stop 14 tablet 0   Psyllium (METAMUCIL PO) Take by mouth.     rOPINIRole  (REQUIP ) 2 MG tablet TAKE 1 TABLET AT 3PM 90 tablet 3   rOPINIRole  (REQUIP ) 4 MG tablet TAKE 2 TABLETS AT BEDTIME 180 tablet 3    Sodium Bicarbonate -Citric Acid (ALKA-SELTZER PO) Take by mouth as needed (heartburn/ regurg).     valACYclovir  (VALTREX ) 500 MG tablet Take one tablet by mouth  twice daily for 3-5 days then daily as needed. 90 tablet 1   No current facility-administered medications for this visit.    Medication Side Effects: nausea  Allergies: No Known Allergies  Past Medical History:  Diagnosis Date   Bipolar disorder with moderate depression (HCC)    psychiastry--- dr c. cottle   COPD (chronic obstructive pulmonary disease) (HCC)    (05-11-2024  pt denies sob w/ any actiivies )  pulmonary--- dr darlean;   COPD likely GOLD 2/ AB, mild;  dyspnea w/ household chores   Cough variant asthma    Fracture    h/o ankle fracture   GAD (generalized anxiety disorder)    GERD (gastroesophageal reflux disease)    History of adenomatous polyp of colon    HSV (herpes simplex virus) anogenital infection 05/2018   Insomnia    Nightmares associated with chronic post-traumatic stress disorder    PTSD (post-traumatic stress disorder)    RLS (restless legs syndrome)    Seasonal allergic rhinitis    Wears glasses     Family History  Problem Relation Age of Onset   Thyroid  disease Mother    COPD Mother    Bipolar disorder Mother    Anxiety disorder Mother    Depression Mother    Parkinson's disease Father    Bipolar disorder Sister    Breast cancer Paternal Grandmother 75   Schizophrenia Other    Colon cancer Neg Hx    Colon polyps Neg Hx    Esophageal cancer Neg Hx    Rectal cancer Neg Hx    Stomach cancer Neg Hx     Social History   Socioeconomic History   Marital status: Married    Spouse name: [name],[name]   Number of children: 0   Years of education: 16   Highest education level: Associate degree: academic program  Occupational History   Occupation: unemployed   Occupation: disabled  Tobacco Use   Smoking status: Former    Types: Cigarettes   Smokeless tobacco: Never   Tobacco comments:     05-11-2024  pt stated quit smoking 2023,  started age 36  (20)  for 62 yrs  Vaping Use   Vaping status: Never Used  Substance and Sexual Activity   Alcohol use: Not Currently   Drug use: Never   Sexual activity: Not Currently    Partners: Male    Birth control/protection: Surgical    Comment: 1st intercourse 25 yo-5 partners, hysterectomy  Other Topics Concern   Not on file  Social History Narrative   Pt is right handed   Lives in single story home with her husband, mother and uncle   Has associated degree   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    Social Drivers of Corporate investment banker Strain: Low Risk  (05/10/2024)   Overall Financial Resource Strain (CARDIA)    Difficulty of Paying Living Expenses: Not hard at all  Food Insecurity: No Food Insecurity (05/10/2024)   Hunger Vital Sign    Worried About Running Out of Food in the Last Year: Never true    Ran Out of Food in the Last Year: Never true  Transportation Needs: No Transportation Needs (05/10/2024)   PRAPARE - Administrator, Civil Service (Medical): No    Lack of Transportation (Non-Medical): No  Physical Activity: Unknown (05/10/2024)   Exercise Vital Sign    Days of Exercise per Week: 0 days    Minutes of Exercise  per Session: Not on file  Stress: Stress Concern Present (05/10/2024)   [employer] of Occupational Health - Occupational Stress Questionnaire    Feeling of Stress : Rather much  Social Connections: Moderately Isolated (05/10/2024)   Social Connection and Isolation Panel    Frequency of Communication with Friends and Family: Twice a week    Frequency of Social Gatherings with Friends and Family: Never    Attends Religious Services: 1 to 4 times per year    Active Member of [employer] or Organizations: No    Attends Banker Meetings: Not on file    Marital Status: Married  Catering manager Violence: Not At Risk (03/09/2023)   Humiliation, Afraid, Rape, and  Kick questionnaire    Fear of Current or Ex-Partner: No    Emotionally Abused: No    Physically Abused: No    Sexually Abused: No    Past Medical History, Surgical history, Social history, and Family history were reviewed and updated as appropriate.   Please see review of systems for further details on the patient's review from today.   Objective:   Physical Exam:  LMP 05/11/2024 (Exact Date)   Physical Exam Constitutional:      General: She is not in acute distress. Musculoskeletal:        General: No deformity.  Neurological:     Mental Status: She is alert and oriented to person, place, and time.     Coordination: Coordination normal.  Psychiatric:        Attention and Perception: Attention and perception normal. She does not perceive auditory or visual hallucinations.        Mood and Affect: Mood is anxious. Mood is not depressed or elated. Affect is not labile, angry, tearful or inappropriate.        Speech: Speech normal. Speech is not rapid and pressured.        Behavior: Behavior normal. Behavior is not agitated, hyperactive or combative.        Thought Content: Thought content is not paranoid or delusional. Thought content does not include homicidal or suicidal ideation. Thought content does not include suicidal plan.        Cognition and Memory: Cognition and memory normal.        Judgment: Judgment normal.     Comments: Insight intact No AIM  manic tension and irritablity for a couple of mos resolved with clonidine       Lab Review:     Component Value Date/Time   NA 134 (L) 05/16/2024 1000   NA 137 04/21/2022 1049   K 4.0 05/16/2024 1000   CL 102 05/16/2024 1000   CO2 23 05/16/2024 1000   GLUCOSE 102 (H) 05/16/2024 1000   BUN 11 05/16/2024 1000   BUN 10 04/21/2022 1049   CREATININE 0.91 05/16/2024 1000   CREATININE 0.67 09/18/2021 1125   CALCIUM 9.1 05/16/2024 1000   PROT 6.5 04/21/2022 1049   ALBUMIN 4.6 04/21/2022 1049   AST 19 04/21/2022 1049    ALT 10 04/21/2022 1049   ALKPHOS 78 04/21/2022 1049   BILITOT 0.4 04/21/2022 1049   GFRNONAA >60 05/16/2024 1000       Component Value Date/Time   WBC 6.1 05/16/2024 1000   RBC 4.71 05/16/2024 1000   HGB 14.7 05/16/2024 1000   HGB 14.5 04/21/2022 1049   HCT 43.2 05/16/2024 1000   HCT 42.6 04/21/2022 1049   PLT 338 05/16/2024 1000   PLT 338 04/21/2022 1049   MCV 91.7  05/16/2024 1000   MCV 92 04/21/2022 1049   MCH 31.2 05/16/2024 1000   MCHC 34.0 05/16/2024 1000   RDW 12.1 05/16/2024 1000   RDW 12.2 04/21/2022 1049   LYMPHSABS 1,995 09/18/2021 1125   MONOABS 570 03/20/2017 1100   EOSABS 251 09/18/2021 1125   BASOSABS 91 09/18/2021 1125    No results found for: POCLITH, LITHIUM   No results found for: PHENYTOIN, PHENOBARB, VALPROATE, CBMZ   .res Assessment: Plan:    Hazelyn was seen today for follow-up, depression, anxiety and sleeping problem.  Diagnoses and all orders for this visit:  Bipolar I disorder, most recent episode (or current) manic (HCC)  PTSD (post-traumatic stress disorder)  Generalized anxiety disorder  Insomnia due to other mental disorder  RLS (restless legs syndrome)  Nightmares associated with chronic post-traumatic stress disorder     30 min face to face time. We discussed the following: Sharde has treatment resistant bipolar disorder with psychotic features versus schizoaffective disorder and other psychiatric diagnoses as well.  She has been very difficult to treat in part because she is very sensitive to antipsychotics and yet has strong history of chronic auditory hallucinations.    She is chronically anxious and depressed.  Chronic mood cyclin is typical but worse.   Extensive history of psychiatric meds tried discussed with the patient.   Lately mainly manic.   TR insomnia chronically.  But less awakening at present discussed Ambien  amnesia and side effects. No change in sleep meds this visit RLS managed right now. RLS managed  and neuro checked iron studies reportedly ok.  her auditory hallucinations have stopped.  But we can still consider if needed, Clozapine option.       Consider off label Rexulti bc less likely to cause RLS than alternatives but she needs generics for cost reasons.  Stopped doxazodine and switched to clonidine .  Increase to 0.1 mg AM and 0.2 mg HS and after a few days then increase to 0.2 mg BID if mania persists.    No  Caffeine after noon.  This is helped.   continue Gabapentin  for off label anxiety and RLS at previous dosage.  400 BID and 800 pm.  This has been helpful though has not resolve the problem.   Discussed the risk of polypharmacy.  She is on multiple medications.  Disc purpose of each of the meds and consider changing some of them.  Undesirable polypharmacy but necessary bc chronic psych instability with history severe sx.she is benefitting and tolerating meds.  Counseled patient regarding potential benefits, risks, and side effects of Lamictal  to include potential risk of Stevens-Johnson syndrome. Advised patient to stop taking Lamictal  and contact office immediately if rash develops and to seek urgent medical attention if rash is severe and/or spreading quickly.  Off label for mania resistant to Latuda  160 mg daily: Clonidine  0.2 mb BID worked but continues NM Increase to 0.2 mg AM and 0.3 mg HS for NM.  After 10 days if they persist and if tolerated increase clonidine  to 0.4 mg HS and continue 0.2 mg AM  Lamictal  150  Continue cyclobenzaprine  20 mg nightly for RLS Continue doxazosin  6 mg nightly For RLS gabapentin  400 mg twice daily and 1200 mg nightly ropinirole  2 mg at 3 PM and 8 mg nightly. High dose necessary to control RLS Continue lorazepam  1 mg BID  and 2 tabs HS  Latuda  to 160 mg daily.  If Latuda  10 fails then retry Caplyta and wean Latuda   Continue Lunesta   3 for longer duration vs Ambien .  Discussed side effects including amnesia.  Discussed the very high  dose of recurrent ropinirole  and possible psychiatric side effects with that.  However required to manage RLS AT FU disc iron treatment recommended now for this which might allow weaning ropinirole .  Need counseling and started Rogelia Bolognese at [facility].  . Previously Discussed safety plan at length with patient.  Advised patient to contact office with any worsening signs and symptoms.  Instructed patient to go to the [facility] emergency room for evaluation if experiencing any acute safety concerns, to include suicidal intent.  Discussed potential metabolic side effects associated with atypical antipsychotics, as well as potential risk for movement side effects. Advised pt to contact office if movement side effects occur.   She agrees with the plan  Rec talk with PCP about IV iron for RLS.  Gave copy of article outlining Am [employer] of Sleep Med recommendations.    Follow-up 1 mos  Lorene Macintosh MD, DFAPA Please see After Visit Summary for patient specific instructions.  Future Appointments  Date Time Provider Department Center  07/27/2024 10:00 AM Glennon Almarie POUR, MD [facility] None  08/18/2024  4:00 PM Cottle, Lorene KANDICE Raddle., MD [facility] None       No orders of the defined types were placed in this encounter.    -------------------------------

## 2024-07-15 NOTE — Assessment & Plan Note (Signed)
 since 2 /17/24   - 02/06/2023   try symb 80 2bid and max gerd rx  - 02/17/2023 improved noct symptoms but cc green mucus daytime and doe no better - 02/17/2023   Walked on RA  x  3  lap(s) =  approx 750  ft  @ fast pace, stopped due to end of study/ min sob with lowest 02 sats 96% - 02/17/2023    continue symbicort  / gerd rx  - Spirometry  10/08/23  FEV1 1.90 (61%)  Ratio 0.66 p saba and mildly concave FV loop  - 10/27/2023  After extensive coaching inhaler device,  effectiveness =    80% > change to breztri   - 07/14/2024  After extensive coaching inhaler device,  effectiveness =    80% from baseline 60 (short ti on hfa) > continue breztri  2bid and approp saba    Acute exac in setting of midly purulent rhinosinusitis/ bronchitis with nl cxr but  with active wheeze so rec   Zpa/ prednisone / mucinex dm and approp saba using the ABC action plan/ reviewed.

## 2024-07-21 ENCOUNTER — Encounter: Payer: Self-pay | Admitting: Internal Medicine

## 2024-07-22 ENCOUNTER — Ambulatory Visit: Payer: Self-pay | Admitting: Internal Medicine

## 2024-07-22 NOTE — Progress Notes (Signed)
 Spoke with pt regarding her cxr, she stated she saw it online. NFN

## 2024-07-27 ENCOUNTER — Ambulatory Visit: Payer: Self-pay | Admitting: Obstetrics and Gynecology

## 2024-07-27 ENCOUNTER — Encounter: Payer: Self-pay | Admitting: Obstetrics and Gynecology

## 2024-07-27 ENCOUNTER — Ambulatory Visit (INDEPENDENT_AMBULATORY_CARE_PROVIDER_SITE_OTHER): Admitting: Obstetrics and Gynecology

## 2024-07-27 VITALS — BP 110/64 | HR 87 | Wt 187.4 lb

## 2024-07-27 DIAGNOSIS — N898 Other specified noninflammatory disorders of vagina: Secondary | ICD-10-CM

## 2024-07-27 DIAGNOSIS — Z09 Encounter for follow-up examination after completed treatment for conditions other than malignant neoplasm: Secondary | ICD-10-CM

## 2024-07-27 LAB — WET PREP FOR TRICH, YEAST, CLUE

## 2024-07-27 MED ORDER — ESTRADIOL 0.0375 MG/24HR TD PTTW: 1.0000 | MEDICATED_PATCH | TRANSDERMAL | 12 refills | Status: AC

## 2024-07-27 MED ORDER — FLUCONAZOLE 150 MG PO TABS: 150.0000 mg | ORAL_TABLET | Freq: Once | ORAL | 1 refills | Status: AC

## 2024-07-27 MED ORDER — PROGESTERONE MICRONIZED 100 MG PO CAPS: 100.0000 mg | ORAL_CAPSULE | Freq: Every day | ORAL | 4 refills | Status: AC

## 2024-07-27 MED ORDER — METRONIDAZOLE 500 MG PO TABS: 500.0000 mg | ORAL_TABLET | Freq: Two times a day (BID) | ORAL | 0 refills | Status: AC

## 2024-07-27 NOTE — Patient Instructions (Signed)
 Your culture did show BV.  This is not an STI and your partner does not need treatment.  I will send in flagyl  500mg  po twice daily and a yeast prevention medication after you finish(diflucan ) Dr. Glennon

## 2024-07-27 NOTE — Progress Notes (Signed)
 Patient presents for 10 week postop from [facility], bilateral salpingectomy, cystoscopy. She is doing well. No fevers, VB, dysuria or severe abdominal pain. Does report some vasomotor symptoms and vaginal dryness, and insomnia. Using vaginal estrogen but cumbersome   BP 110/64   Pulse 87   Wt 187 lb 6.4 oz (85 kg)   LMP 05/11/2024 (Exact Date)   SpO2 96%   BMI 29.35 kg/m   SVE: sutures not seen, possible bv, normal discharge, no bleeding Some tenderness with q tip palpation  A/p PO from [facility] 10 weeks doing well Resume activities 2. Pelvic rest for additional 2 wks 3. Encouraged annual mammograms and resume annual care 4. To begin 0.037 twice weekly estrogen patch. Prometrium  at bedtime for sleep. Reviewed other options for vaginal estrogen and she states she will stay with the cream for now.  Dr. Glennon

## 2024-07-27 NOTE — Addendum Note (Signed)
 Addended by: GLENNON ALMARIE POUR on: 07/27/2024 10:56 AM   Modules accepted: Orders

## 2024-08-06 ENCOUNTER — Other Ambulatory Visit: Payer: Self-pay | Admitting: Psychiatry

## 2024-08-06 DIAGNOSIS — F411 Generalized anxiety disorder: Secondary | ICD-10-CM

## 2024-08-06 DIAGNOSIS — F4310 Post-traumatic stress disorder, unspecified: Secondary | ICD-10-CM

## 2024-08-06 DIAGNOSIS — F5105 Insomnia due to other mental disorder: Secondary | ICD-10-CM

## 2024-08-18 ENCOUNTER — Ambulatory Visit: Admitting: Psychiatry

## 2024-09-01 ENCOUNTER — Other Ambulatory Visit: Payer: Self-pay | Admitting: Psychiatry

## 2024-09-01 DIAGNOSIS — F3110 Bipolar disorder, current episode manic without psychotic features, unspecified: Secondary | ICD-10-CM

## 2024-09-06 ENCOUNTER — Other Ambulatory Visit: Payer: Self-pay | Admitting: Psychiatry

## 2024-09-06 DIAGNOSIS — F4310 Post-traumatic stress disorder, unspecified: Secondary | ICD-10-CM

## 2024-09-06 DIAGNOSIS — F5105 Insomnia due to other mental disorder: Secondary | ICD-10-CM

## 2024-09-06 DIAGNOSIS — F411 Generalized anxiety disorder: Secondary | ICD-10-CM

## 2024-09-14 DIAGNOSIS — J22 Unspecified acute lower respiratory infection: Secondary | ICD-10-CM | POA: Diagnosis not present

## 2024-09-14 DIAGNOSIS — Z6825 Body mass index (BMI) 25.0-25.9, adult: Secondary | ICD-10-CM | POA: Diagnosis not present

## 2024-09-24 ENCOUNTER — Other Ambulatory Visit: Payer: Self-pay | Admitting: Internal Medicine

## 2024-10-06 ENCOUNTER — Other Ambulatory Visit: Payer: Self-pay | Admitting: Psychiatry

## 2024-10-06 DIAGNOSIS — F4310 Post-traumatic stress disorder, unspecified: Secondary | ICD-10-CM

## 2024-10-06 DIAGNOSIS — F4312 Post-traumatic stress disorder, chronic: Secondary | ICD-10-CM

## 2024-10-06 DIAGNOSIS — G2581 Restless legs syndrome: Secondary | ICD-10-CM

## 2024-10-17 ENCOUNTER — Other Ambulatory Visit: Payer: Self-pay | Admitting: Psychiatry

## 2024-10-17 DIAGNOSIS — F5105 Insomnia due to other mental disorder: Secondary | ICD-10-CM

## 2024-10-17 DIAGNOSIS — G2581 Restless legs syndrome: Secondary | ICD-10-CM

## 2024-10-20 ENCOUNTER — Ambulatory Visit: Admitting: Psychiatry

## 2024-10-20 ENCOUNTER — Encounter: Payer: Self-pay | Admitting: Psychiatry

## 2024-10-20 DIAGNOSIS — F5105 Insomnia due to other mental disorder: Secondary | ICD-10-CM

## 2024-10-20 DIAGNOSIS — F99 Mental disorder, not otherwise specified: Secondary | ICD-10-CM | POA: Diagnosis not present

## 2024-10-20 DIAGNOSIS — F515 Nightmare disorder: Secondary | ICD-10-CM

## 2024-10-20 DIAGNOSIS — F4310 Post-traumatic stress disorder, unspecified: Secondary | ICD-10-CM

## 2024-10-20 DIAGNOSIS — F411 Generalized anxiety disorder: Secondary | ICD-10-CM

## 2024-10-20 DIAGNOSIS — F3110 Bipolar disorder, current episode manic without psychotic features, unspecified: Secondary | ICD-10-CM

## 2024-10-20 DIAGNOSIS — F4312 Post-traumatic stress disorder, chronic: Secondary | ICD-10-CM | POA: Diagnosis not present

## 2024-10-20 DIAGNOSIS — G2581 Restless legs syndrome: Secondary | ICD-10-CM | POA: Diagnosis not present

## 2024-10-20 MED ORDER — QUETIAPINE FUMARATE ER 300 MG PO TB24: ORAL_TABLET | ORAL | 0 refills | Status: DC

## 2024-10-20 NOTE — Patient Instructions (Signed)
  Start quetiapine  ER 300 mg tablet 1 at night for 2-3 nights then increase to 2 of the 300 mg tablets nightly Reduce Latuda  to 80 mg daily for 4 days then reduce 1/2 tablet for 4 days then stop it.

## 2024-10-20 NOTE — Progress Notes (Signed)
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   cyclobenzaprine  (FLEXERIL ) 10 MG tablet TAKE 2 TABLETS AT BEDTIME 180 tablet 0   docusate sodium  (COLACE) 100 MG capsule Take 1 capsule (100 mg total) by mouth 2 (two) times daily. 10 capsule 0   estradiol  (DOTTI ) 0.0375 MG/24HR Place 1 patch onto the skin 2 (two) times a week. 8 patch 12   estradiol  (ESTRACE  VAGINAL) 0.1 MG/GM vaginal cream Rub pea size amount each night for 3 weeks then 3 times a week thereafter. 42.5 g 12   Eszopiclone  3 MG TABS TAKE 1 TABLET BY MOUTH ONCE NIGHTLY IMMEDIATELY BEFORE BEDTIME 30 tablet 1   fluticasone  (FLONASE ) 50 MCG/ACT nasal spray Place 2 sprays into both nostrils daily. 16 g 6   gabapentin  (NEURONTIN ) 400 MG capsule TAKE 1 CAPSULE TWICE DAILY AND TAKE 3 CAPSULES IN THE EVENING 450 capsule 3   ibuprofen  (ADVIL ) 200 MG tablet Take 800 mg by mouth every 6 (six) hours as needed.     ibuprofen  (ADVIL ) 800 MG tablet Take 1 tablet (800 mg total) by mouth every 8 (eight) hours as needed. 30 tablet 1   lamoTRIgine  (LAMICTAL ) 150 MG tablet Take 1 tablet (150 mg total) by mouth daily. 90 tablet 1   LORazepam  (ATIVAN ) 1 MG tablet TAKE 1 TABLET BY MOUTH 2 TIMES A DAY AND 2 TABLETS AT NIGHT 120 tablet 1   lurasidone  (LATUDA ) 80 MG TABS tablet TAKE 1 TABLET TWICE DAILY 180 tablet 0   metroNIDAZOLE  (FLAGYL ) 500 MG tablet Take 1 tablet (500 mg total) by  mouth 2 (two) times daily. 14 tablet 0   Multiple Vitamin (MULTIVITAMIN) tablet Take 1 tablet by mouth daily.     Naphazoline HCl (CLEAR EYES OP) Apply to eye as needed.  polyethylene glycol (MIRALAX ) 17 g packet Take 17 g by mouth daily. (Patient not taking: Reported on 07/27/2024) 14 each 0   Prasterone  (INTRAROSA ) 6.5 MG INST Place 1 suppository vaginally at bedtime as needed. (Patient not taking: Reported on 07/27/2024) 30 each 12   progesterone  (PROMETRIUM ) 100 MG capsule Take 1 capsule (100 mg total) by mouth at bedtime. 90 capsule 4   Psyllium (METAMUCIL PO) Take by mouth.     rOPINIRole  (REQUIP ) 2 MG tablet TAKE 1 TABLET AT 3PM 90 tablet 0   rOPINIRole  (REQUIP ) 4 MG tablet TAKE 2 TABLETS AT BEDTIME 180 tablet 0   Sodium Bicarbonate -Citric Acid (ALKA-SELTZER PO) Take by mouth as needed (heartburn/ regurg).     valACYclovir  (VALTREX ) 500 MG tablet Take one tablet by mouth twice daily for 3-5 days then daily as needed. 90 tablet 1   No current facility-administered medications for this visit.    Medication Side Effects: nausea  Allergies: No Known Allergies  Past Medical History:  Diagnosis Date   Bipolar disorder with moderate depression (HCC)    psychiastry--- dr c. cottle   COPD (chronic obstructive pulmonary disease) (HCC)    (05-11-2024  pt denies sob w/ any actiivies )  pulmonary--- dr darlean;   COPD likely GOLD 2/ AB, mild;  dyspnea w/ household chores   Cough variant asthma    Fracture    h/o ankle fracture   GAD (generalized anxiety disorder)    GERD (gastroesophageal reflux disease)    History of adenomatous polyp of colon    HSV (herpes simplex virus) anogenital infection 05/2018   Insomnia    Nightmares associated with chronic post-traumatic stress disorder    PTSD (post-traumatic stress disorder)    RLS (restless legs syndrome)    Seasonal allergic rhinitis    Wears glasses     Family History  Problem Relation Age of Onset   Thyroid  disease Mother    COPD  Mother    Bipolar disorder Mother    Anxiety disorder Mother    Depression Mother    Parkinson's disease Father    Bipolar disorder Sister    Breast cancer Paternal Grandmother 63   Schizophrenia Other    Colon cancer Neg Hx    Colon polyps Neg Hx    Esophageal cancer Neg Hx    Rectal cancer Neg Hx    Stomach cancer Neg Hx     Social History   Socioeconomic History   Marital status: Married    Spouse name: [name],[name]   Number of children: 0   Years of education: 16   Highest education level: Associate degree: academic program  Occupational History   Occupation: unemployed   Occupation: disabled  Tobacco Use   Smoking status: Former    Types: Cigarettes   Smokeless tobacco: Never   Tobacco comments:    05-11-2024  pt stated quit smoking 2023,  started age 65  (28)  for 30 yrs  Vaping Use   Vaping status: Never Used  Substance and Sexual Activity   Alcohol use: Not Currently   Drug use: Never   Sexual activity: Yes    Partners: Male    Birth control/protection: Surgical    Comment: 1st intercourse 66 yo-5 partners, hysterectomy  Other Topics Concern   Not on file  Social History Narrative   Pt is right handed   Lives in single story home with her husband, mother and uncle   Has associated degree   Last employment: [employer]  /  Currently on disability.    Social Drivers of Corporate Investment Banker Strain: Low Risk  (05/10/2024)   Overall Financial Resource Strain (CARDIA)    Difficulty of Paying Living Expenses: Not hard at all  Food Insecurity: No Food Insecurity (05/10/2024)   Hunger Vital Sign    Worried About Running Out of Food in the Last Year: Never true    Ran Out of Food in the Last Year: Never true  Transportation Needs: No Transportation Needs (05/10/2024)   PRAPARE - Administrator, Civil Service (Medical): No    Lack of Transportation (Non-Medical): No  Physical Activity: Unknown (05/10/2024)   Exercise Vital Sign    Days of Exercise  per Week: 0 days    Minutes of Exercise per Session: Not on file  Stress: Stress Concern Present (05/10/2024)   [employer] of Occupational Health - Occupational Stress Questionnaire    Feeling of Stress : Rather much  Social Connections: Moderately Isolated (05/10/2024)   Social Connection and Isolation Panel    Frequency of Communication with Friends and Family: Twice a week    Frequency of Social Gatherings with Friends and Family: Never    Attends Religious Services: 1 to 4 times per year    Active Member of [employer] or Organizations: No    Attends Banker Meetings: Not on file    Marital Status: Married  Catering Manager Violence: Not At Risk (03/09/2023)   Humiliation, Afraid, Rape, and Kick questionnaire    Fear of Current or Ex-Partner: No    Emotionally Abused: No    Physically Abused: No    Sexually Abused: No    Past Medical History, Surgical history, Social history, and Family history were reviewed and updated as appropriate.   Please see review of systems for further details on the patient's review from today.   Objective:   Physical Exam:  LMP 05/11/2024 (Exact Date)   Physical Exam Constitutional:      General: She is not in acute distress. Musculoskeletal:        General: No deformity.  Neurological:     Mental Status: She is alert and oriented to person, place, and time.     Coordination: Coordination normal.  Psychiatric:        Attention and Perception: Attention and perception normal. She does not perceive auditory or visual hallucinations.        Mood and Affect: Mood is anxious. Mood is not depressed or elated. Affect is not labile, angry, tearful or inappropriate.        Speech: Speech normal. Speech is not rapid and pressured.        Behavior: Behavior normal. Behavior is not agitated, hyperactive or combative.        Thought Content: Thought content is not paranoid or delusional. Thought content does not include homicidal or suicidal  ideation. Thought content does not include suicidal plan.        Cognition and Memory: Cognition and memory normal.        Judgment: Judgment normal.     Comments: Insight intact No AIM  manic worse      Lab Review:     Component Value Date/Time   NA 134 (L) 05/16/2024 1000   NA 137 04/21/2022 1049   K 4.0 05/16/2024 1000   CL 102 05/16/2024 1000   CO2 23 05/16/2024 1000   GLUCOSE 102 (H) 05/16/2024 1000   BUN 11 05/16/2024 1000   BUN 10 04/21/2022  1049   CREATININE 0.91 05/16/2024 1000   CREATININE 0.67 09/18/2021 1125   CALCIUM 9.1 05/16/2024 1000   PROT 6.5 04/21/2022 1049   ALBUMIN 4.6 04/21/2022 1049   AST 19 04/21/2022 1049   ALT 10 04/21/2022 1049   ALKPHOS 78 04/21/2022 1049   BILITOT 0.4 04/21/2022 1049   GFRNONAA >60 05/16/2024 1000       Component Value Date/Time   WBC 6.1 05/16/2024 1000   RBC 4.71 05/16/2024 1000   HGB 14.7 05/16/2024 1000   HGB 14.5 04/21/2022 1049   HCT 43.2 05/16/2024 1000   HCT 42.6 04/21/2022 1049   PLT 338 05/16/2024 1000   PLT 338 04/21/2022 1049   MCV 91.7 05/16/2024 1000   MCV 92 04/21/2022 1049   MCH 31.2 05/16/2024 1000   MCHC 34.0 05/16/2024 1000   RDW 12.1 05/16/2024 1000   RDW 12.2 04/21/2022 1049   LYMPHSABS 1,995 09/18/2021 1125   MONOABS 570 03/20/2017 1100   EOSABS 251 09/18/2021 1125   BASOSABS 91 09/18/2021 1125    No results found for: POCLITH, LITHIUM   No results found for: PHENYTOIN, PHENOBARB, VALPROATE, CBMZ   .res Assessment: Plan:    Shey was seen today for follow-up, manic behavior and anxiety.  Diagnoses and all orders for this visit:  Bipolar I disorder, most recent episode (or current) manic (HCC) -     QUEtiapine  (SEROQUEL  XR) 300 MG 24 hr tablet; 1 nightly for 3 nights then 2 nightly  PTSD (post-traumatic stress disorder)  Nightmares associated with chronic post-traumatic stress disorder  Generalized anxiety disorder  RLS (restless legs syndrome)  Insomnia due to  other mental disorder      30 min face to face time. We discussed the following: Yitty has treatment resistant bipolar disorder with psychotic features versus schizoaffective disorder and other psychiatric diagnoses as well.  She has been very difficult to treat in part because she is very sensitive to antipsychotics and yet has strong history of chronic auditory hallucinations.    She is chronically anxious and depressed.    Chronic mood cycling is typical but worse.   Extensive history of psychiatric meds tried discussed with the patient.   Lately mainly manic and failed Latuda  160 mg .    TR insomnia chronically.  But less awakening at present discussed Ambien  amnesia and side effects. No change in sleep meds this visit RLS managed right now. RLS managed and neuro checked iron studies reportedly ok.  Clozapine option.        No  Caffeine after noon.  This is helped.   continue Gabapentin  for off label anxiety and RLS at previous dosage.  400 BID and 800 pm.     Discussed the risk of polypharmacy.  She is on multiple medications.  Disc purpose of each of the meds and consider changing some of them.  Undesirable polypharmacy but necessary bc chronic psych instability with history severe sx.she is benefitting and tolerating meds.  Counseled patient regarding potential benefits, risks, and side effects of Lamictal  to include potential risk of Stevens-Johnson syndrome. Advised patient to stop taking Lamictal  and contact office immediately if rash develops and to seek urgent medical attention if rash is severe and/or spreading quickly.  Lamictal  150  Continue cyclobenzaprine  20 mg nightly for RLS Continue doxazosin  6 mg nightly For RLS gabapentin  400 mg twice daily and 1200 mg nightly ropinirole  2 mg at 3 PM and 8 mg nightly. High dose necessary to control RLS Continue lorazepam  1  mg BID  and 2 tabs HS  After mania controlled wean clonidine  if it is not helping any sx  For mania:   Start quetiapine  ER 300 mg tablet 1 at night for 2-3 nights then increase to 2 of the 300 mg tablets nightly Reduce Latuda  to 80 mg daily for 4 days then reduce 1/2 tablet for 4 days then stop it.  Continue Lunesta  3 for longer duration vs Ambien .  Discussed side effects including amnesia.  Discussed the very high dose of recurrent ropinirole  and possible psychiatric side effects with that.  Might be causing mania but had rapid cycling before it.  However required to manage RLS  Need counseling and started Darren Livingston at [facility]  . Previously Discussed safety plan at length with patient.  Advised patient to contact office with any worsening signs and symptoms.  Instructed patient to go to the [facility] emergency room for evaluation if experiencing any acute safety concerns, to include suicidal intent.  Discussed potential metabolic side effects associated with atypical antipsychotics, as well as potential risk for movement side effects. Advised pt to contact office if movement side effects occur.   She agrees with the plan  Rec talk with PCP about IV iron for RLS.  Gave copy of article outlining Am Acad of Sleep Med recommendations.   disc iron treatment recommended now for this which might allow weaning ropinirole .  Follow-up 2 weeks bc mania  Lorene Macintosh MD, DFAPA Please see After Visit Summary for patient specific instructions.  Future Appointments  Date Time Provider Department Center  10/27/2024  1:20 PM Ngetich, Roxan BROCKS, NP [facility] [address]       No orders of the defined types were placed in this encounter.    -------------------------------

## 2024-10-27 ENCOUNTER — Ambulatory Visit: Admitting: Family

## 2024-10-27 ENCOUNTER — Encounter

## 2024-10-27 ENCOUNTER — Telehealth: Payer: Self-pay | Admitting: Psychiatry

## 2024-10-27 NOTE — Telephone Encounter (Signed)
 Patient lvm at 11:44 stating that last week she was prescribed Seroquel  300mg . She is not tolerating it well was able tp get up to two tablets at night for two nights it terrible. She is very tired and lethargic in the morning and bumping into things. She would like to go back to Latuda  or try something else. PH: [phone] Appt 11/21

## 2024-10-28 NOTE — Telephone Encounter (Signed)
 I evidently started the SERoquel  too high.  Those SE occur if you start to high.  She could try again and go slower like 1/2 tablet and increase by 1/2 tablet every week or so to 2 daily.  The Latuda  didn't work that's why we are removing it.  We are running out of options.  We're going to have to try something she didn't tolerate before and something with low risk of RLS.  So best option would be Depakote  which caused nausea.  There is another form of it called ER with low N risk.  We can start it low and go up slowly.  This would be in place of Latuda . Start Depakote  ER 250 mg 1 at night for 4 nights, then 2 at night for 4 nights, then 3 at night.

## 2024-10-28 NOTE — Telephone Encounter (Signed)
 LVM to [facility]

## 2024-10-28 NOTE — Telephone Encounter (Signed)
 Please see note from patient. She reports she stopped the Seroquel  last night. Wants Latuda  or something else.

## 2024-11-01 MED ORDER — DIVALPROEX SODIUM ER 250 MG PO TB24: ORAL_TABLET | ORAL | 0 refills | Status: DC

## 2024-11-01 NOTE — Telephone Encounter (Signed)
 Reviewed recommendations for Seroquel  and Depakote  with her. The Rx for Depakote  has been sent.

## 2024-11-04 ENCOUNTER — Ambulatory Visit: Admitting: Psychiatry

## 2024-11-04 ENCOUNTER — Encounter: Payer: Self-pay | Admitting: Psychiatry

## 2024-11-04 DIAGNOSIS — F4312 Post-traumatic stress disorder, chronic: Secondary | ICD-10-CM

## 2024-11-04 DIAGNOSIS — G2581 Restless legs syndrome: Secondary | ICD-10-CM

## 2024-11-04 DIAGNOSIS — F515 Nightmare disorder: Secondary | ICD-10-CM

## 2024-11-04 DIAGNOSIS — F99 Mental disorder, not otherwise specified: Secondary | ICD-10-CM

## 2024-11-04 DIAGNOSIS — F4310 Post-traumatic stress disorder, unspecified: Secondary | ICD-10-CM | POA: Diagnosis not present

## 2024-11-04 DIAGNOSIS — F5105 Insomnia due to other mental disorder: Secondary | ICD-10-CM | POA: Diagnosis not present

## 2024-11-04 DIAGNOSIS — F411 Generalized anxiety disorder: Secondary | ICD-10-CM | POA: Diagnosis not present

## 2024-11-04 DIAGNOSIS — F3110 Bipolar disorder, current episode manic without psychotic features, unspecified: Secondary | ICD-10-CM

## 2024-11-04 MED ORDER — DIVALPROEX SODIUM ER 500 MG PO TB24: 1000.0000 mg | ORAL_TABLET | Freq: Every day | ORAL | 0 refills | Status: DC

## 2024-11-04 NOTE — Patient Instructions (Signed)
 Continue Latuda  80 mg  Increase Depakote  to 2 of the 250 mg tablets for 2 days, then 3 tablets daily for 2 days, then 4 tablets =1000 mg daily.  Once mania is controlled , then reduce Latuda  to 1/2 of the 80 mg daily.

## 2024-11-04 NOTE — Progress Notes (Signed)
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 not want to go to see another doctor about this at this time.  Given that situation it was suggested that she try reducing the evening dose of ropinirole  to 6 mg from 8 mg given that her restless legs is only a problem in the evening before she goes to bed and not after.   M bipolar on Latuda  60 & lamotrigine   Review of Systems:  /Review of Systems  Cardiovascular:  Negative for palpitations.  Gastrointestinal:  Negative for nausea.  Neurological:  Negative for tremors.       RLS  Psychiatric/Behavioral:  Positive for agitation and sleep disturbance. Negative for behavioral problems, confusion, dysphoric mood and suicidal ideas. The patient is nervous/anxious. The patient is not hyperactive.     Medications: I have reviewed the patient's current medications.  Current Outpatient Medications  Medication Sig Dispense Refill   acetaminophen  (TYLENOL ) 500 MG tablet Take 1,000 mg by mouth every 6 (six) hours as needed.     albuterol  (VENTOLIN  HFA) 108 (90 Base) MCG/ACT inhaler Inhale 2 puffs into the lungs every 6 (six) hours as needed for wheezing or shortness of breath. 8.5 g 3   BREZTRI  AEROSPHERE 160-9-4.8 MCG/ACT AERO inhaler INHALE 2 PUFFS INTO THE LUNGS TWICE DAILY (FIRST THING IN THE MORNING AND ABOUT 12 HOURS LATER) 3 each 3   cloNIDine  (CATAPRES ) 0.1 MG tablet TAKE 2 TABLETS TWICE  DAILY 360 tablet 0   cyclobenzaprine  (FLEXERIL ) 10 MG tablet TAKE 2 TABLETS AT BEDTIME 180 tablet 0   divalproex  (DEPAKOTE   ER) 500 MG 24 hr tablet Take 2 tablets (1,000 mg total) by mouth daily. 180 tablet 0   docusate sodium  (COLACE) 100 MG capsule Take 1 capsule (100 mg total) by mouth 2 (two) times daily. 10 capsule 0   estradiol  (DOTTI ) 0.0375 MG/24HR Place 1 patch onto the skin 2 (two) times a week. 8 patch 12   estradiol  (ESTRACE  VAGINAL) 0.1 MG/GM vaginal cream Rub pea size amount each night for 3 weeks then 3 times a week thereafter. 42.5 g 12   Eszopiclone  3 MG TABS TAKE 1 TABLET BY MOUTH ONCE NIGHTLY IMMEDIATELY BEFORE BEDTIME 30 tablet 1   fluticasone  (FLONASE ) 50 MCG/ACT nasal spray Place 2 sprays into both nostrils daily. 16 g 6   gabapentin  (NEURONTIN ) 400 MG capsule TAKE 1 CAPSULE TWICE DAILY AND TAKE 3 CAPSULES IN THE EVENING 450 capsule 3   ibuprofen  (ADVIL ) 200 MG tablet Take 800 mg by mouth every 6 (six) hours as needed.     ibuprofen  (ADVIL ) 800 MG tablet Take 1 tablet (800 mg total) by mouth every 8 (eight) hours as needed. 30 tablet 1   lamoTRIgine  (LAMICTAL ) 150 MG tablet Take 1 tablet (150 mg total) by mouth daily. 90 tablet 1   LORazepam  (ATIVAN ) 1 MG tablet TAKE 1 TABLET BY MOUTH 2 TIMES A DAY AND 2 TABLETS AT NIGHT 120 tablet 1   lurasidone  (LATUDA ) 80 MG TABS tablet TAKE 1 TABLET TWICE DAILY 180 tablet 0   metroNIDAZOLE  (FLAGYL ) 500 MG tablet Take 1 tablet (500 mg total) by mouth 2 (two) times daily. 14 tablet 0   Multiple Vitamin (MULTIVITAMIN) tablet Take 1 tablet by mouth daily.     Naphazoline HCl (CLEAR EYES OP) Apply to eye as needed.     progesterone  (PROMETRIUM ) 100 MG capsule Take 1 capsule (100 mg total) by mouth at bedtime. 90 capsule 4   Psyllium (METAMUCIL PO) Take by mouth.     rOPINIRole  (REQUIP ) 2 MG tablet TAKE 1 TABLET AT 3PM 90 tablet 0   rOPINIRole  (REQUIP ) 4 MG tablet TAKE 2 TABLETS AT BEDTIME 180 tablet 0   Sodium Bicarbonate -Citric Acid  (ALKA-SELTZER PO) Take by mouth as needed (heartburn/ regurg).     valACYclovir  (VALTREX ) 500 MG tablet Take one tablet by mouth twice daily for 3-5 days then daily as needed. 90 tablet 1   polyethylene glycol (MIRALAX ) 17 g packet Take 17 g by mouth daily. (Patient not taking: Reported on 11/04/2024) 14 each 0   Prasterone  (INTRAROSA ) 6.5 MG INST Place 1 suppository vaginally at bedtime as needed. (Patient not taking: Reported on 11/04/2024) 30 each 12   QUEtiapine  (SEROQUEL  XR) 300 MG 24 hr tablet 1 nightly for 3 nights then 2 nightly (Patient not taking: Reported on 11/04/2024) 60 tablet 0   No current facility-administered medications for this visit.    Medication Side Effects: nausea  Allergies: No Known Allergies  Past Medical History:  Diagnosis Date   Bipolar disorder with moderate depression (HCC)    psychiastry--- dr c. cottle   COPD (chronic obstructive pulmonary disease) (HCC)    (05-11-2024  pt denies sob w/ any actiivies )  pulmonary--- dr wert;   COPD likely GOLD 2/ AB, mild;  dyspnea w/ household chores   Cough variant asthma    Fracture    h/o ankle fracture   GAD (generalized anxiety disorder)    GERD (gastroesophageal reflux disease)    History of adenomatous polyp of colon    HSV (herpes simplex virus) anogenital  infection 05/2018   Insomnia    Nightmares associated with chronic post-traumatic stress disorder    PTSD (post-traumatic stress disorder)    RLS (restless legs syndrome)    Seasonal allergic rhinitis    Wears glasses     Family History  Problem Relation Age of Onset   Thyroid  disease Mother    COPD Mother    Bipolar disorder Mother    Anxiety disorder Mother    Depression Mother    Parkinson's disease Father    Bipolar disorder Sister    Breast cancer Paternal Grandmother 41   Schizophrenia Other    Colon cancer Neg Hx    Colon polyps Neg Hx    Esophageal cancer Neg Hx    Rectal cancer Neg Hx    Stomach cancer Neg Hx     Social History    Socioeconomic History   Marital status: Married    Spouse name: [name],[name]   Number of children: 0   Years of education: 16   Highest education level: Associate degree: occupational, scientist, product/process development, or vocational program  Occupational History   Occupation: unemployed   Occupation: disabled  Tobacco Use   Smoking status: Former    Types: Cigarettes   Smokeless tobacco: Never   Tobacco comments:    05-11-2024  pt stated quit smoking 2023,  started age 18  (41)  for 66 yrs  Vaping Use   Vaping status: Never Used  Substance and Sexual Activity   Alcohol use: Not Currently   Drug use: Never   Sexual activity: Yes    Partners: Male    Birth control/protection: Surgical    Comment: 1st intercourse 59 yo-5 partners, hysterectomy  Other Topics Concern   Not on file  Social History Narrative   Pt is right handed   Lives in single story home with her husband, mother and uncle   Has associated degree   Last employment: [employer]  /  Currently on disability.    Social Drivers of Corporate Investment Banker Strain: Low Risk  (10/27/2024)   Overall Financial Resource Strain (CARDIA)    Difficulty of Paying Living Expenses: Not hard at all  Food Insecurity: No Food Insecurity (10/27/2024)   Hunger Vital Sign    Worried About Running Out of Food in the Last Year: Never true    Ran Out of Food in the Last Year: Never true  Transportation Needs: Unknown (10/27/2024)   PRAPARE - Administrator, Civil Service (Medical): Patient declined    Lack of Transportation (Non-Medical): No  Physical Activity: Unknown (10/27/2024)   Exercise Vital Sign    Days of Exercise per Week: Patient declined    Minutes of Exercise per Session: Not on file  Stress: Stress Concern Present (10/27/2024)   [employer] of Occupational Health - Occupational Stress Questionnaire    Feeling of Stress: Very much  Social Connections: Socially Integrated (10/27/2024)   Social Connection and Isolation  Panel    Frequency of Communication with Friends and Family: More than three times a week    Frequency of Social Gatherings with Friends and Family: Patient declined    Attends Religious Services: More than 4 times per year    Active Member of [employer] or Organizations: Yes    Attends Banker Meetings: More than 4 times per year    Marital Status: Married  Catering Manager Violence: Not At Risk (03/09/2023)   Humiliation, Afraid, Rape, and Kick questionnaire    Fear of  Current or Ex-Partner: No    Emotionally Abused: No    Physically Abused: No    Sexually Abused: No    Past Medical History, Surgical history, Social history, and Family history were reviewed and updated as appropriate.   Please see review of systems for further details on the patient's review from today.   Objective:   Physical Exam:  LMP 05/11/2024 (Exact Date)   Physical Exam Constitutional:      General: She is not in acute distress. Musculoskeletal:        General: No deformity.  Neurological:     Mental Status: She is alert and oriented to person, place, and time.     Coordination: Coordination normal.  Psychiatric:        Attention and Perception: Attention and perception normal. She does not perceive auditory or visual hallucinations.        Mood and Affect: Mood is anxious. Mood is not depressed or elated. Affect is not labile, angry, tearful or inappropriate.        Speech: Speech normal. Speech is not rapid and pressured.        Behavior: Behavior normal. Behavior is not agitated, hyperactive or combative.        Thought Content: Thought content is not paranoid or delusional. Thought content does not include homicidal or suicidal ideation. Thought content does not include suicidal plan.        Cognition and Memory: Cognition and memory normal.        Judgment: Judgment normal.     Comments: Insight intact No AIM  manic ongoing      Lab Review:     Component Value Date/Time   NA 134  (L) 05/16/2024 1000   NA 137 04/21/2022 1049   K 4.0 05/16/2024 1000   CL 102 05/16/2024 1000   CO2 23 05/16/2024 1000   GLUCOSE 102 (H) 05/16/2024 1000   BUN 11 05/16/2024 1000   BUN 10 04/21/2022 1049   CREATININE 0.91 05/16/2024 1000   CREATININE 0.67 09/18/2021 1125   CALCIUM 9.1 05/16/2024 1000   PROT 6.5 04/21/2022 1049   ALBUMIN 4.6 04/21/2022 1049   AST 19 04/21/2022 1049   ALT 10 04/21/2022 1049   ALKPHOS 78 04/21/2022 1049   BILITOT 0.4 04/21/2022 1049   GFRNONAA >60 05/16/2024 1000       Component Value Date/Time   WBC 6.1 05/16/2024 1000   RBC 4.71 05/16/2024 1000   HGB 14.7 05/16/2024 1000   HGB 14.5 04/21/2022 1049   HCT 43.2 05/16/2024 1000   HCT 42.6 04/21/2022 1049   PLT 338 05/16/2024 1000   PLT 338 04/21/2022 1049   MCV 91.7 05/16/2024 1000   MCV 92 04/21/2022 1049   MCH 31.2 05/16/2024 1000   MCHC 34.0 05/16/2024 1000   RDW 12.1 05/16/2024 1000   RDW 12.2 04/21/2022 1049   LYMPHSABS 1,995 09/18/2021 1125   MONOABS 570 03/20/2017 1100   EOSABS 251 09/18/2021 1125   BASOSABS 91 09/18/2021 1125    No results found for: POCLITH, LITHIUM   No results found for: PHENYTOIN, PHENOBARB, VALPROATE, CBMZ   .res Assessment: Plan:    Beth [name] was seen today for follow-up, depression, anxiety and manic behavior.  Diagnoses and all orders for this visit:  Bipolar I disorder, most recent episode (or current) manic (HCC) -     divalproex  (DEPAKOTE  ER) 500 MG 24 hr tablet; Take 2 tablets (1,000 mg total) by mouth daily.  PTSD (post-traumatic stress disorder)  Generalized anxiety disorder  RLS (restless legs syndrome)  Nightmares associated with chronic post-traumatic stress disorder  Insomnia due to other mental disorder    30 min face to face time. We discussed the following: Beth [name] has treatment resistant bipolar disorder with psychotic features versus schizoaffective disorder and other psychiatric diagnoses as well.  She has been very  difficult to treat in part because she is very sensitive to antipsychotics and yet has strong history of chronic auditory hallucinations.    She is chronically anxious and depressed.    Chronic mood cycling is typical but worse.   Extensive history of psychiatric meds tried discussed with the patient.   Lately mainly manic and failed Latuda  160 mg .    TR insomnia chronically.  But less awakening at present discussed Ambien  amnesia and side effects. No change in sleep meds this visit RLS managed right now. RLS managed and neuro checked iron studies reportedly ok.  Clozapine option.        No  Caffeine after noon.  This is helped.   continue Gabapentin  for off label anxiety and RLS at previous dosage.  400 BID and 800 pm.     Discussed the risk of polypharmacy.  She is on multiple medications.  Disc purpose of each of the meds and consider changing some of them.  Undesirable polypharmacy but necessary bc chronic psych instability with history severe sx.she is benefitting and tolerating meds.  Counseled patient regarding potential benefits, risks, and side effects of Lamictal  to include potential risk of Stevens-Johnson syndrome. Advised patient to stop taking Lamictal  and contact office immediately if rash develops and to seek urgent medical attention if rash is severe and/or spreading quickly.  Lamictal  150  Continue cyclobenzaprine  20 mg nightly for RLS Continue doxazosin  6 mg nightly For RLS gabapentin  400 mg twice daily and 1200 mg nightly ropinirole  2 mg at 3 PM and 8 mg nightly. High dose necessary to control RLS Continue lorazepam  1 mg BID  and 2 tabs HS  After mania controlled wean clonidine  if it is not helping any sx  For mania:  Continue Latuda  80 mg  Increase Depakote  to 2 of the 250 mg tablets for 2 days, then 3 tablets daily for 2 days, then 4 tablets =1000 mg daily.  Once mania is controlled , then reduce Latuda  to 1/2 of the 80 mg daily.  Continue Lunesta  3 for longer  duration vs Ambien .  Discussed side effects including amnesia.  Discussed the very high dose of recurrent ropinirole  and possible psychiatric side effects with that.  Might be causing mania but had rapid cycling before it.  However required to manage RLS  Need counseling and started Darren Livingston at [facility].  . Previously Discussed safety plan at length with patient.  Advised patient to contact office with any worsening signs and symptoms.  Instructed patient to go to the [facility] emergency room for evaluation if experiencing any acute safety concerns, to include suicidal intent.  Discussed potential metabolic side effects associated with atypical antipsychotics, as well as potential risk for movement side effects. Advised pt to contact office if movement side effects occur.   She agrees with the plan  Rec talk with PCP about IV iron for RLS.  Gave copy of article outlining Am Acad of Sleep Med recommendations.   disc iron treatment recommended now for this which might allow weaning ropinirole .  Follow-up 4 weeks bc mania.  Call if sx worsen  Lorene Macintosh MD, DFAPA Please  see After Visit Summary for patient specific instructions.  Future Appointments  Date Time Provider Department Center  11/14/2024  9:10 AM Adele Song, MD [facility]  12/13/2024  4:30 PM Cottle, Lorene KANDICE Raddle., MD [facility] None       No orders of the defined types were placed in this encounter.    -------------------------------

## 2024-11-07 ENCOUNTER — Other Ambulatory Visit: Payer: Self-pay | Admitting: Psychiatry

## 2024-11-07 DIAGNOSIS — F411 Generalized anxiety disorder: Secondary | ICD-10-CM

## 2024-11-07 DIAGNOSIS — F5105 Insomnia due to other mental disorder: Secondary | ICD-10-CM

## 2024-11-07 DIAGNOSIS — F4310 Post-traumatic stress disorder, unspecified: Secondary | ICD-10-CM

## 2024-11-07 NOTE — Telephone Encounter (Signed)
 Next apt 12/30

## 2024-11-08 ENCOUNTER — Other Ambulatory Visit: Payer: Self-pay | Admitting: Psychiatry

## 2024-11-08 DIAGNOSIS — J22 Unspecified acute lower respiratory infection: Secondary | ICD-10-CM | POA: Diagnosis not present

## 2024-11-08 DIAGNOSIS — F3110 Bipolar disorder, current episode manic without psychotic features, unspecified: Secondary | ICD-10-CM

## 2024-11-09 ENCOUNTER — Encounter: Admitting: Family Medicine

## 2024-11-09 ENCOUNTER — Ambulatory Visit (HOSPITAL_COMMUNITY)

## 2024-11-09 ENCOUNTER — Other Ambulatory Visit: Payer: Self-pay | Admitting: Psychiatry

## 2024-11-09 NOTE — Telephone Encounter (Signed)
 After mania controlled wean clonidine  if it is not helping any sx   ? Still taking.

## 2024-11-10 ENCOUNTER — Other Ambulatory Visit: Payer: Self-pay | Admitting: Psychiatry

## 2024-11-14 ENCOUNTER — Ambulatory Visit: Admitting: Family Medicine

## 2024-11-14 VITALS — BP 128/82 | HR 108 | Wt 199.4 lb

## 2024-11-14 DIAGNOSIS — J45991 Cough variant asthma: Secondary | ICD-10-CM

## 2024-11-14 DIAGNOSIS — Z23 Encounter for immunization: Secondary | ICD-10-CM

## 2024-11-14 DIAGNOSIS — J069 Acute upper respiratory infection, unspecified: Secondary | ICD-10-CM

## 2024-11-14 NOTE — Patient Instructions (Addendum)
 Thank you for coming in today! Here is a summary of what we discussed:  Sounds like your symptoms are most likely due to a viral respiratory infection.  You can use ibuprofen  or Tylenol  as needed for symptom relief.  Please be sure to stay well hydrated.  Warm fluids with honey can help with sore throat. Please let us  know if your symptoms do not improve or if they worsen in the next few days.   You can take the nebulizer order to any DME (durable medical equipment) store. We can also send the prescription to one of the suppliers if you'd like.  Please schedule another appt for your physical at the front desk  Please call the clinic at [phone] if your symptoms worsen or you have any concerns.  Best, Dr Adele

## 2024-11-14 NOTE — Progress Notes (Signed)
    SUBJECTIVE:   CHIEF COMPLAINT / HPI:   URI --1 week of symptoms --husband had similar sx recently --no fevers --congestion, cough --no N/V/D --duoneb nebulizer helps, has been using her mom's nebulizer machine but would like her own --got a Zpack from urgent care last week, it did not seem to help --sx have been consistent, no initial improvement followed by worsening  Would like flu and COVID vaccines today  Will schedule follow up for physical exam   PERTINENT  PMH / PSH: Reviewed  OBJECTIVE:   BP 128/82   Pulse (!) 108   Wt 199 lb 6.4 oz (90.4 kg)   LMP 05/11/2024 (Exact Date)   SpO2 96%   BMI 31.23 kg/m   General: Awake and conversant, no acute distress HEENT: MMM, no rhinorrhea, no oropharyngeal erythema or tonsillar hypertrophy CV: RRR, normal S1/S2, no M/R/G Pulm: CTAB, mildly shallow breathing on RA, mild inspiratory wheezing, no crackles or rales, occasional productive cough Neuro: No focal deficits Psych: Appropriate mood and affect   ASSESSMENT/PLAN:   Assessment & Plan Viral upper respiratory tract infection Sx likely due to a viral URI given timeline and overall mild presentation.  No concern for pneumonia at this time.  Advised patient to treat symptomatically and follow-up if symptoms worsen or do not improve in the next several days. Cough variant asthma Requests nebulizer for home use- has rx for DuoNeb already.  It is medically appropriate for patient to have a nebulizer to use for asthma flares. - For home use only DME Other see comment     Rea Raring, MD [facility]

## 2024-11-14 NOTE — Assessment & Plan Note (Signed)
 Requests nebulizer for home use- has rx for DuoNeb already.  It is medically appropriate for patient to have a nebulizer to use for asthma flares. - For home use only DME Other see comment

## 2024-11-14 NOTE — Progress Notes (Deleted)
    SUBJECTIVE:   Chief compliant/HPI: annual examination  Beth [name] is a 48 y.o. who presents today for an annual exam.   History tabs reviewed and updated ***.   Review of systems form reviewed and notable for ***.   OBJECTIVE:   LMP 05/11/2024 (Exact Date)   ***  ASSESSMENT/PLAN:   Assessment & Plan  Annual Examination  See AVS for age appropriate recommendations.   PHQ score ***, reviewed and discussed.  Blood pressure reviewed and at goal ***.  Asked about intimate partner violence and resources given as appropriate  The patient currently uses *** for contraception. Folate recommended as appropriate, minimum of 400 mcg per day.   Considered the following items based upon USPSTF recommendations: Diabetes screening: {FMCANNUALORDERED:33692} HIV testing:{FMCANNUALORDERED:33692} NR 2023 Hepatitis C: {FMCANNUALORDERED:33692} NR 2023 Hepatitis B:{FMCANNUALORDERED:33692} Syphilis if at high risk: {FMCANNUALORDERED:33692} GC/CT {GC/CT screening :23818} Lipid panel (nonfasting or fasting) discussed based upon AHA recommendations and last checked 2023- TC 215 and LDL 135 {FMCLIPID:33694}.  Consider repeat every 4-6 years.  Reviewed risk factors for latent tuberculosis and {not indicated/requested/declined:14582}   Discussed family history, BRCA testing {not indicated/requested/declined:14582}. Tool used to risk stratify was ***.  Cervical cancer screening: not indicated given history of hysterectomy with prior normal cytology.  Breast cancer screening: recently completed and repeat not yet indicated due Oct 2026 Colorectal cancer screening: up to date on screening for CRC. if age 74 or over. Due 2030  Follow up in 1 *** year or sooner if indicated.  MyChart Activation: Already signed up  Rea Raring, MD [facility]

## 2024-11-15 ENCOUNTER — Telehealth: Payer: Self-pay

## 2024-11-15 ENCOUNTER — Encounter: Payer: Self-pay | Admitting: Family Medicine

## 2024-11-15 NOTE — Telephone Encounter (Signed)
 Patient sent mychart message regarding Nebulizer.   Community message sent to [employer]. Will await response.   Chiquita JAYSON English, RN

## 2024-11-15 NOTE — Telephone Encounter (Signed)
 Receipt confirmed by [employer].   Chiquita JAYSON English, RN

## 2024-11-16 ENCOUNTER — Other Ambulatory Visit: Payer: Self-pay | Admitting: Psychiatry

## 2024-11-16 DIAGNOSIS — F3110 Bipolar disorder, current episode manic without psychotic features, unspecified: Secondary | ICD-10-CM

## 2024-11-16 NOTE — Telephone Encounter (Signed)
 Call to verify if taking or not.

## 2024-11-17 NOTE — Telephone Encounter (Signed)
 Confirmed with patient that she is no longer taking.

## 2024-11-21 ENCOUNTER — Telehealth: Payer: Self-pay | Admitting: Psychiatry

## 2024-11-21 NOTE — Telephone Encounter (Signed)
 Beth [name] lvm that the depakote  makes her feel like a zombie. She also said that her mother is bi polar and that her mom takes lunesta  and vistiril for sleep and her mom sleeps through the whole night. Please call [name] at [phone]

## 2024-11-22 NOTE — Telephone Encounter (Signed)
 Pt reports the increased dose of Depakote  makes her feels like a zombie. She decreased dose back down, reports taking 500 mg for about a week. She said her mother is on Lunesta  and vistiril. Patient has been on Lunesta  and would like to add vistaril  to help with sleep.

## 2024-11-22 NOTE — Telephone Encounter (Signed)
 11/21 visit:  Continue Latuda  80 mg  Increase Depakote  to 2 of the 250 mg tablets for 2 days, then 3 tablets daily for 2 days, then 4 tablets =1000 mg daily.   Once mania is controlled , then reduce Latuda  to 1/2 of the 80 mg daily.  LVM to RC.

## 2024-11-29 ENCOUNTER — Other Ambulatory Visit: Payer: Self-pay | Admitting: Psychiatry

## 2024-11-29 ENCOUNTER — Other Ambulatory Visit: Payer: Self-pay | Admitting: Obstetrics and Gynecology

## 2024-11-29 DIAGNOSIS — Z1231 Encounter for screening mammogram for malignant neoplasm of breast: Secondary | ICD-10-CM

## 2024-11-29 MED ORDER — HYDROXYZINE PAMOATE 25 MG PO CAPS: 25.0000 mg | ORAL_CAPSULE | Freq: Every evening | ORAL | 0 refills | Status: DC

## 2024-11-29 NOTE — Telephone Encounter (Signed)
 It is unlikely that Vistaril  is going to work for insomnia bc it is likely the mania causing the insomnia and she is not taking enough Depakote  to manage the mania.  But I will give her a couple of weeks of vistaril  to last until appt to see if it helps.  She can take it with Lunesta  (which she has) as long as it is tolerated.

## 2024-11-30 NOTE — Telephone Encounter (Signed)
Recommendations reviewed with patient.

## 2024-12-05 ENCOUNTER — Telehealth: Payer: Self-pay | Admitting: Psychiatry

## 2024-12-05 NOTE — Telephone Encounter (Addendum)
 Pt reporting she has decreased Depakote  to 500 mg d/t weight gain. Told her it was prescribed for her mania and she reports not having any recent mania. She also reports that she goes to bed at 8 and taking the Vistaril  keeps her asleep until about midnight, nothing keeps me sleep.

## 2024-12-05 NOTE — Telephone Encounter (Signed)
 Pt Lvm @ 10:22a. She sounded frantic and talking very loud and fast (maybe manic).  She said she can't take the Depokote any more.  She said I have gained weight and I'm already big enough.  She said she wakes up 3 times every night.  Next appt 12/30

## 2024-12-06 NOTE — Telephone Encounter (Signed)
 She is complicated.  I cannot do anything else until I see her in a week

## 2024-12-13 ENCOUNTER — Encounter: Payer: Self-pay | Admitting: Psychiatry

## 2024-12-13 ENCOUNTER — Ambulatory Visit (INDEPENDENT_AMBULATORY_CARE_PROVIDER_SITE_OTHER): Admitting: Psychiatry

## 2024-12-13 DIAGNOSIS — F5105 Insomnia due to other mental disorder: Secondary | ICD-10-CM

## 2024-12-13 DIAGNOSIS — F4310 Post-traumatic stress disorder, unspecified: Secondary | ICD-10-CM

## 2024-12-13 DIAGNOSIS — G2581 Restless legs syndrome: Secondary | ICD-10-CM | POA: Diagnosis not present

## 2024-12-13 DIAGNOSIS — F515 Nightmare disorder: Secondary | ICD-10-CM

## 2024-12-13 DIAGNOSIS — F411 Generalized anxiety disorder: Secondary | ICD-10-CM | POA: Diagnosis not present

## 2024-12-13 DIAGNOSIS — F3110 Bipolar disorder, current episode manic without psychotic features, unspecified: Secondary | ICD-10-CM

## 2024-12-13 DIAGNOSIS — F99 Mental disorder, not otherwise specified: Secondary | ICD-10-CM | POA: Diagnosis not present

## 2024-12-13 DIAGNOSIS — F4312 Post-traumatic stress disorder, chronic: Secondary | ICD-10-CM | POA: Diagnosis not present

## 2024-12-13 MED ORDER — OXCARBAZEPINE 300 MG PO TABS: ORAL_TABLET | ORAL | 0 refills | Status: DC

## 2024-12-13 MED ORDER — DOXAZOSIN MESYLATE 2 MG PO TABS: 4.0000 mg | ORAL_TABLET | Freq: Every day | ORAL | 0 refills | Status: AC

## 2024-12-13 NOTE — Patient Instructions (Signed)
 Stop Depakote  Increase lamotrigine  to 2 tablets daily Resume doxazosin  1-2 at night for nightmares Oxcarbazepine  300 mg tablet, 1/2 nightly for 3 nights, then 1 nightly for 1 week, then 2 nightly.

## 2024-12-13 NOTE — Progress Notes (Signed)
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 and not bad.   She asks about increasing lamotrigine  and stop VPA bc can't handle the wt gain from it. Horrible dreams of dog that died; wants to resume doxazosin  bc it helped NM. NM waking her.   Problem with depakote  and wt gain.   Prior psychiatric medication trials include  Latuda   Fanapt which cause restless legs,  Loxapine  weight,  Abilfiy 10 NM  Vraylar  Saphris  which caused weight gain & RLS, Liked saphris  some. olanzapine weight gain,  Geodon,  Seroquel  weight gain and restless legs, but worked risperidone restless legs,  Haloperidol  2 RLS,    loxapine  cost and weight,  Latuda  helped mood 80 good resp but then failed to manage mania at 160 mg daily Caplyta lithium wt gain,  lamotrigine ,  Equetro with mildly elevated liver enzymes, Depakote  retrial at [address], but did help mania  Sinemet , Mirapex , ropinirole  gabapentin .  Lyrica weight gain,   Lexapro, duloxetine, venlafaxine, bupropion , fluoxetine, sertraline ,    Wellbutrin  150 mania  Ambien , Restoril lost effect, mirtazapine increased appetite, trazodone restlessness,,doxepin ineffective,   cyclobenzaprine    04/18/20 appt with neuro about RLS.    Iron studies were normal.  The neurologist suggested referral to a movement disorder specialist.  We discussed at length that there are occasions in which going too high with a  dopamine  agonist can actually worsen restless legs rather than improve it.  She is at a high dose of ropinirole .  She does not want to go to see another doctor about this at this time.  Given that situation it was suggested that she try reducing the evening dose of ropinirole  to 6 mg from 8 mg given that her restless legs is only a problem in the evening before she goes to bed and not after.   M bipolar on Latuda  60 & lamotrigine   Review of Systems:  /Review of Systems  Cardiovascular:  Negative for palpitations.  Gastrointestinal:  Negative for nausea.  Neurological:  Negative for tremors.       RLS  Psychiatric/Behavioral:  Positive for agitation and sleep disturbance. Negative for behavioral problems, confusion, dysphoric mood and suicidal ideas. The patient is nervous/anxious. The patient is not hyperactive.     Medications: I have reviewed the patient's current medications.  Current Outpatient Medications  Medication Sig Dispense Refill   acetaminophen  (TYLENOL ) 500 MG tablet Take 1,000 mg by mouth every 6 (six) hours as needed.     albuterol  (VENTOLIN  HFA) 108 (90 Base) MCG/ACT inhaler Inhale 2 puffs into the lungs every 6 (six) hours as needed for wheezing or shortness of breath. 8.5 g 3   BREZTRI  AEROSPHERE 160-9-4.8 MCG/ACT AERO inhaler INHALE 2 PUFFS INTO THE LUNGS TWICE DAILY (FIRST THING IN THE MORNING AND ABOUT 12 HOURS LATER) 3 each 3   cloNIDine  (CATAPRES ) 0.1 MG tablet TAKE 2 TABLETS TWICE DAILY 360 tablet 0   cyclobenzaprine  (FLEXERIL ) 10 MG tablet TAKE 2 TABLETS AT BEDTIME 180 tablet 0   divalproex  (DEPAKOTE  ER) 500 MG 24 hr tablet Take 2 tablets (1,000 mg total) by mouth daily. 180 tablet 0   docusate sodium  (COLACE) 100 MG capsule Take 1 capsule (100 mg total) by mouth 2 (two) times daily. 10 capsule 0   estradiol  (DOTTI ) 0.0375 MG/24HR Place 1 patch onto the skin 2 (two) times a week. 8 patch 12   estradiol  (ESTRACE  VAGINAL) 0.1 MG/GM vaginal cream Rub pea size amount  each night for 3 weeks then 3 times a week thereafter. 42.5 g 12   Eszopiclone  3 MG TABS TAKE 1 TABLET BY MOUTH EVERY NIGHT IMMEDIATELY BEFORE BEDTIME 30 tablet 1   fluticasone  (FLONASE ) 50 MCG/ACT nasal spray Place 2 sprays into both nostrils daily. 16 g 6   gabapentin  (NEURONTIN ) 400 MG capsule TAKE 1 CAPSULE TWICE DAILY AND TAKE 3 CAPSULES IN THE EVENING 450 capsule 3   hydrOXYzine  (VISTARIL ) 25 MG capsule Take 1 capsule (25 mg total) by mouth at bedtime. 15 capsule 0   ibuprofen  (ADVIL ) 200 MG tablet Take 800 mg by mouth every 6 (six) hours as needed.     ibuprofen  (ADVIL ) 800 MG tablet Take 1 tablet (800 mg total) by mouth every 8 (eight) hours as needed. 30 tablet 1   lamoTRIgine  (LAMICTAL ) 150 MG tablet Take 1 tablet (150 mg total) by mouth daily. 90 tablet 1   LORazepam  (ATIVAN ) 1 MG tablet TAKE 1 TABLET BY MOUTH 2 TIMES A DAY AND 2 AT NIGHT 120 tablet 1   lurasidone  (LATUDA ) 80 MG TABS tablet TAKE 1 TABLET TWICE DAILY 180 tablet 0   metroNIDAZOLE  (FLAGYL ) 500 MG tablet Take 1 tablet (500 mg total) by mouth 2 (two) times daily. 14 tablet 0   Multiple Vitamin (MULTIVITAMIN) tablet Take 1 tablet by mouth daily.     Naphazoline HCl (CLEAR EYES OP) Apply to eye as needed.  polyethylene glycol (MIRALAX ) 17 g packet Take 17 g by mouth daily. (Patient not taking: Reported on 11/04/2024) 14 each 0   Prasterone  (INTRAROSA ) 6.5 MG INST Place 1 suppository vaginally at bedtime as needed. (Patient not taking: Reported on 11/04/2024) 30 each 12   progesterone  (PROMETRIUM ) 100 MG capsule Take 1 capsule (100 mg total) by mouth at bedtime. 90 capsule 4   Psyllium (METAMUCIL PO) Take by mouth.     rOPINIRole  (REQUIP ) 2 MG tablet TAKE 1 TABLET AT 3PM 90 tablet 0   rOPINIRole  (REQUIP ) 4 MG tablet TAKE 2 TABLETS AT BEDTIME 180 tablet 0   Sodium Bicarbonate -Citric Acid (ALKA-SELTZER PO) Take by mouth as needed (heartburn/ regurg).     valACYclovir  (VALTREX ) 500 MG tablet Take one tablet by mouth twice  daily for 3-5 days then daily as needed. 90 tablet 1   No current facility-administered medications for this visit.    Medication Side Effects: nausea  Allergies: No Known Allergies  Past Medical History:  Diagnosis Date   Bipolar disorder with moderate depression (HCC)    psychiastry--- dr c. cottle   COPD (chronic obstructive pulmonary disease) (HCC)    (05-11-2024  pt denies sob w/ any actiivies )  pulmonary--- dr darlean;   COPD likely GOLD 2/ AB, mild;  dyspnea w/ household chores   Cough variant asthma    Fracture    h/o ankle fracture   GAD (generalized anxiety disorder)    GERD (gastroesophageal reflux disease)    History of adenomatous polyp of colon    HSV (herpes simplex virus) anogenital infection 05/2018   Insomnia    Nightmares associated with chronic post-traumatic stress disorder    PTSD (post-traumatic stress disorder)    RLS (restless legs syndrome)    Seasonal allergic rhinitis    Wears glasses     Family History  Problem Relation Age of Onset   Thyroid  disease Mother    COPD Mother    Bipolar disorder Mother    Anxiety disorder Mother    Depression Mother    Parkinson's disease Father    Bipolar disorder Sister    Breast cancer Paternal Grandmother 75   Schizophrenia Other    Colon cancer Neg Hx    Colon polyps Neg Hx    Esophageal cancer Neg Hx    Rectal cancer Neg Hx    Stomach cancer Neg Hx     Social History   Socioeconomic History   Marital status: Married    Spouse name: Beth [name],Beth [name]   Number of children: 0   Years of education: 16   Highest education level: Associate degree: occupational, scientist, product/process development, or vocational program  Occupational History   Occupation: unemployed   Occupation: disabled  Tobacco Use   Smoking status: Former    Types: Cigarettes   Smokeless tobacco: Never   Tobacco comments:    05-11-2024  pt stated quit smoking 2023,  started age 45  (73)  for 9 yrs  Vaping Use   Vaping status: Never Used  Substance  and Sexual Activity   Alcohol use: Not Currently   Drug use: Never   Sexual activity: Yes    Partners: Male    Birth control/protection: Surgical    Comment: 1st intercourse 25 yo-5 partners, hysterectomy  Other Topics Concern   Not on file  Social History Narrative   Pt is right handed   Lives in single story home with her husband, mother and uncle   Has associated degree   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    Social Drivers of Health   Tobacco Use: Medium Risk (11/08/2024)   Received from [facility]   Patient History    Smoking Tobacco Use: Former    Smokeless Tobacco Use: Never    Passive Exposure: Not on file  Financial Resource Strain: Low Risk (10/27/2024)   Overall Financial Resource Strain (CARDIA)    Difficulty of Paying Living Expenses: Not hard at all  Food Insecurity: No Food Insecurity (10/27/2024)   Epic    Worried About Programme Researcher, Broadcasting/film/video in the Last Year: Never true    Ran Out of Food in the Last Year: Never true  Transportation Needs: Unknown (10/27/2024)   Epic    Lack of Transportation (Medical): Patient declined    Lack of Transportation (Non-Medical): No  Physical Activity: Unknown (10/27/2024)   Exercise Vital Sign    Days of Exercise per Week: Patient declined    Minutes of Exercise per Session: Not on file  Stress: Stress Concern Present (10/27/2024)   [employer] of Occupational Health - Occupational Stress Questionnaire    Feeling of Stress: Very much  Social Connections: Socially Integrated (10/27/2024)   Social Connection and Isolation Panel    Frequency of Communication with Friends and Family: More than three times a week    Frequency of Social Gatherings with Friends and Family: Patient declined    Attends Religious Services: More than 4 times per year    Active Member of [employer] or Organizations: Yes    Attends Engineer, Structural: More than 4 times per year    Marital Status: Married   Catering Manager Violence: Not At Risk (03/09/2023)   Humiliation, Afraid, Rape, and Kick questionnaire    Fear of Current or Ex-Partner: No    Emotionally Abused: No    Physically Abused: No    Sexually Abused: No  Depression (PHQ2-9): Medium Risk (11/14/2024)   Depression (PHQ2-9)    PHQ-2 Score: 8  Alcohol Screen: Not on file  Housing: Low Risk (10/27/2024)   Epic    Unable to Pay for Housing in the Last Year: No    Number of Times Moved in the Last Year: 0    Homeless in the Last Year: No  Utilities: At Risk (03/09/2023)   [facility] Utilities    Threatened with loss of utilities: Already shut off  Health Literacy: Not on file    Past Medical History, Surgical history, Social history, and Family history were reviewed and updated as appropriate.   Please see review of systems for further details on the patient's review from today.   Objective:   Physical Exam:  LMP 04/07/2024   Physical Exam Constitutional:      General: She is not in acute distress. Musculoskeletal:        General: No deformity.  Neurological:     Mental Status: She is alert and oriented to person, place, and time.     Coordination: Coordination normal.  Psychiatric:        Attention and Perception: Attention and perception normal. She does not perceive auditory or visual hallucinations.        Mood and Affect: Mood is anxious. Mood is not depressed or elated. Affect is not labile, angry, tearful or inappropriate.        Speech: Speech normal. Speech is not rapid and pressured.        Behavior: Behavior normal. Behavior is not agitated, hyperactive or combative.  Thought Content: Thought content is not paranoid or delusional. Thought content does not include homicidal or suicidal ideation. Thought content does not include suicidal plan.        Cognition and Memory: Cognition and memory normal.        Judgment: Judgment normal.     Comments: Insight intact No AIM  manic ongoing      Lab Review:      Component Value Date/Time   NA 134 (L) 05/16/2024 1000   NA 137 04/21/2022 1049   K 4.0 05/16/2024 1000   CL 102 05/16/2024 1000   CO2 23 05/16/2024 1000   GLUCOSE 102 (H) 05/16/2024 1000   BUN 11 05/16/2024 1000   BUN 10 04/21/2022 1049   CREATININE 0.91 05/16/2024 1000   CREATININE 0.67 09/18/2021 1125   CALCIUM 9.1 05/16/2024 1000   PROT 6.5 04/21/2022 1049   ALBUMIN 4.6 04/21/2022 1049   AST 19 04/21/2022 1049   ALT 10 04/21/2022 1049   ALKPHOS 78 04/21/2022 1049   BILITOT 0.4 04/21/2022 1049   GFRNONAA >60 05/16/2024 1000       Component Value Date/Time   WBC 6.1 05/16/2024 1000   RBC 4.71 05/16/2024 1000   HGB 14.7 05/16/2024 1000   HGB 14.5 04/21/2022 1049   HCT 43.2 05/16/2024 1000   HCT 42.6 04/21/2022 1049   PLT 338 05/16/2024 1000   PLT 338 04/21/2022 1049   MCV 91.7 05/16/2024 1000   MCV 92 04/21/2022 1049   MCH 31.2 05/16/2024 1000   MCHC 34.0 05/16/2024 1000   RDW 12.1 05/16/2024 1000   RDW 12.2 04/21/2022 1049   LYMPHSABS 1,995 09/18/2021 1125   MONOABS 570 03/20/2017 1100   EOSABS 251 09/18/2021 1125   BASOSABS 91 09/18/2021 1125    No results found for: POCLITH, LITHIUM   No results found for: PHENYTOIN, PHENOBARB, VALPROATE, CBMZ   .res Assessment: Plan:    There are no diagnoses linked to this encounter.   30 min face to face time. We discussed the following: Beth [name] has treatment resistant bipolar disorder with psychotic features versus schizoaffective disorder and other psychiatric diagnoses as well.  She has been very difficult to treat in part because she is very sensitive to antipsychotics and yet has strong history of chronic auditory hallucinations.    She is chronically anxious and depressed.    Chronic mood cycling is typical but worse.   Extensive history of psychiatric meds tried discussed with the patient.   Recently mainly manic and failed Latuda  160 mg .    TR insomnia chronically.  But less awakening at  present discussed Ambien  amnesia and side effects. No change in sleep meds this visit RLS managed right now. RLS managed and neuro checked iron studies reportedly ok.  Clozapine option.        No  Caffeine after noon.  This is helped.   continue Gabapentin  for off label anxiety and RLS at previous dosage.  400 BID and 800 pm.   Discussed the very high dose of recurrent ropinirole  and possible psychiatric side effects with that.  Might be causing mania but had rapid cycling before it.  However required to manage RLS   Discussed the risk of polypharmacy.  She is on multiple medications.  Disc purpose of each of the meds and consider changing some of them.  Undesirable polypharmacy but necessary bc chronic psych instability with history severe sx.she is benefitting and tolerating meds.  Counseled patient regarding potential benefits, risks, and  side effects of Lamictal  to include potential risk of Stevens-Johnson syndrome. Advised patient to stop taking Lamictal  and contact office immediately if rash develops and to seek urgent medical attention if rash is severe and/or spreading quickly.  Continue cyclobenzaprine  20 mg nightly for RLS resume doxazosin  6 mg nightly bc NM recurred off it. For RLS gabapentin  400 mg twice daily and 1200 mg nightly ropinirole  2 mg at 3 PM and 8 mg nightly. High dose necessary to control RLS Continue lorazepam  1 mg BID  and 2 tabs HS  After mania controlled consider weaning clonidine  if it is not helping any sx  Continue Latuda  80 mg for now.    Continue Lunesta  3 for longer duration vs Ambien .  Discussed side effects including amnesia. Continue hydroxyzine  25 mg HS if helps.  Stop if it doesn't.   Need counseling and started Rogelia Bolognese at [facility].  . Previously Discussed safety plan at length with patient.  Advised patient to contact office with any worsening signs and symptoms.  Instructed patient to go to the [facility] emergency room for  evaluation if experiencing any acute safety concerns, to include suicidal intent.  Discussed potential metabolic side effects associated with atypical antipsychotics, as well as potential risk for movement side effects. Advised pt to contact office if movement side effects occur.   She agrees with the plan  Rec talk with PCP about IV iron for RLS.  Gave copy of article outlining Am Acad of Sleep Med recommendations.   disc iron treatment recommended now for this which might allow weaning ropinirole .  Med changes:  Increase Lamictal  150 mg 2 daily, Stop Depakote  DT wt gain, though helped mania Resume doxazosin  for NM 3-6 mg HS like before Last reasonable mood stabilizer option for tR sx is Trileptal ;  300 mg tab, 1/2 nightly for 3 nights, then 1 nightly for 1 week, then 2 nightly.  Follow-up 4 weeks bc mania.  Call if sx worsen  Beth Macintosh MD, DFAPA Please see After Visit Summary for patient specific instructions.  Future Appointments  Date Time Provider Department Center  12/28/2024 10:40 AM [facility] 3 [facility]  02/06/2025 10:30 AM Prentiss Riggs A, NP [facility] None       No orders of the defined types were placed in this encounter.    -------------------------------

## 2024-12-19 ENCOUNTER — Telehealth: Payer: Self-pay | Admitting: Psychiatry

## 2024-12-20 NOTE — Telephone Encounter (Signed)
"  Rx sent  "

## 2024-12-20 NOTE — Telephone Encounter (Signed)
 Patient lvm at 11:51 requesting refill for Vistaril  25mg . Ph: [phone] Appt 2/5 [facility] [address] [place]

## 2024-12-23 ENCOUNTER — Other Ambulatory Visit: Payer: Self-pay | Admitting: Psychiatry

## 2024-12-23 DIAGNOSIS — G2581 Restless legs syndrome: Secondary | ICD-10-CM

## 2024-12-28 ENCOUNTER — Ambulatory Visit

## 2025-01-09 ENCOUNTER — Other Ambulatory Visit: Payer: Self-pay | Admitting: Psychiatry

## 2025-01-09 DIAGNOSIS — F5105 Insomnia due to other mental disorder: Secondary | ICD-10-CM

## 2025-01-09 DIAGNOSIS — F411 Generalized anxiety disorder: Secondary | ICD-10-CM

## 2025-01-09 DIAGNOSIS — F4310 Post-traumatic stress disorder, unspecified: Secondary | ICD-10-CM

## 2025-01-10 ENCOUNTER — Ambulatory Visit

## 2025-01-19 ENCOUNTER — Ambulatory Visit: Admitting: Psychiatry

## 2025-01-19 ENCOUNTER — Encounter: Payer: Self-pay | Admitting: Psychiatry

## 2025-01-19 DIAGNOSIS — G2581 Restless legs syndrome: Secondary | ICD-10-CM

## 2025-01-19 DIAGNOSIS — F5105 Insomnia due to other mental disorder: Secondary | ICD-10-CM

## 2025-01-19 DIAGNOSIS — F4310 Post-traumatic stress disorder, unspecified: Secondary | ICD-10-CM

## 2025-01-19 DIAGNOSIS — F4312 Post-traumatic stress disorder, chronic: Secondary | ICD-10-CM

## 2025-01-19 DIAGNOSIS — F315 Bipolar disorder, current episode depressed, severe, with psychotic features: Secondary | ICD-10-CM

## 2025-01-19 DIAGNOSIS — F411 Generalized anxiety disorder: Secondary | ICD-10-CM

## 2025-01-19 DIAGNOSIS — F3132 Bipolar disorder, current episode depressed, moderate: Secondary | ICD-10-CM

## 2025-01-19 MED ORDER — LURASIDONE HCL 80 MG PO TABS: 80.0000 mg | ORAL_TABLET | Freq: Two times a day (BID) | ORAL | 0 refills | Status: AC

## 2025-01-19 MED ORDER — LAMOTRIGINE 150 MG PO TABS: 300.0000 mg | ORAL_TABLET | Freq: Every day | ORAL | 0 refills | Status: AC

## 2025-01-19 MED ORDER — BUPROPION HCL ER (SR) 100 MG PO TB12: 100.0000 mg | ORAL_TABLET | Freq: Every day | ORAL | 0 refills | Status: AC

## 2025-01-19 NOTE — Progress Notes (Signed)
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 bedtime, no sertraline  for awhile.,  hydroxyzine  25 mg HS Mild manic sx , not gone and not bad.   She asks about increasing lamotrigine  and stop VPA bc can't handle the wt gain from it. Horrible dreams of dog that died; wants to resume doxazosin  bc it helped NM. NM waking her. problem with depakote  and wt gain. Plan: Med changes:  Increase Lamictal  150 mg 2 daily, Stop Depakote  DT wt gain, though helped mania Resume doxazosin  for NM 3-6 mg HS like before Last reasonable mood stabilizer option for tR sx is Trileptal ;  300 mg tab, 1/2 nightly for 3 nights, then 1 nightly for 1 week, then 2 nightly.  01/19/25 appt noted:  Med: only taking one oxcarbazepine  300 mg HS, Latuda  80 mg daily,  clonidine  0.2 mg BID, Lunesta  3 mg HS,gabapentin  800 mg AM and 1200 mg PM, lamotrigine  300, lorazepam  1 mg every 8 as needed, ropinirole  2 mg at 3 PM, ropinirole  8 mg at bedtime,  I don't like that new medicine.  Got a rash on her face and concerned about DDI with estrogen.   No motivation. Has a suggestion .  Mo takes Wellbutrin .  Wants to just stay on Latuda  even though it hasn't been adequate in efficacy.   No benefit increase lamotrigine  so far.  Still dep. No mania lately. No excess sleep.  Sleep 5 hours normal over the past year.    Prior psychiatric medication trials include  Latuda   Fanapt which cause restless legs,   Loxapine  weight,  Abilfiy 10 NM  Vraylar  Saphris  which caused weight gain & RLS, Liked saphris  some. olanzapine weight gain,  Geodon,  Seroquel  weight gain and restless legs, but worked risperidone restless legs,  Haloperidol  2 RLS,    loxapine  cost and weight,  Latuda  helped mood 80 good resp but then failed to manage mania at 160 mg daily [place] $ lithium wt gain,  lamotrigine ,  Equetro with mildly elevated liver enzymes, Depakote  retrial at [address], but did help mania  Sinemet , Mirapex , ropinirole  gabapentin .  Lyrica weight gain,   Lexapro, duloxetine, venlafaxine, bupropion , fluoxetine, sertraline ,    Wellbutrin  150 mania  Ambien , Restoril lost effect, mirtazapine increased appetite, trazodone restlessness,,doxepin ineffective,   cyclobenzaprine    04/18/20 appt with neuro about RLS.    Iron studies were normal.  The neurologist suggested referral to a movement disorder specialist.  We discussed at length that there are occasions in which going too high with a dopamine agonist can actually worsen restless legs rather than improve it.  She is at a high dose of ropinirole .  She does not want to go to see another doctor about this at this time.  Given that situation it was suggested that she try reducing the evening dose of ropinirole  to 6 mg from 8 mg given that her restless legs is only a problem in the evening before she goes to bed and not after.   M bipolar on Latuda  60 & lamotrigine   Review of Systems:  /Review of Systems  Cardiovascular:  Negative for palpitations.  Gastrointestinal:  Negative for nausea.  Neurological:  Negative for tremors.       RLS  Psychiatric/Behavioral:  Positive for dysphoric mood and sleep disturbance. Negative for agitation, behavioral problems, confusion and suicidal ideas. The patient is nervous/anxious. The patient is not hyperactive.     Medications: I have reviewed the patient's current medications.  Current Outpatient  Medications  Medication Sig Dispense Refill   acetaminophen  (TYLENOL ) 500 MG tablet Take 1,000 mg by mouth every 6 (six) hours as needed.     albuterol  (VENTOLIN  HFA) 108 (90 Base) MCG/ACT inhaler Inhale 2 puffs into the lungs every 6 (six) hours as needed for wheezing or shortness of breath. 8.5 g 3   BREZTRI  AEROSPHERE 160-9-4.8 MCG/ACT AERO inhaler INHALE 2 PUFFS INTO THE LUNGS TWICE DAILY (FIRST THING IN THE MORNING AND ABOUT 12 HOURS LATER) 3 each 3   buPROPion  ER (WELLBUTRIN  SR) 100 MG 12 hr tablet Take 1 tablet (100 mg total) by mouth daily. 30 tablet 0   cloNIDine  (CATAPRES ) 0.1 MG tablet TAKE 2 TABLETS TWICE DAILY 360 tablet 0   cyclobenzaprine  (FLEXERIL ) 10 MG tablet TAKE 2 TABLETS AT BEDTIME 180 tablet 3   docusate sodium  (COLACE) 100 MG capsule Take 1 capsule (100 mg total) by mouth 2 (two) times daily. 10 capsule 0   doxazosin  (CARDURA ) 2 MG tablet Take 2 tablets (4 mg total) by mouth daily. 180 tablet 0   estradiol  (DOTTI ) 0.0375 MG/24HR Place 1 patch onto the skin 2 (two) times a week. 8 patch 12   estradiol  (ESTRACE  VAGINAL) 0.1 MG/GM vaginal cream Rub pea size amount each night for 3 weeks then 3 times a week thereafter. 42.5 g 12   Eszopiclone  3 MG TABS TAKE 1  TABLET BY MOUTH ONCE NIGHTLY IMMEDIATELY BEFORE BEDTIME 30 tablet 0   fluticasone  (FLONASE ) 50 MCG/ACT nasal spray Place 2 sprays into both nostrils daily. 16 g 6   gabapentin  (NEURONTIN ) 400 MG capsule TAKE 1 CAPSULE TWICE DAILY AND TAKE 3 CAPSULES IN THE EVENING 450 capsule 3   hydrOXYzine  (VISTARIL ) 25 MG capsule TAKE 1 CAPSULE BY MOUTH AT BEDTIME 30 capsule 1   ibuprofen  (ADVIL ) 200 MG tablet Take 800 mg by mouth every 6 (six) hours as needed.     ibuprofen  (ADVIL ) 800 MG tablet Take 1 tablet (800 mg total) by mouth every 8 (eight) hours as needed. 30 tablet 1   LORazepam  (ATIVAN ) 1 MG tablet TAKE 1 TABLET BY MOUTH 2 TIMES A DAY AND 2 TABLETS AT NIGHT 120 tablet 0   metroNIDAZOLE  (FLAGYL ) 500 MG tablet Take 1 tablet  (500 mg total) by mouth 2 (two) times daily. 14 tablet 0   Multiple Vitamin (MULTIVITAMIN) tablet Take 1 tablet by mouth daily.     Naphazoline HCl (CLEAR EYES OP) Apply to eye as needed.     polyethylene glycol (MIRALAX ) 17 g packet Take 17 g by mouth daily. 14 each 0   Prasterone  (INTRAROSA ) 6.5 MG INST Place 1 suppository vaginally at bedtime as needed. 30 each 12   progesterone  (PROMETRIUM ) 100 MG capsule Take 1 capsule (100 mg total) by mouth at bedtime. 90 capsule 4   Psyllium (METAMUCIL PO) Take by mouth.     rOPINIRole  (REQUIP ) 2 MG tablet TAKE 1 TABLET AT 3PM 90 tablet 3   rOPINIRole  (REQUIP ) 4 MG tablet TAKE 2 TABLETS AT BEDTIME 180 tablet 3   Sodium Bicarbonate -Citric Acid (ALKA-SELTZER PO) Take by mouth as needed (heartburn/ regurg).     valACYclovir  (VALTREX ) 500 MG tablet Take one tablet by mouth twice daily for 3-5 days then daily as needed. 90 tablet 1   lamoTRIgine  (LAMICTAL ) 150 MG tablet Take 2 tablets (300 mg total) by mouth daily. 180 tablet 0   lurasidone  (LATUDA ) 80 MG TABS tablet Take 1 tablet (80 mg total) by mouth 2 (two) times daily. 180 tablet 0   No current facility-administered medications for this visit.    Medication Side Effects: nausea  Allergies: No Known Allergies  Past Medical History:  Diagnosis Date   Bipolar disorder with moderate depression (HCC)    psychiastry--- dr c. cottle   COPD (chronic obstructive pulmonary disease) (HCC)    (05-11-2024  pt denies sob w/ any actiivies )  pulmonary--- dr wert;   COPD likely GOLD 2/ AB, mild;  dyspnea w/ household chores   Cough variant asthma    Fracture    h/o ankle fracture   GAD (generalized anxiety disorder)    GERD (gastroesophageal reflux disease)    History of adenomatous polyp of colon    HSV (herpes simplex virus) anogenital infection 05/2018   Insomnia    Nightmares associated with chronic post-traumatic stress disorder    PTSD (post-traumatic stress disorder)    RLS (restless legs  syndrome)    Seasonal allergic rhinitis    Wears glasses     Family History  Problem Relation Age of Onset   Thyroid  disease Mother    COPD Mother    Bipolar disorder Mother    Anxiety disorder Mother    Depression Mother    Parkinson's disease Father    Bipolar disorder Sister    Breast cancer Paternal Grandmother 72   Schizophrenia Other    Colon cancer Neg Hx  Colon polyps Neg Hx    Esophageal cancer Neg Hx    Rectal cancer Neg Hx    Stomach cancer Neg Hx     Social History   Socioeconomic History   Marital status: Married    Spouse name: [name],[name]   Number of children: 0   Years of education: 16   Highest education level: Associate degree: occupational, scientist, product/process development, or vocational program  Occupational History   Occupation: unemployed   Occupation: disabled  Tobacco Use   Smoking status: Former    Types: Cigarettes   Smokeless tobacco: Never   Tobacco comments:    05-11-2024  pt stated quit smoking 2023,  started age 41  (41)  for 77 yrs  Vaping Use   Vaping status: Never Used  Substance and Sexual Activity   Alcohol use: Not Currently   Drug use: Never   Sexual activity: Yes    Partners: Male    Birth control/protection: Surgical    Comment: 1st intercourse 16 yo-5 partners, hysterectomy  Other Topics Concern   Not on file  Social History Narrative   Pt is right handed   Lives in single story home with her husband, mother and uncle   Has associated degree   Last employment: [employer]  /  Currently on disability.    Social Drivers of Health   Tobacco Use: Medium Risk (01/19/2025)   Patient History    Smoking Tobacco Use: Former    Smokeless Tobacco Use: Never    Passive Exposure: Not on file  Financial Resource Strain: Low Risk (10/27/2024)   Overall Financial Resource Strain (CARDIA)    Difficulty of Paying Living Expenses: Not hard at all  Food Insecurity: No Food Insecurity (10/27/2024)   Epic    Worried About Programme Researcher, Broadcasting/film/video in the Last  Year: Never true    Ran Out of Food in the Last Year: Never true  Transportation Needs: Unknown (10/27/2024)   Epic    Lack of Transportation (Medical): Patient declined    Lack of Transportation (Non-Medical): No  Physical Activity: Unknown (10/27/2024)   Exercise Vital Sign    Days of Exercise per Week: Patient declined    Minutes of Exercise per Session: Not on file  Stress: Stress Concern Present (10/27/2024)   [employer] of Occupational Health - Occupational Stress Questionnaire    Feeling of Stress: Very much  Social Connections: Socially Integrated (10/27/2024)   Social Connection and Isolation Panel    Frequency of Communication with Friends and Family: More than three times a week    Frequency of Social Gatherings with Friends and Family: Patient declined    Attends Religious Services: More than 4 times per year    Active Member of [employer] or Organizations: Yes    Attends Engineer, Structural: More than 4 times per year    Marital Status: Married  Catering Manager Violence: Not At Risk (03/09/2023)   Humiliation, Afraid, Rape, and Kick questionnaire    Fear of Current or Ex-Partner: No    Emotionally Abused: No    Physically Abused: No    Sexually Abused: No  Depression (PHQ2-9): Medium Risk (11/14/2024)   Depression (PHQ2-9)    PHQ-2 Score: 8  Alcohol Screen: Not on file  Housing: Low Risk (10/27/2024)   Epic    Unable to Pay for Housing in the Last Year: No    Number of Times Moved in the Last Year: 0    Homeless in the Last Year: No  Utilities: At Risk (03/09/2023)   [facility] Utilities    Threatened with loss of utilities: Already shut off  Health Literacy: Not on file    Past Medical History, Surgical history, Social history, and Family history were reviewed and updated as appropriate.   Please see review of systems for further details on the patient's review from today.   Objective:   Physical Exam:  LMP 04/07/2024   Physical  Exam Constitutional:      General: She is not in acute distress. Musculoskeletal:        General: No deformity.  Neurological:     Mental Status: She is alert and oriented to person, place, and time.     Coordination: Coordination normal.  Psychiatric:        Attention and Perception: Attention and perception normal. She does not perceive auditory or visual hallucinations.        Mood and Affect: Mood is anxious and depressed. Mood is not elated. Affect is not labile, angry, tearful or inappropriate.        Speech: Speech normal. Speech is not rapid and pressured.        Behavior: Behavior normal. Behavior is not agitated, hyperactive or combative.        Thought Content: Thought content is not paranoid or delusional. Thought content does not include homicidal or suicidal ideation. Thought content does not include suicidal plan.        Cognition and Memory: Cognition and memory normal.        Judgment: Judgment normal.     Comments: Insight intact No AIM       Lab Review:     Component Value Date/Time   NA 134 (L) 05/16/2024 1000   NA 137 04/21/2022 1049   K 4.0 05/16/2024 1000   CL 102 05/16/2024 1000   CO2 23 05/16/2024 1000   GLUCOSE 102 (H) 05/16/2024 1000   BUN 11 05/16/2024 1000   BUN 10 04/21/2022 1049   CREATININE 0.91 05/16/2024 1000   CREATININE 0.67 09/18/2021 1125   CALCIUM 9.1 05/16/2024 1000   PROT 6.5 04/21/2022 1049   ALBUMIN 4.6 04/21/2022 1049   AST 19 04/21/2022 1049   ALT 10 04/21/2022 1049   ALKPHOS 78 04/21/2022 1049   BILITOT 0.4 04/21/2022 1049   GFRNONAA >60 05/16/2024 1000       Component Value Date/Time   WBC 6.1 05/16/2024 1000   RBC 4.71 05/16/2024 1000   HGB 14.7 05/16/2024 1000   HGB 14.5 04/21/2022 1049   HCT 43.2 05/16/2024 1000   HCT 42.6 04/21/2022 1049   PLT 338 05/16/2024 1000   PLT 338 04/21/2022 1049   MCV 91.7 05/16/2024 1000   MCV 92 04/21/2022 1049   MCH 31.2 05/16/2024 1000   MCHC 34.0 05/16/2024 1000   RDW 12.1  05/16/2024 1000   RDW 12.2 04/21/2022 1049   LYMPHSABS 1,995 09/18/2021 1125   MONOABS 570 03/20/2017 1100   EOSABS 251 09/18/2021 1125   BASOSABS 91 09/18/2021 1125    No results found for: POCLITH, LITHIUM   No results found for: PHENYTOIN, PHENOBARB, VALPROATE, CBMZ   .res Assessment: Plan:    Chantil was seen today for follow-up, manic behavior, depression, medication reaction and sleeping problem.  Diagnoses and all orders for this visit:  Bipolar disorder with moderate depression (HCC) -     buPROPion  ER (WELLBUTRIN  SR) 100 MG 12 hr tablet; Take 1 tablet (100 mg total) by mouth daily. -  lurasidone  (LATUDA ) 80 MG TABS tablet; Take 1 tablet (80 mg total) by mouth 2 (two) times daily.  PTSD (post-traumatic stress disorder) -     buPROPion  ER (WELLBUTRIN  SR) 100 MG 12 hr tablet; Take 1 tablet (100 mg total) by mouth daily.  Generalized anxiety disorder  Insomnia due to other mental disorder  RLS (restless legs syndrome)  Nightmares associated with chronic post-traumatic stress disorder  Bipolar disorder, current episode depressed, severe, with psychotic features (HCC) -     lamoTRIgine  (LAMICTAL ) 150 MG tablet; Take 2 tablets (300 mg total) by mouth daily.   30 min face to face time. We discussed the following: Beth [name] has treatment resistant bipolar disorder with psychotic features versus schizoaffective disorder and other psychiatric diagnoses as well.  She has been very difficult to treat in part because she is very sensitive to antipsychotics and yet has strong history of chronic auditory hallucinations.    She is chronically anxious and depressed.    Chronic mood cycling is typical, now complaining of dep.    Extensive history of psychiatric meds tried discussed with the patient.   Recently mainly manic and failed Latuda  160 mg .   Wants to retry Wellbutrin  150 for dep, motivation, wt loss but informed her reports of mania in past on it.  July 2024 but at  that time was also on sertraline . Disc risk again but will try lower dose.   TR insomnia chronically.  But less awakening at present discussed Ambien  amnesia and side effects. No change in sleep meds this visit RLS managed right now. RLS managed and neuro checked iron studies reportedly ok.  Clozapine option.        No  Caffeine after noon.  This is helped.   continue Gabapentin  for off label anxiety and RLS at previous dosage.  400 BID and 800 pm.   Discussed the very high dose of recurrent ropinirole  and possible psychiatric side effects with that.  Might be causing mania but had rapid cycling before it.  However required to manage RLS   Discussed the risk of polypharmacy.  She is on multiple medications.  Disc purpose of each of the meds and consider changing some of them.  Undesirable polypharmacy but necessary bc chronic psych instability with history severe sx.she is benefitting and tolerating meds.  Counseled patient regarding potential benefits, risks, and side effects of Lamictal  to include potential risk of Stevens-Johnson syndrome. Advised patient to stop taking Lamictal  and contact office immediately if rash develops and to seek urgent medical attention if rash is severe and/or spreading quickly.  Continue cyclobenzaprine  20 mg nightly for RLS resume doxazosin  6 mg nightly bc NM recurred off it. For RLS gabapentin  400 mg twice daily and 1200 mg nightly ropinirole  2 mg at 3 PM and 8 mg nightly. High dose necessary to control RLS Continue lorazepam  1 mg BID  and 2 tabs HS  After mania controlled consider weaning clonidine  if it is not helping any sx  Continue Latuda  80 mg for now.   She agrees to retry lower dose Seroquel  XR 1/2 of 300 mg HS  Continue Lunesta  3 for longer duration vs Ambien .  Discussed side effects including amnesia. Continue hydroxyzine  25 mg HS if helps.  Stop if it doesn't.   Need counseling and started Rogelia Bolog[facility]ese at [facility].   . Previously Discussed safety plan at length with patient.  Advised patient to contact office with any worsening signs and symptoms.  Instructed patient to go to  the [facility] emergency room for evaluation if experiencing any acute safety concerns, to include suicidal intent.  Discussed potential metabolic side effects associated with atypical antipsychotics, as well as potential risk for movement side effects. Advised pt to contact office if movement side effects occur.   She agrees with the plan  Rec talk with PCP about IV iron for RLS.  Gave copy of article outlining Am Acad of Sleep Med recommendations.   disc iron treatment recommended now for this which might allow weaning ropinirole .   Resume doxazosin  for NM 3-6 mg HS like before She's aware we have tried all the typical options woith lower wt gain risk  Follow-up 4 weeks bc mania.  Call if sx worsen  Lorene Macintosh MD, DFAPA Please see After Visit Summary for patient specific instructions.  Future Appointments  Date Time Provider Department Center  01/24/2025 11:50 AM [facility] 3 [facility]  02/06/2025 10:30 AM Prentiss Riggs A, NP [facility] None       No orders of the defined types were placed in this encounter.    -------------------------------

## 2025-01-24 ENCOUNTER — Ambulatory Visit

## 2025-02-06 ENCOUNTER — Ambulatory Visit: Admitting: Nurse Practitioner

## 2025-03-20 ENCOUNTER — Ambulatory Visit: Admitting: Psychiatry
